# Patient Record
Sex: Female | Born: 1950
Health system: Southern US, Community
[De-identification: ages and names within clinical notes are randomized; demographics above are authoritative.]

## PROBLEM LIST (undated history)

## (undated) DIAGNOSIS — E079 Disorder of thyroid, unspecified: Secondary | ICD-10-CM

## (undated) DIAGNOSIS — R51 Headache: Secondary | ICD-10-CM

## (undated) DIAGNOSIS — L111 Transient acantholytic dermatosis [Grover]: Secondary | ICD-10-CM

## (undated) DIAGNOSIS — F32A Depression, unspecified: Secondary | ICD-10-CM

## (undated) DIAGNOSIS — I214 Non-ST elevation (NSTEMI) myocardial infarction: Secondary | ICD-10-CM

## (undated) DIAGNOSIS — R0600 Dyspnea, unspecified: Secondary | ICD-10-CM

## (undated) DIAGNOSIS — C801 Malignant (primary) neoplasm, unspecified: Secondary | ICD-10-CM

## (undated) DIAGNOSIS — M1990 Unspecified osteoarthritis, unspecified site: Secondary | ICD-10-CM

## (undated) DIAGNOSIS — J189 Pneumonia, unspecified organism: Secondary | ICD-10-CM

## (undated) DIAGNOSIS — T7840XA Allergy, unspecified, initial encounter: Secondary | ICD-10-CM

## (undated) DIAGNOSIS — R519 Headache, unspecified: Secondary | ICD-10-CM

## (undated) DIAGNOSIS — Z8669 Personal history of other diseases of the nervous system and sense organs: Secondary | ICD-10-CM

## (undated) DIAGNOSIS — K5901 Slow transit constipation: Secondary | ICD-10-CM

## (undated) DIAGNOSIS — F419 Anxiety disorder, unspecified: Secondary | ICD-10-CM

## (undated) DIAGNOSIS — F329 Major depressive disorder, single episode, unspecified: Secondary | ICD-10-CM

## (undated) DIAGNOSIS — I2510 Atherosclerotic heart disease of native coronary artery without angina pectoris: Secondary | ICD-10-CM

## (undated) DIAGNOSIS — G2581 Restless legs syndrome: Secondary | ICD-10-CM

## (undated) DIAGNOSIS — D649 Anemia, unspecified: Secondary | ICD-10-CM

## (undated) HISTORY — DX: Allergy, unspecified, initial encounter: T78.40XA

## (undated) HISTORY — PX: BREAST SURGERY: SHX581

## (undated) HISTORY — PX: AUGMENTATION MAMMAPLASTY: SUR837

## (undated) HISTORY — DX: Depression, unspecified: F32.A

## (undated) HISTORY — PX: CORONARY ANGIOPLASTY: SHX604

## (undated) HISTORY — PX: APPENDECTOMY: SHX54

## (undated) HISTORY — DX: Transient acantholytic dermatosis (grover): L11.1

## (undated) HISTORY — DX: Personal history of other diseases of the nervous system and sense organs: Z86.69

## (undated) HISTORY — PX: BACK SURGERY: SHX140

## (undated) HISTORY — DX: Major depressive disorder, single episode, unspecified: F32.9

## (undated) HISTORY — DX: Disorder of thyroid, unspecified: E07.9

## (undated) HISTORY — PX: COLONOSCOPY: SHX174

---

## 2008-04-27 ENCOUNTER — Encounter: Admission: RE | Admit: 2008-04-27 | Discharge: 2008-04-27 | Payer: Self-pay | Admitting: Sports Medicine

## 2008-05-10 ENCOUNTER — Encounter: Admission: RE | Admit: 2008-05-10 | Discharge: 2008-05-10 | Payer: Self-pay | Admitting: Sports Medicine

## 2008-09-21 ENCOUNTER — Encounter: Admission: RE | Admit: 2008-09-21 | Discharge: 2008-09-21 | Payer: Self-pay | Admitting: Sports Medicine

## 2009-05-27 ENCOUNTER — Encounter: Admission: RE | Admit: 2009-05-27 | Discharge: 2009-05-27 | Payer: Self-pay | Admitting: Orthopedic Surgery

## 2009-06-10 ENCOUNTER — Encounter: Admission: RE | Admit: 2009-06-10 | Discharge: 2009-06-10 | Payer: Self-pay | Admitting: Orthopedic Surgery

## 2009-08-24 ENCOUNTER — Encounter: Admission: RE | Admit: 2009-08-24 | Discharge: 2009-08-24 | Payer: Self-pay | Admitting: Orthopedic Surgery

## 2010-03-02 ENCOUNTER — Encounter: Admission: RE | Admit: 2010-03-02 | Discharge: 2010-03-02 | Payer: Self-pay | Admitting: Orthopedic Surgery

## 2011-02-08 ENCOUNTER — Other Ambulatory Visit: Payer: Self-pay | Admitting: Orthopedic Surgery

## 2011-02-08 DIAGNOSIS — M545 Low back pain: Secondary | ICD-10-CM

## 2011-02-13 ENCOUNTER — Other Ambulatory Visit: Payer: Self-pay

## 2011-02-14 ENCOUNTER — Ambulatory Visit
Admission: RE | Admit: 2011-02-14 | Discharge: 2011-02-14 | Disposition: A | Discharge status: Home / Self Care | Payer: BC Managed Care – PPO | Source: Ambulatory Visit | Attending: Orthopedic Surgery | Admitting: Orthopedic Surgery

## 2011-02-14 DIAGNOSIS — M545 Low back pain: Secondary | ICD-10-CM

## 2011-02-27 ENCOUNTER — Other Ambulatory Visit: Payer: Self-pay | Admitting: Orthopedic Surgery

## 2011-02-27 DIAGNOSIS — M545 Low back pain: Secondary | ICD-10-CM

## 2011-03-05 ENCOUNTER — Ambulatory Visit
Admission: RE | Admit: 2011-03-05 | Discharge: 2011-03-05 | Disposition: A | Discharge status: Home / Self Care | Payer: BC Managed Care – PPO | Source: Ambulatory Visit | Attending: Orthopedic Surgery | Admitting: Orthopedic Surgery

## 2011-03-05 DIAGNOSIS — M545 Low back pain: Secondary | ICD-10-CM

## 2011-10-26 ENCOUNTER — Ambulatory Visit: Payer: BC Managed Care – PPO | Admitting: Family Medicine

## 2011-11-08 ENCOUNTER — Ambulatory Visit (INDEPENDENT_AMBULATORY_CARE_PROVIDER_SITE_OTHER): Payer: BC Managed Care – PPO | Admitting: Family Medicine

## 2011-11-08 ENCOUNTER — Encounter: Payer: Self-pay | Admitting: Family Medicine

## 2011-11-08 ENCOUNTER — Other Ambulatory Visit (HOSPITAL_COMMUNITY)
Admission: RE | Admit: 2011-11-08 | Discharge: 2011-11-08 | Disposition: A | Discharge status: Home / Self Care | Payer: BC Managed Care – PPO | Source: Ambulatory Visit | Attending: Family Medicine | Admitting: Family Medicine

## 2011-11-08 VITALS — BP 136/90 | Temp 98.2°F | Wt 152.0 lb

## 2011-11-08 DIAGNOSIS — Z01419 Encounter for gynecological examination (general) (routine) without abnormal findings: Secondary | ICD-10-CM

## 2011-11-08 DIAGNOSIS — Z0000 Encounter for general adult medical examination without abnormal findings: Secondary | ICD-10-CM

## 2011-11-08 DIAGNOSIS — F329 Major depressive disorder, single episode, unspecified: Secondary | ICD-10-CM

## 2011-11-08 DIAGNOSIS — E270 Other adrenocortical overactivity: Secondary | ICD-10-CM

## 2011-11-08 DIAGNOSIS — F32A Depression, unspecified: Secondary | ICD-10-CM

## 2011-11-08 DIAGNOSIS — F419 Anxiety disorder, unspecified: Secondary | ICD-10-CM

## 2011-11-08 DIAGNOSIS — F411 Generalized anxiety disorder: Secondary | ICD-10-CM

## 2011-11-08 NOTE — Progress Notes (Signed)
Subjective:    Patient ID: Monique Miller, female    DOB: 25-Nov-1950, 60 y.o.   MRN: [id number]  HPI  This is a routine physical examination for this healthy  Female. Reviewed all health maintenance protocols including mammography colonoscopy bone density and reviewed appropriate screening labs. Her immunization history was reviewed as well as her current medications and allergies refills of her chronic medications were given and the plan for yearly health maintenance was discussed all orders and referrals were made as appropriate.   Review of Systems  Genitourinary:       Vaginal dryness  Skin:       Florian Buff disease  Psychiatric/Behavioral: The patient is nervous/anxious.   All other systems reviewed and are negative.   Past Medical History  Diagnosis Date  . Depression     see [facility], production and Colen Darling  . Allergy   . Hx of migraines   . Thyroid disease     hx of hypothyroid  . Grover's disease     History   Social History  . Marital Status: Married    Spouse Name: N/A    Number of Children: N/A  . Years of Education: N/A   Occupational History  . Not on file.   Social History Main Topics  . Smoking status: Never Smoker   . Smokeless tobacco: Not on file  . Alcohol Use: Yes     occassionally  . Drug Use: No  . Sexually Active: Not on file   Other Topics Concern  . Not on file   Social History Narrative  . No narrative on file    Past Surgical History  Procedure Date  . Cesarean section     x2  . Appendectomy   . Breast surgery     augmentation    Family History  Problem Relation Age of Onset  . Cancer Mother     pancreatic  . Cancer Maternal Uncle     pancreatic  . Cancer Maternal Grandmother     pancreatic    No Known Allergies  No current outpatient prescriptions on file prior to visit.    BP 136/90  Temp(Src) 98.2 F (36.8 C) (Oral)  Wt 152 lb (68.947 kg)chart     Objective:   Physical Exam  Constitutional: She is oriented  to person, place, and time. She appears well-developed and well-nourished.  HENT:  Head: Normocephalic and atraumatic.  Left Ear: External ear normal.  Nose: Nose normal.  Mouth/Throat: Oropharynx is clear and moist.  Eyes: Conjunctivae and EOM are normal. Pupils are equal, round, and reactive to light.  Neck: Normal range of motion. Neck supple.  Cardiovascular: Normal rate, regular rhythm, normal heart sounds and intact distal pulses.   Pulmonary/Chest: Effort normal and breath sounds normal.  Abdominal: Soft. Bowel sounds are normal.  Genitourinary: Vagina normal and uterus normal. Guaiac negative stool.  Musculoskeletal: Normal range of motion.  Neurological: She is alert and oriented to person, place, and time. She has normal reflexes.  Skin: Skin is warm and dry.  Psychiatric: She has a normal mood and affect.          Assessment & Plan:  Assessment: Complete physical exam, anxiety, Pap smear, Schroeder's disease  Plan: Patient scheduled for mammogram. Pap smear completed and sent. Fasting labs to be obtained in the morning to include CMP, lipids, CBC, and TSH. Colonoscopy was about 5 years ago. She is to return in 5 years. Bone density scan ordered. Encouraged healthy diet and  exercise, self breast exams, uses seatbelts, and handling safety. Patient will return for recheck in 3-4 months and sooner when necessary

## 2011-11-08 NOTE — Patient Instructions (Signed)
1. Return tomorrow for fasting labs. Atrophic Vaginitis Atrophic vaginitis is a problem of low levels of estrogen in women. This problem can happen at any age. It is most common in women who have gone through menopause ("the change").  HOW WILL I KNOW IF I HAVE THIS PROBLEM? You may have:  Trouble with peeing (urinating), such as:   Going to the bathroom often.   A hard time holding your pee until you reach a bathroom.   Leaking pee.   Having pain when you pee.   Itching or a burning feeling.   Vaginal bleeding and spotting.   Pain during sex.   Dryness of the vagina.   A yellow, bad-smelling fluid (discharge) coming from the vagina.  HOW WILL MY DOCTOR CHECK FOR THIS PROBLEM?  During your exam, your doctor will likely find the problem.   If there is a vaginal fluid, it may be checked for infection.  HOW WILL THIS PROBLEM BE TREATED? Keep the vulvar skin as clean as possible. Moisturizers and lubricants can help with some of the symptoms. Estrogen replacement can help. There are 2 ways to take estrogen:  Systemic estrogen gets estrogen to your whole body. It takes many weeks or months before the symptoms get better.   You take an estrogen pill.   You use a skin patch. This is a patch that you put on your skin.   If you still have your uterus, your doctor may ask you to take a hormone. Talk to your doctor about the right medicine for you.   Estrogen cream.  This puts estrogen only at the part of your body where you apply it. The cream is put into the vagina or put on the vulvar skin. For some women, estrogen cream works faster than pills or the patch.  CAN ALL WOMEN WITH THIS PROBLEM USE ESTROGEN? No. Women with certain types of cancer, liver problems, or problems with blood clots should not take estrogen. Your doctor can help you decide the best treatment for your symptoms. Document Released: 04/16/2008 Document Revised: 07/12/2011 Document Reviewed: 04/16/2008 [facility]  Patient Information 2012 [place], [place].

## 2011-11-09 ENCOUNTER — Other Ambulatory Visit: Payer: BC Managed Care – PPO

## 2011-11-09 ENCOUNTER — Ambulatory Visit (INDEPENDENT_AMBULATORY_CARE_PROVIDER_SITE_OTHER)
Admission: RE | Admit: 2011-11-09 | Discharge: 2011-11-09 | Disposition: A | Discharge status: Home / Self Care | Payer: BC Managed Care – PPO | Source: Ambulatory Visit

## 2011-11-09 DIAGNOSIS — F329 Major depressive disorder, single episode, unspecified: Secondary | ICD-10-CM

## 2011-11-09 DIAGNOSIS — F419 Anxiety disorder, unspecified: Secondary | ICD-10-CM

## 2011-11-09 DIAGNOSIS — E270 Other adrenocortical overactivity: Secondary | ICD-10-CM

## 2011-11-09 DIAGNOSIS — M810 Age-related osteoporosis without current pathological fracture: Secondary | ICD-10-CM

## 2011-11-09 DIAGNOSIS — Z0000 Encounter for general adult medical examination without abnormal findings: Secondary | ICD-10-CM

## 2011-11-09 LAB — COMPREHENSIVE METABOLIC PANEL
Albumin: 4.1 g/dL (ref 3.5–5.2)
CO2: 29 mEq/L (ref 19–32)
Calcium: 8.7 mg/dL (ref 8.4–10.5)
Chloride: 101 mEq/L (ref 96–112)
GFR: 52.01 mL/min — ABNORMAL LOW (ref >60.00)
Glucose, Bld: 80 mg/dL (ref 70–99)
Potassium: 4.7 mEq/L (ref 3.5–5.1)

## 2011-11-09 LAB — POCT URINALYSIS DIPSTICK
Bilirubin, UA: NEGATIVE
Blood, UA: NEGATIVE
Ketones, UA: NEGATIVE
Protein, UA: NEGATIVE
Spec Grav, UA: 1.01
Urobilinogen, UA: 0.2
pH, UA: 6.5

## 2011-11-09 LAB — LIPID PANEL: HDL: 68.7 mg/dL (ref >39.00)

## 2011-11-09 LAB — CBC WITH DIFFERENTIAL/PLATELET
Basophils Relative: 0.5 % (ref 0.0–3.0)
HCT: 38.8 % (ref 36.0–46.0)
Hemoglobin: 12.8 g/dL (ref 12.0–15.0)
Lymphs Abs: 3.9 10*3/uL (ref 0.7–4.0)
Monocytes Relative: 9.4 % (ref 3.0–12.0)
Neutro Abs: 4.8 10*3/uL (ref 1.4–7.7)
Neutrophils Relative %: 48.5 % (ref 43.0–77.0)
WBC: 9.9 10*3/uL (ref 4.5–10.5)

## 2011-11-09 LAB — TSH: TSH: 3.39 u[IU]/mL (ref 0.35–5.50)

## 2011-11-09 NOTE — Progress Notes (Signed)
Addended by: Bonnye Fava on: 11/09/2011 08:47 AM   Modules accepted: Orders

## 2011-11-23 ENCOUNTER — Encounter: Payer: Self-pay | Admitting: Family

## 2011-11-26 ENCOUNTER — Telehealth: Payer: Self-pay | Admitting: *Deleted

## 2011-11-26 NOTE — Telephone Encounter (Signed)
Please call pt with all her lab results from December, please.

## 2011-11-27 NOTE — Telephone Encounter (Signed)
Please call with her lab results

## 2011-11-28 ENCOUNTER — Telehealth: Payer: Self-pay | Admitting: Family

## 2011-11-28 ENCOUNTER — Telehealth: Payer: Self-pay | Admitting: *Deleted

## 2011-11-28 NOTE — Telephone Encounter (Signed)
Pt aware and verbalized understanding.  

## 2011-11-28 NOTE — Telephone Encounter (Signed)
Pt is calling back for all her lab and bone scan results again.

## 2011-11-28 NOTE — Telephone Encounter (Signed)
Per Oran Rein, advise pt labs are stable. Pt should focus on healthy diet and exercise and f/u for CPX in 1 year.  Pt aware and verbalized understanding. Copy of labs mailed to pt.  Pt requesting DEXA results. Will have Padonda review report

## 2011-11-28 NOTE — Telephone Encounter (Signed)
Done

## 2011-11-28 NOTE — Telephone Encounter (Signed)
Patient is requesting DEXA scan results. She is considered Osteopenic. Starts 800units of Vitamin D and 1500mg  of Calcium. Exercise daily.

## 2011-12-26 ENCOUNTER — Ambulatory Visit (INDEPENDENT_AMBULATORY_CARE_PROVIDER_SITE_OTHER): Payer: BC Managed Care – PPO | Admitting: Family

## 2011-12-26 ENCOUNTER — Telehealth: Payer: Self-pay

## 2011-12-26 ENCOUNTER — Encounter: Payer: Self-pay | Admitting: Family

## 2011-12-26 VITALS — BP 136/90 | HR 83 | Temp 100.0°F | Wt 152.0 lb

## 2011-12-26 DIAGNOSIS — R0982 Postnasal drip: Secondary | ICD-10-CM

## 2011-12-26 DIAGNOSIS — J019 Acute sinusitis, unspecified: Secondary | ICD-10-CM

## 2011-12-26 MED ORDER — ESTRADIOL 0.1 MG/GM VA CREA: TOPICAL_CREAM | VAGINAL | Status: DC

## 2011-12-26 MED ORDER — AMOXICILLIN-POT CLAVULANATE 875-125 MG PO TABS: 1.0000 | ORAL_TABLET | Freq: Two times a day (BID) | ORAL | Status: AC

## 2011-12-26 NOTE — Patient Instructions (Signed)

## 2011-12-26 NOTE — Telephone Encounter (Signed)
Spoke with pharmacist at [facility]. Pt profile indicated that she received estrace vaginal cream to be applied at bedtime twice weekly. Verbal refill request given

## 2011-12-26 NOTE — Progress Notes (Signed)
Subjective:    Patient ID: Monique Miller, female    DOB: 1951-09-23, 61 y.o.   MRN: [id number]  HPI 61 year old white female, nonsmoker, presents today with complaints of fever, chills, nasal congestion, headache, sneezing and runny nose has been going on for 2 weeks, and worsening. She taken over-the-counter Tylenol Cold and sinus that is no longer helping her symptoms. She denies any lightheadedness, dizziness, chest pain, palpitations, shortness of breath or edema.  Patient is requesting a refill on medication for vaginal dryness. Medication works well.   Review of Systems  Constitutional: Positive for fever, chills and fatigue.  HENT: Positive for congestion, rhinorrhea, sneezing, postnasal drip and sinus pressure.   Eyes: Negative.   Respiratory: Positive for cough.   Cardiovascular: Negative.   Gastrointestinal: Negative.   Musculoskeletal: Negative.   Skin: Negative.   Neurological: Negative.   Hematological: Negative.   Psychiatric/Behavioral: Negative.        Past Medical History  Diagnosis Date  . Depression     see [facility], production and Colen Darling  . Allergy   . Hx of migraines   . Thyroid disease     hx of hypothyroid  . Grover's disease     History   Social History  . Marital Status: Married    Spouse Name: N/A    Number of Children: N/A  . Years of Education: N/A   Occupational History  . Not on file.   Social History Main Topics  . Smoking status: Never Smoker   . Smokeless tobacco: Not on file  . Alcohol Use: Yes     occassionally  . Drug Use: No  . Sexually Active: Not on file   Other Topics Concern  . Not on file   Social History Narrative  . No narrative on file    Past Surgical History  Procedure Date  . Cesarean section     x2  . Appendectomy   . Breast surgery     augmentation    Family History  Problem Relation Age of Onset  . Cancer Mother     pancreatic  . Cancer Maternal Uncle     pancreatic  . Cancer Maternal  Grandmother     pancreatic    No Known Allergies  Current Outpatient Prescriptions on File Prior to Visit  Medication Sig Dispense Refill  . ALPRAZolam (XANAX) 1 MG tablet Take 1 mg by mouth at bedtime as needed.       Marland Kitchen amphetamine-dextroamphetamine (ADDERALL) 15 MG tablet Take by mouth. 1 tablet 2-3 times daily      . buPROPion (WELLBUTRIN XL) 300 MG 24 hr tablet Take 300 mg by mouth daily.       Marland Kitchen CORDRAN 0.05 % lotion Apply topically 2 (two) times daily.       . CYMBALTA 60 MG capsule Take 60 mg by mouth daily.       Marland Kitchen glucosamine-chondroitin 500-400 MG tablet Take 1 tablet by mouth 2 (two) times daily.        . hydrOXYzine (ATARAX/VISTARIL) 25 MG tablet Take 25 mg by mouth at bedtime.       . predniSONE (DELTASONE) 10 MG tablet         BP 136/90  Pulse 83  Temp(Src) 100 F (37.8 C) (Oral)  Wt 152 lb (68.947 kg)  SpO2 98%chart Objective:   Physical Exam  Constitutional: She is oriented to person, place, and time. She appears well-developed and well-nourished.  HENT:  Right Ear: External ear normal.  Left Ear: External ear normal.  Nose: Nose normal.  Mouth/Throat: Oropharynx is clear and moist.       Maxillary sinus tenderness to palpation.  Neck: Normal range of motion. Neck supple.  Cardiovascular: Normal rate, regular rhythm and normal heart sounds.   Pulmonary/Chest: Effort normal and breath sounds normal.  Musculoskeletal: Normal range of motion.  Neurological: She is alert and oriented to person, place, and time.  Skin: Skin is warm and dry.  Psychiatric: She has a normal mood and affect.          Assessment & Plan:  Assessment: Acute sinusitis, vaginal dryness  Plan: We'll call the pharmacy and find out what the medication is that she had taken in the past for vaginal dryness of her new it. Augmentin 875 one by mouth twice a day x10 days. Claritin over-the-counter daily. On the opposite symptoms worsen or persist. Recheck as scheduled and when necessary.

## 2012-05-08 ENCOUNTER — Telehealth: Payer: Self-pay | Admitting: Family

## 2012-05-08 NOTE — Telephone Encounter (Signed)
Caller: Edy/Patient; PCP: Adline Mango; CB#: [phone];  Call regarding Urinary Pain/Frequency.  Onset: 05/06/12.  Afebrile.  Menopausal.  May use otc Azo Standard or equilivant.  Advised to see MD within 24 hrs for one or more urinary tract symptoms and has not been evaluated per Urinary Symptoms Guideline.  Appt scheduled for 0800 05/09/12 with Dr Amador Cunas.

## 2012-05-08 NOTE — Telephone Encounter (Signed)
noted 

## 2012-05-09 ENCOUNTER — Encounter: Payer: Self-pay | Admitting: Internal Medicine

## 2012-05-09 ENCOUNTER — Ambulatory Visit (INDEPENDENT_AMBULATORY_CARE_PROVIDER_SITE_OTHER): Payer: BC Managed Care – PPO | Admitting: Internal Medicine

## 2012-05-09 VITALS — BP 110/70 | Temp 98.2°F | Wt 150.0 lb

## 2012-05-09 DIAGNOSIS — N390 Urinary tract infection, site not specified: Secondary | ICD-10-CM

## 2012-05-09 DIAGNOSIS — R300 Dysuria: Secondary | ICD-10-CM

## 2012-05-09 LAB — POCT URINALYSIS DIPSTICK
Blood, UA: 3
Nitrite, UA: POSITIVE
Spec Grav, UA: 1.02
Urobilinogen, UA: 2
pH, UA: 7.5

## 2012-05-09 MED ORDER — NITROFURANTOIN MONOHYD MACRO 100 MG PO CAPS: 100.0000 mg | ORAL_CAPSULE | Freq: Two times a day (BID) | ORAL | Status: AC

## 2012-05-09 NOTE — Progress Notes (Signed)
  Subjective:    Patient ID: Monique Miller, female    DOB: [birth year], 61 y.o.   MRN: [id number]  HPI  61 year old patient who presents with a three-day history of burning dysuria or frequency. She has felt a bit unwell with some low back pain and a sense of a low-grade fever. No flank pain or frank chills. She has been using over-the-counter azo  without much benefit. No recent antibiotic use    Review of Systems  Genitourinary: Positive for dysuria, urgency, frequency and difficulty urinating. Negative for hematuria, flank pain and genital sores.       Objective:   Physical Exam  Constitutional: She appears well-developed and well-nourished. No distress.       Temperature 98.2  Psychiatric: She has a normal mood and affect. Her behavior is normal.          Assessment & Plan:   Acute UTI We'll treat with Macrodantin for 7 days

## 2012-05-09 NOTE — Patient Instructions (Signed)
Drink as much fluid as you  can tolerate over the next few days  Take your antibiotic as prescribed until ALL of it is gone, but stop if you develop a rash, swelling, or any side effects of the medication.  Contact our office as soon as possible if  there are side effects of the medication.  Call or return to clinic prn if these symptoms worsen or fail to improve as anticipated.   

## 2012-06-05 ENCOUNTER — Encounter: Payer: Self-pay | Admitting: Family

## 2012-06-05 ENCOUNTER — Ambulatory Visit (INDEPENDENT_AMBULATORY_CARE_PROVIDER_SITE_OTHER): Payer: BC Managed Care – PPO | Admitting: Family

## 2012-06-05 VITALS — BP 130/80 | HR 85 | Temp 97.8°F

## 2012-06-05 DIAGNOSIS — L918 Other hypertrophic disorders of the skin: Secondary | ICD-10-CM

## 2012-06-05 DIAGNOSIS — L909 Atrophic disorder of skin, unspecified: Secondary | ICD-10-CM

## 2012-06-05 DIAGNOSIS — Z4802 Encounter for removal of sutures: Secondary | ICD-10-CM

## 2012-06-05 DIAGNOSIS — L908 Other atrophic disorders of skin: Secondary | ICD-10-CM

## 2012-06-05 NOTE — Progress Notes (Signed)
Subjective:    Patient ID: Monique Miller, female    DOB: 07-30-1951, 61 y.o.   MRN: [id number]  HPI 61 year old white female, nonsmoker is in for suture removal. She has 2 sutures to her left index finger. Denies any pain. Patient also has concerns of a dimpling to her left buttocks. Reports have been present for about 5 or 6 months, but getting worse. Denies any known cause.   Review of Systems  Constitutional: Negative.   Respiratory: Negative.   Cardiovascular: Negative.   Musculoskeletal: Negative.   Skin: Negative.        2 sutures to the left index finger. Patient has a dimpling area to the left buttocks.   Hematological: Negative.   Psychiatric/Behavioral: Negative.    Past Medical History  Diagnosis Date  . Depression     see [facility], production and Colen Darling  . Allergy   . Hx of migraines   . Thyroid disease     hx of hypothyroid  . Grover's disease     History   Social History  . Marital Status: Married    Spouse Name: N/A    Number of Children: N/A  . Years of Education: N/A   Occupational History  . Not on file.   Social History Main Topics  . Smoking status: Never Smoker   . Smokeless tobacco: Not on file  . Alcohol Use: Yes     occassionally  . Drug Use: No  . Sexually Active: Not on file   Other Topics Concern  . Not on file   Social History Narrative  . No narrative on file    Past Surgical History  Procedure Date  . Cesarean section     x2  . Appendectomy   . Breast surgery     augmentation    Family History  Problem Relation Age of Onset  . Cancer Mother     pancreatic  . Cancer Maternal Uncle     pancreatic  . Cancer Maternal Grandmother     pancreatic    No Known Allergies  Current Outpatient Prescriptions on File Prior to Visit  Medication Sig Dispense Refill  . ALPRAZolam (XANAX) 1 MG tablet Take 1 mg by mouth at bedtime as needed.       Marland Kitchen amphetamine-dextroamphetamine (ADDERALL) 15 MG tablet Take by mouth. 1 tablet 2-3  times daily      . buPROPion (WELLBUTRIN XL) 300 MG 24 hr tablet Take 300 mg by mouth daily.       Marland Kitchen CORDRAN 0.05 % lotion Apply topically 2 (two) times daily.       . CYMBALTA 60 MG capsule Take 60 mg by mouth daily.       Marland Kitchen estradiol (ESTRACE VAGINAL) 0.1 MG/GM vaginal cream Use at bedtime two time per week  42.5 g  0  . glucosamine-chondroitin 500-400 MG tablet Take 1 tablet by mouth 2 (two) times daily.          BP 130/80  Pulse 85  Temp 97.8 F (36.6 C) (Oral)  SpO2 98%chart    Objective:   Physical Exam  Constitutional: She is oriented to person, place, and time. She appears well-developed and well-nourished.  Cardiovascular: Normal rate, regular rhythm and normal heart sounds.   Pulmonary/Chest: Effort normal and breath sounds normal.  Neurological: She is alert and oriented to person, place, and time.  Skin: Skin is warm and dry.       Sutures removed from the left index finger. Edges  well approximated.  Atrophy noted to the left gluteal area approximately the size of a quarter.   Psychiatric: She has a normal mood and affect.          Assessment & Plan:  Assessment: Suture removal, skin atrophy  Plan: Atrophy on the left gluteal area appears to possibly be a result of an injection of some sort but patient denies.  Offered referral to plastic surgery for consult, patient declined. Recheck as scheduled and as needed.

## 2012-07-16 ENCOUNTER — Encounter: Payer: Self-pay | Admitting: Internal Medicine

## 2012-07-16 ENCOUNTER — Ambulatory Visit (INDEPENDENT_AMBULATORY_CARE_PROVIDER_SITE_OTHER): Payer: BC Managed Care – PPO | Admitting: Internal Medicine

## 2012-07-16 ENCOUNTER — Telehealth: Payer: Self-pay | Admitting: Family

## 2012-07-16 VITALS — BP 146/94 | Temp 98.2°F

## 2012-07-16 DIAGNOSIS — J069 Acute upper respiratory infection, unspecified: Secondary | ICD-10-CM | POA: Insufficient documentation

## 2012-07-16 DIAGNOSIS — J029 Acute pharyngitis, unspecified: Secondary | ICD-10-CM

## 2012-07-16 NOTE — Patient Instructions (Addendum)
Use mucinex over the counter twice daily Gargle with warm salt water 2-3 times per day Get plenty of rest and fluids Please call our office if your symptoms do not improve or gets worse.

## 2012-07-16 NOTE — Assessment & Plan Note (Signed)
61 year old white female with signs and symptoms consistent with viral URI. I suggested symptomatic treatment.  Patient advised to call office if symptoms persist or worsen.

## 2012-07-16 NOTE — Progress Notes (Signed)
Subjective:    Patient ID: Monique Miller, female    DOB: 21-Mar-1951, 61 y.o.   MRN: [id number]  Sore Throat  This is a new problem. The current episode started yesterday. The problem has been gradually worsening. Neither side of throat is experiencing more pain than the other. There has been no fever. The pain is mild. Associated symptoms include congestion. Pertinent negatives include no coughing, shortness of breath or swollen glands.   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  Patient reports feeling achy.    Review of Systems  HENT: Positive for congestion.   Respiratory: Negative for cough and shortness of breath.    Past Medical History  Diagnosis Date  . Depression     see [facility], production and Colen Darling  . Allergy   . Hx of migraines   . Thyroid disease     hx of hypothyroid  . Grover's disease     History   Social History  . Marital Status: Married    Spouse Name: N/A    Number of Children: N/A  . Years of Education: N/A   Occupational History  . Not on file.   Social History Main Topics  . Smoking status: Never Smoker   . Smokeless tobacco: Not on file  . Alcohol Use: Yes     occassionally  . Drug Use: No  . Sexually Active: Not on file   Other Topics Concern  . Not on file   Social History Narrative  . No narrative on file    Past Surgical History  Procedure Date  . Cesarean section     x2  . Appendectomy   . Breast surgery     augmentation    Family History  Problem Relation Age of Onset  . Cancer Mother     pancreatic  . Cancer Maternal Uncle     pancreatic  . Cancer Maternal Grandmother     pancreatic    No Known Allergies  Current Outpatient Prescriptions on File Prior to Visit  Medication Sig Dispense Refill  . ALPRAZolam (XANAX) 1 MG tablet Take 1 mg by mouth at bedtime as needed.       Marland Kitchen amphetamine-dextroamphetamine (ADDERALL) 15 MG tablet Take by mouth. 1 tablet 2-3 times daily      .  buPROPion (WELLBUTRIN XL) 300 MG 24 hr tablet Take 300 mg by mouth daily.       Marland Kitchen CORDRAN 0.05 % lotion Apply topically 2 (two) times daily.       . CYMBALTA 60 MG capsule Take 60 mg by mouth daily.       Marland Kitchen estradiol (ESTRACE VAGINAL) 0.1 MG/GM vaginal cream Use at bedtime two time per week  42.5 g  0  . glucosamine-chondroitin 500-400 MG tablet Take 1 tablet by mouth 2 (two) times daily.          BP 146/94  Temp 98.2 F (36.8 C) (Oral)       Objective:   Physical Exam  Constitutional: She appears well-developed and well-nourished. No distress.  HENT:  Head: Normocephalic and atraumatic.  Right Ear: External ear normal.  Left Ear: External ear normal.  Mouth/Throat: No oropharyngeal exudate.       Oropharyngeal erythema  Cardiovascular: Normal rate, regular rhythm and normal heart sounds.   No murmur heard. Pulmonary/Chest: Effort normal and breath sounds normal. She has no wheezes.  Lymphadenopathy:    She has no cervical adenopathy.  Assessment & Plan:

## 2012-07-16 NOTE — Telephone Encounter (Signed)
Appt is w/ Dr Artist Pais

## 2012-07-16 NOTE — Telephone Encounter (Signed)
Patient calling, has a sore throat x 24 hours.  No fever.  Has a slight cough and congestion.  Asking for an appointment, wanting medication so she will not miss any more work.  Triaged per Sore Throat protocal.  Scheduled for 4p with Dr. Cato Mulligan.

## 2012-09-16 ENCOUNTER — Telehealth: Payer: Self-pay | Admitting: Family Medicine

## 2012-09-16 NOTE — Telephone Encounter (Signed)
Caller: Debby/Patient; Patient Name: Monique Miller; PCP: Evelena Peat [facility]); Best Callback Phone Number: [phone];  Calling regarding frequent urination and 2 drops of blood  was noted x 1, onset 09/16/12, unsure if came from vagina/rectum. Had had 3-4 loose stools, onset 09/16/12. Thinks came from her vagina. No blood in stools reported, afebrile. Had some UTI symptoms earlier this am but have resolved. Emergent signs and symptoms ruled out as per Bloody Urine protocol except for see in 2 weeks due to any blood in urine, call back parameters given.

## 2012-09-19 ENCOUNTER — Ambulatory Visit: Payer: BC Managed Care – PPO | Admitting: Family

## 2012-10-01 ENCOUNTER — Ambulatory Visit (INDEPENDENT_AMBULATORY_CARE_PROVIDER_SITE_OTHER): Payer: BC Managed Care – PPO | Admitting: Family

## 2012-10-01 ENCOUNTER — Encounter: Payer: Self-pay | Admitting: Family

## 2012-10-01 VITALS — BP 118/76 | HR 77 | Temp 98.6°F

## 2012-10-01 DIAGNOSIS — R300 Dysuria: Secondary | ICD-10-CM

## 2012-10-01 DIAGNOSIS — F419 Anxiety disorder, unspecified: Secondary | ICD-10-CM

## 2012-10-01 DIAGNOSIS — N390 Urinary tract infection, site not specified: Secondary | ICD-10-CM

## 2012-10-01 DIAGNOSIS — N959 Unspecified menopausal and perimenopausal disorder: Secondary | ICD-10-CM

## 2012-10-01 DIAGNOSIS — F988 Other specified behavioral and emotional disorders with onset usually occurring in childhood and adolescence: Secondary | ICD-10-CM

## 2012-10-01 DIAGNOSIS — F411 Generalized anxiety disorder: Secondary | ICD-10-CM

## 2012-10-01 DIAGNOSIS — F329 Major depressive disorder, single episode, unspecified: Secondary | ICD-10-CM

## 2012-10-01 LAB — POCT URINALYSIS DIPSTICK
Bilirubin, UA: NEGATIVE
Ketones, UA: NEGATIVE
Protein, UA: NEGATIVE
Spec Grav, UA: 1.015
pH, UA: 5

## 2012-10-01 MED ORDER — NITROFURANTOIN MONOHYD MACRO 100 MG PO CAPS: 100.0000 mg | ORAL_CAPSULE | Freq: Two times a day (BID) | ORAL | Status: DC

## 2012-10-02 DIAGNOSIS — F329 Major depressive disorder, single episode, unspecified: Secondary | ICD-10-CM | POA: Insufficient documentation

## 2012-10-02 DIAGNOSIS — F32A Depression, unspecified: Secondary | ICD-10-CM | POA: Insufficient documentation

## 2012-10-02 DIAGNOSIS — F988 Other specified behavioral and emotional disorders with onset usually occurring in childhood and adolescence: Secondary | ICD-10-CM | POA: Insufficient documentation

## 2012-10-02 DIAGNOSIS — N959 Unspecified menopausal and perimenopausal disorder: Secondary | ICD-10-CM | POA: Insufficient documentation

## 2012-10-02 DIAGNOSIS — F419 Anxiety disorder, unspecified: Secondary | ICD-10-CM | POA: Insufficient documentation

## 2012-10-02 NOTE — Progress Notes (Signed)
Subjective:    Patient ID: Monique Miller, female    DOB: 17-May-1951, 61 y.o.   MRN: [id number]  HPI 61 year old white female, nonsmoker, is in today with c/o painful urination, pelvic pressure x 10 days. She was seen at [facility] ED and treated for a UTI 10 days ago and given Cephalexin and Pyridium. Her symptoms improved for a few days but never cleared.    Review of Systems  Constitutional: Negative.   Respiratory: Negative.   Cardiovascular: Negative.   Genitourinary: Positive for dysuria and urgency.  Musculoskeletal: Negative.   Psychiatric/Behavioral: Negative.    Past Medical History  Diagnosis Date  . Depression     see [facility], production and Colen Darling  . Allergy   . Hx of migraines   . Thyroid disease     hx of hypothyroid  . Grover's disease     History   Social History  . Marital Status: Married    Spouse Name: N/A    Number of Children: N/A  . Years of Education: N/A   Occupational History  . Not on file.   Social History Main Topics  . Smoking status: Never Smoker   . Smokeless tobacco: Not on file  . Alcohol Use: Yes     Comment: occassionally  . Drug Use: No  . Sexually Active: Not on file   Other Topics Concern  . Not on file   Social History Narrative  . No narrative on file    Past Surgical History  Procedure Date  . Cesarean section     x2  . Appendectomy   . Breast surgery     augmentation    Family History  Problem Relation Age of Onset  . Cancer Mother     pancreatic  . Cancer Maternal Uncle     pancreatic  . Cancer Maternal Grandmother     pancreatic    No Known Allergies  Current Outpatient Prescriptions on File Prior to Visit  Medication Sig Dispense Refill  . ALPRAZolam (XANAX) 1 MG tablet Take 1 mg by mouth at bedtime as needed.       Marland Kitchen amphetamine-dextroamphetamine (ADDERALL) 15 MG tablet Take by mouth. 1 tablet 2-3 times daily      . buPROPion (WELLBUTRIN XL) 300 MG 24 hr tablet Take 300 mg by mouth daily.        Marland Kitchen CORDRAN 0.05 % lotion Apply topically 2 (two) times daily.       . CYMBALTA 60 MG capsule Take 60 mg by mouth daily.       Marland Kitchen estradiol (ESTRACE VAGINAL) 0.1 MG/GM vaginal cream Use at bedtime two time per week  42.5 g  0  . glucosamine-chondroitin 500-400 MG tablet Take 1 tablet by mouth 2 (two) times daily.          BP 118/76  Pulse 77  Temp 98.6 F (37 C) (Oral)  SpO2 96%chart    Objective:   Physical Exam  Constitutional: She is oriented to person, place, and time. She appears well-developed and well-nourished.  Cardiovascular: Normal rate, regular rhythm and normal heart sounds.   Pulmonary/Chest: Effort normal and breath sounds normal.  Abdominal: Soft. Bowel sounds are normal.  Neurological: She is alert and oriented to person, place, and time.  Skin: Skin is warm and dry.  Psychiatric: She has a normal mood and affect.          Assessment & Plan:  Assessment: UTI, Dysuria  Plan: Bactrim Ds 1 tab  twice a day x 7 days. Urine culture sent. Call the office if symtpoms worsen or persist. Recheck as scheduled and sooner as needed.

## 2013-05-04 ENCOUNTER — Telehealth: Payer: Self-pay

## 2013-05-04 NOTE — Telephone Encounter (Signed)
Per Monique Miller, pt called to c/o the dx Monique Miller has to be removed from her chart because it is preventing her from getting life insurance.  Spoke with pt and she says that she does not have Monique Miller but she does have "Grover's" Miller where she "breaks out" a lot. She would like the dx removed and a letter sent to Marcelino Duster Rymil at [facility] advising that she does not have the Miller. Advised pt that Padonda may not be able to remove dx from chart and she may need to come into the office to discuss.  If letter can be sent, please send to: [facility] [address] [place], [place] [address].  Please advise

## 2013-05-05 NOTE — Telephone Encounter (Signed)
I can do an addendum but cannot remove it. Please right a letter at mentioned below to address patient's needs.

## 2013-05-06 NOTE — Telephone Encounter (Signed)
Note faxed to [facility], per pt request, at [phone]

## 2013-05-12 ENCOUNTER — Ambulatory Visit: Payer: BC Managed Care – PPO | Admitting: Family

## 2013-05-12 ENCOUNTER — Ambulatory Visit (INDEPENDENT_AMBULATORY_CARE_PROVIDER_SITE_OTHER): Payer: BC Managed Care – PPO | Admitting: Family Medicine

## 2013-05-12 ENCOUNTER — Encounter: Payer: Self-pay | Admitting: Family Medicine

## 2013-05-12 VITALS — BP 130/82 | Temp 99.0°F | Wt 157.0 lb

## 2013-05-12 DIAGNOSIS — R300 Dysuria: Secondary | ICD-10-CM

## 2013-05-12 LAB — POCT URINALYSIS DIPSTICK
Bilirubin, UA: NEGATIVE
Glucose, UA: NEGATIVE
Ketones, UA: NEGATIVE
Nitrite, UA: NEGATIVE
pH, UA: 7

## 2013-05-12 MED ORDER — CIPROFLOXACIN HCL 500 MG PO TABS: 500.0000 mg | ORAL_TABLET | Freq: Two times a day (BID) | ORAL | Status: DC

## 2013-05-12 NOTE — Progress Notes (Signed)
Chief Complaint  Patient presents with  . Dysuria    HPI:  Acute visit for dysuria: -started yesterday -symptoms: burning with urination, frequency, urgency -denies: fevers, chills, NVD, flank pain, vaginal symptoms -report hx uti and this feels the same  ROS: See pertinent positives and negatives per HPI.  Past Medical History  Diagnosis Date  . Depression     see [facility], production and Colen Darling  . Allergy   . Hx of migraines   . Thyroid disease     hx of hypothyroid  . Grover's disease     Family History  Problem Relation Age of Onset  . Cancer Mother     pancreatic  . Cancer Maternal Uncle     pancreatic  . Cancer Maternal Grandmother     pancreatic    History   Social History  . Marital Status: Married    Spouse Name: N/A    Number of Children: N/A  . Years of Education: N/A   Social History Main Topics  . Smoking status: Never Smoker   . Smokeless tobacco: None  . Alcohol Use: Yes     Comment: occassionally  . Drug Use: No  . Sexually Active: None   Other Topics Concern  . None   Social History Narrative  . None    Current outpatient prescriptions:ALPRAZolam (XANAX) 1 MG tablet, Take 1 mg by mouth at bedtime as needed. , Disp: , Rfl: ;  amphetamine-dextroamphetamine (ADDERALL) 15 MG tablet, Take by mouth. 1 tablet 2-3 times daily, Disp: , Rfl: ;  buPROPion (WELLBUTRIN XL) 300 MG 24 hr tablet, Take 300 mg by mouth daily. , Disp: , Rfl: ;  CORDRAN 0.05 % lotion, Apply topically 2 (two) times daily. , Disp: , Rfl:  CYMBALTA 60 MG capsule, Take 60 mg by mouth daily. , Disp: , Rfl: ;  estradiol (ESTRACE VAGINAL) 0.1 MG/GM vaginal cream, Use at bedtime two time per week, Disp: 42.5 g, Rfl: 0;  glucosamine-chondroitin 500-400 MG tablet, Take 1 tablet by mouth 2 (two) times daily.  , Disp: , Rfl: ;  nitrofurantoin, macrocrystal-monohydrate, (MACROBID) 100 MG capsule, Take 1 capsule (100 mg total) by mouth 2 (two) times daily., Disp: 14 capsule, Rfl:  0 ciprofloxacin (CIPRO) 500 MG tablet, Take 1 tablet (500 mg total) by mouth 2 (two) times daily., Disp: 6 tablet, Rfl: 0  EXAM:  Filed Vitals:   05/12/13 1058  BP: 130/82  Temp: 99 F (37.2 C)    There is no height on file to calculate BMI.  GENERAL: vitals reviewed and listed above, alert, oriented, appears well hydrated and in no acute distress  HEENT: atraumatic, conjunttiva clear, no obvious abnormalities on inspection of external nose and ears  NECK: no obvious masses on inspection  LUNGS: clear to auscultation bilaterally, no wheezes, rales or rhonchi, good air movement  CV: HRRR, no peripheral edema  ABD: BS+, soft, NTTP, no CVA TTP  MS: moves all extremities without noticeable abnormality  PSYCH: pleasant and cooperative, no obvious depression or anxiety  ASSESSMENT AND PLAN:  Discussed the following assessment and plan:  Dysuria - Plan: POCT urinalysis dipstick, Culture, Urine, ciprofloxacin (CIPRO) 500 MG tablet  -udip with blood and leuks. Discussed could be uti and culture pending. Pt prefers to start empiric abx - risks discussed. Rx sent to pharmacy. Return precautions. -Patient advised to return or notify a doctor immediately if symptoms worsen or persist or new concerns arise.  There are no Patient Instructions on file for this  visit.   Colin Benton R.

## 2013-05-15 LAB — URINE CULTURE: Colony Count: 50000

## 2013-05-18 NOTE — Progress Notes (Signed)
Quick Note:  Called and spoke with pt and pt is aware. ______ 

## 2013-06-10 ENCOUNTER — Telehealth: Payer: Self-pay | Admitting: Family

## 2013-06-10 NOTE — Telephone Encounter (Signed)
Pt states that letter was not received by insurance company. Advised that letter was sent and confirmed with recipient. Pt requested that letter be sent to Mindi Curling at fax # [phone]  Note faxed

## 2013-06-10 NOTE — Telephone Encounter (Signed)
Pt would like tamesha to return her call concerning life insurance issue

## 2013-06-10 NOTE — Telephone Encounter (Signed)
PT called and stated that she needs Tamesha to call her back on her cell phone number. FYI.

## 2013-09-07 ENCOUNTER — Other Ambulatory Visit: Payer: Self-pay

## 2013-09-07 ENCOUNTER — Telehealth: Payer: Self-pay | Admitting: Family

## 2013-09-07 DIAGNOSIS — Z0000 Encounter for general adult medical examination without abnormal findings: Secondary | ICD-10-CM

## 2013-09-07 DIAGNOSIS — Z1231 Encounter for screening mammogram for malignant neoplasm of breast: Secondary | ICD-10-CM

## 2013-09-07 NOTE — Telephone Encounter (Signed)
Opened in error

## 2013-09-18 ENCOUNTER — Encounter: Payer: BC Managed Care – PPO | Admitting: Family

## 2013-09-25 ENCOUNTER — Encounter: Payer: BC Managed Care – PPO | Admitting: Family

## 2013-09-28 ENCOUNTER — Ambulatory Visit
Admission: RE | Admit: 2013-09-28 | Discharge: 2013-09-28 | Disposition: A | Discharge status: Home / Self Care | Payer: BC Managed Care – PPO | Source: Ambulatory Visit

## 2013-09-28 DIAGNOSIS — Z1231 Encounter for screening mammogram for malignant neoplasm of breast: Secondary | ICD-10-CM

## 2013-10-12 ENCOUNTER — Other Ambulatory Visit (INDEPENDENT_AMBULATORY_CARE_PROVIDER_SITE_OTHER): Payer: BC Managed Care – PPO

## 2013-10-12 DIAGNOSIS — Z0000 Encounter for general adult medical examination without abnormal findings: Secondary | ICD-10-CM

## 2013-10-12 LAB — CBC WITH DIFFERENTIAL/PLATELET
Basophils Relative: 0.8 % (ref 0.0–3.0)
Eosinophils Absolute: 0.2 10*3/uL (ref 0.0–0.7)
Eosinophils Relative: 2.6 % (ref 0.0–5.0)
HCT: 42.7 % (ref 36.0–46.0)
Lymphocytes Relative: 25.7 % (ref 12.0–46.0)
MCHC: 33.8 g/dL (ref 30.0–36.0)
Neutrophils Relative %: 62.9 % (ref 43.0–77.0)
Platelets: 246 10*3/uL (ref 150.0–400.0)
RBC: 4.91 Mil/uL (ref 3.87–5.11)
RDW: 13.1 % (ref 11.5–14.6)
WBC: 8.1 10*3/uL (ref 4.5–10.5)

## 2013-10-12 LAB — BASIC METABOLIC PANEL
BUN: 23 mg/dL (ref 6–23)
CO2: 27 mEq/L (ref 19–32)
Calcium: 9.5 mg/dL (ref 8.4–10.5)
Chloride: 101 mEq/L (ref 96–112)
Creatinine, Ser: 1.2 mg/dL (ref 0.4–1.2)
GFR: 51.14 mL/min — ABNORMAL LOW (ref >60.00)
Glucose, Bld: 93 mg/dL (ref 70–99)

## 2013-10-12 LAB — HEPATIC FUNCTION PANEL
ALT: 18 U/L (ref 0–35)
AST: 20 U/L (ref 0–37)
Albumin: 4.2 g/dL (ref 3.5–5.2)
Total Protein: 7.1 g/dL (ref 6.0–8.3)

## 2013-10-12 LAB — LIPID PANEL
Cholesterol: 189 mg/dL (ref 0–200)
HDL: 54.9 mg/dL
LDL Cholesterol: 112 mg/dL — ABNORMAL HIGH (ref 0–99)
Total CHOL/HDL Ratio: 3
Triglycerides: 111 mg/dL (ref 0.0–149.0)
VLDL: 22.2 mg/dL (ref 0.0–40.0)

## 2013-10-19 ENCOUNTER — Encounter: Payer: Self-pay | Admitting: Family

## 2013-10-19 ENCOUNTER — Ambulatory Visit (INDEPENDENT_AMBULATORY_CARE_PROVIDER_SITE_OTHER): Payer: BC Managed Care – PPO | Admitting: Family

## 2013-10-19 VITALS — BP 120/84 | HR 70 | Ht 64.25 in | Wt 154.0 lb

## 2013-10-19 DIAGNOSIS — F411 Generalized anxiety disorder: Secondary | ICD-10-CM

## 2013-10-19 DIAGNOSIS — Z0000 Encounter for general adult medical examination without abnormal findings: Secondary | ICD-10-CM

## 2013-10-19 NOTE — Progress Notes (Signed)
Subjective:    Patient ID: Monique Miller, female    DOB: Feb 25, 1951, 62 y.o.   MRN: [id number]  HPI   62 year old caucasian female, nonsmoker presenting  for a routine physical examination . Reviewed all health maintenance protocols including mammography colonoscopy bone density and reviewed appropriate screening labs. Her immunization history was reviewed as well as her current medications and allergies refills of her chronic medications were given and the plan for yearly health maintenance was discussed all orders and referrals were made as appropriate.     Review of Systems  Constitutional: Negative.   HENT: Negative.   Eyes: Negative.   Respiratory: Negative.   Cardiovascular: Negative.   Gastrointestinal: Negative.   Endocrine: Negative.   Genitourinary: Negative.   Musculoskeletal: Negative.   Skin: Negative.   Allergic/Immunologic: Negative.   Neurological: Negative.   Hematological: Negative.   Psychiatric/Behavioral: Negative.    Past Medical History  Diagnosis Date  . Depression     see [facility], production and Colen Darling  . Allergy   . Hx of migraines   . Thyroid disease     hx of hypothyroid  . Grover's disease     History   Social History  . Marital Status: Married    Spouse Name: N/A    Number of Children: N/A  . Years of Education: N/A   Occupational History  . Not on file.   Social History Main Topics  . Smoking status: Never Smoker   . Smokeless tobacco: Not on file  . Alcohol Use: Yes     Comment: occassionally  . Drug Use: No  . Sexual Activity: Not on file   Other Topics Concern  . Not on file   Social History Narrative  . No narrative on file    Past Surgical History  Procedure Laterality Date  . Cesarean section      x2  . Appendectomy    . Breast surgery      augmentation    Family History  Problem Relation Age of Onset  . Cancer Mother     pancreatic  . Cancer Maternal Uncle     pancreatic  . Cancer Maternal Grandmother      pancreatic    No Known Allergies  Current Outpatient Prescriptions on File Prior to Visit  Medication Sig Dispense Refill  . ALPRAZolam (XANAX) 1 MG tablet Take 1 mg by mouth at bedtime as needed.       Marland Kitchen amphetamine-dextroamphetamine (ADDERALL) 15 MG tablet Take by mouth. 1 tablet 2-3 times daily      . CORDRAN 0.05 % lotion Apply topically 2 (two) times daily.       . CYMBALTA 60 MG capsule Take 60 mg by mouth daily.       Marland Kitchen estradiol (ESTRACE VAGINAL) 0.1 MG/GM vaginal cream Use at bedtime two time per week  42.5 g  0  . buPROPion (WELLBUTRIN XL) 300 MG 24 hr tablet Take 300 mg by mouth daily.       . ciprofloxacin (CIPRO) 500 MG tablet Take 1 tablet (500 mg total) by mouth 2 (two) times daily.  6 tablet  0  . glucosamine-chondroitin 500-400 MG tablet Take 1 tablet by mouth 2 (two) times daily.        . nitrofurantoin, macrocrystal-monohydrate, (MACROBID) 100 MG capsule Take 1 capsule (100 mg total) by mouth 2 (two) times daily.  14 capsule  0   No current facility-administered medications on file prior to visit.  BP 120/84  Pulse 70  Ht 5' 4.25" (1.632 m)  Wt 154 lb (69.854 kg)  BMI 26.23 kg/m2chart    Objective:   Physical Exam  Constitutional: She is oriented to person, place, and time. She appears well-developed and well-nourished.  HENT:  Head: Normocephalic and atraumatic.  Right Ear: External ear normal.  Left Ear: External ear normal.  Nose: Nose normal.  Mouth/Throat: Oropharynx is clear and moist.  Eyes: Conjunctivae and EOM are normal. Pupils are equal, round, and reactive to light.  Neck: Normal range of motion. Neck supple. No thyromegaly present.  Cardiovascular: Normal rate, regular rhythm and normal heart sounds.   Pulmonary/Chest: Effort normal and breath sounds normal.  Abdominal: Soft. Bowel sounds are normal.  Musculoskeletal: Normal range of motion.  Neurological: She is alert and oriented to person, place, and time. She has normal reflexes.    Skin: Skin is warm and dry.  Psychiatric: She has a normal mood and affect.          Assessment & Plan:  Assessment: 1. CPX 2. Anxiety  3. ADD  Plan: Continue current medication.  Encouraged patient  to exercise.  Pap smear recommended in one year. Monthly self breast exams.

## 2013-10-19 NOTE — Patient Instructions (Signed)
Exercise to Stay Healthy Exercise helps you become and stay healthy. EXERCISE IDEAS AND TIPS Choose exercises that:  You enjoy.  Fit into your day. You do not need to exercise really hard to be healthy. You can do exercises at a slow or medium level and stay healthy. You can:  Stretch before and after working out.  Try yoga, Pilates, or tai chi.  Lift weights.  Walk fast, swim, jog, run, climb stairs, bicycle, dance, or rollerskate.  Take aerobic classes. Exercises that burn about 150 calories:  Running 1  miles in 15 minutes.  Playing volleyball for 45 to 60 minutes.  Washing and waxing a car for 45 to 60 minutes.  Playing touch football for 45 minutes.  Walking 1  miles in 35 minutes.  Pushing a stroller 1  miles in 30 minutes.  Playing basketball for 30 minutes.  Raking leaves for 30 minutes.  Bicycling 5 miles in 30 minutes.  Walking 2 miles in 30 minutes.  Dancing for 30 minutes.  Shoveling snow for 15 minutes.  Swimming laps for 20 minutes.  Walking up stairs for 15 minutes.  Bicycling 4 miles in 15 minutes.  Gardening for 30 to 45 minutes.  Jumping rope for 15 minutes.  Washing windows or floors for 45 to 60 minutes. Document Released: 12/01/2010 Document Revised: 01/21/2012 Document Reviewed: 12/01/2010 [employer] Patient Information 2014 [employer].  

## 2014-05-13 ENCOUNTER — Ambulatory Visit (INDEPENDENT_AMBULATORY_CARE_PROVIDER_SITE_OTHER): Payer: BC Managed Care – PPO | Admitting: Physician Assistant

## 2014-05-13 ENCOUNTER — Encounter: Payer: Self-pay | Admitting: Physician Assistant

## 2014-05-13 VITALS — BP 120/70 | HR 78 | Temp 99.0°F | Resp 20 | Wt 153.0 lb

## 2014-05-13 DIAGNOSIS — J189 Pneumonia, unspecified organism: Secondary | ICD-10-CM

## 2014-05-13 MED ORDER — AZITHROMYCIN 250 MG PO TABS: ORAL_TABLET | ORAL | Status: DC

## 2014-05-13 NOTE — Patient Instructions (Addendum)
Azithromycin 2 pills the first day, then 1 pill daily for the next 4 days.  Rest and drink plenty of fluids.  Plain Over the Counter Mucinex (NOT Mucinex D) for thick secretions  Force NON dairy fluids, drinking plenty of water is best.    Over the Counter Flonase OR Nasacort AQ 1 spray in each nostril twice a day as needed. Use the "crossover" technique into opposite nostril spraying toward opposite ear @ 45 degree angle, not straight up into nostril.   Plain Over the Counter Allegra (NOT D )  160 daily , OR Loratidine 10 mg , OR Zyrtec 10 mg @ bedtime  as needed for itchy eyes & sneezing.  Saline Irrigation and Saline Sprays can also help reduce symptoms.  If emergency symptoms discussed during visit developed, seek medical attention immediately.  Followup in about 1 week with PCP to reassess, or for worsening or persistent symptoms despite treatment.     Pneumonia, Adult Pneumonia is an infection of the lungs.  CAUSES Pneumonia may be caused by bacteria or a virus. Usually, these infections are caused by breathing infectious particles into the lungs (respiratory tract). SYMPTOMS   Cough.  Fever.  Chest pain.  Increased rate of breathing.  Wheezing.  Mucus production. DIAGNOSIS  If you have the common symptoms of pneumonia, your caregiver will typically confirm the diagnosis with a chest X-ray. The X-ray will show an abnormality in the lung (pulmonary infiltrate) if you have pneumonia. Other tests of your blood, urine, or sputum may be done to find the specific cause of your pneumonia. Your caregiver may also do tests (blood gases or pulse oximetry) to see how well your lungs are working. TREATMENT  Some forms of pneumonia may be spread to other people when you cough or sneeze. You may be asked to wear a mask before and during your exam. Pneumonia that is caused by bacteria is treated with antibiotic medicine. Pneumonia that is caused by the influenza virus may be treated  with an antiviral medicine. Most other viral infections must run their course. These infections will not respond to antibiotics.  PREVENTION A pneumococcal shot (vaccine) is available to prevent a common bacterial cause of pneumonia. This is usually suggested for:  People over 85 years old.  Patients on chemotherapy.  People with chronic lung problems, such as bronchitis or emphysema.  People with immune system problems. If you are over 65 or have a high risk condition, you may receive the pneumococcal vaccine if you have not received it before. In some countries, a routine influenza vaccine is also recommended. This vaccine can help prevent some cases of pneumonia.You may be offered the influenza vaccine as part of your care. If you smoke, it is time to quit. You may receive instructions on how to stop smoking. Your caregiver can provide medicines and counseling to help you quit. HOME CARE INSTRUCTIONS   Cough suppressants may be used if you are losing too much rest. However, coughing protects you by clearing your lungs. You should avoid using cough suppressants if you can.  Your caregiver may have prescribed medicine if he or she thinks your pneumonia is caused by a bacteria or influenza. Finish your medicine even if you start to feel better.  Your caregiver may also prescribe an expectorant. This loosens the mucus to be coughed up.  Only take over-the-counter or prescription medicines for pain, discomfort, or fever as directed by your caregiver.  Do not smoke. Smoking is a common cause of  bronchitis and can contribute to pneumonia. If you are a smoker and continue to smoke, your cough may last several weeks after your pneumonia has cleared.  A cold steam vaporizer or humidifier in your room or home may help loosen mucus.  Coughing is often worse at night. Sleeping in a semi-upright position in a recliner or using a couple pillows under your head will help with this.  Get rest as you  feel it is needed. Your body will usually let you know when you need to rest. SEEK IMMEDIATE MEDICAL CARE IF:   Your illness becomes worse. This is especially true if you are elderly or weakened from any other disease.  You cannot control your cough with suppressants and are losing sleep.  You begin coughing up blood.  You develop pain which is getting worse or is uncontrolled with medicines.  You have a fever.  Any of the symptoms which initially brought you in for treatment are getting worse rather than better.  You develop shortness of breath or chest pain. MAKE SURE YOU:   Understand these instructions.  Will watch your condition.  Will get help right away if you are not doing well or get worse. Document Released: 10/29/2005 Document Revised: 01/21/2012 Document Reviewed: 01/18/2011 [facility] Patient Information 2015 [place], [place]. This information is not intended to replace advice given to you by your health care provider. Make sure you discuss any questions you have with your health care provider.

## 2014-05-13 NOTE — Progress Notes (Signed)
Pre visit review using our clinic review tool, if applicable. No additional management support is needed unless otherwise documented below in the visit note. 

## 2014-05-13 NOTE — Progress Notes (Signed)
Subjective:    Patient ID: Monique Miller, female    DOB: [birth year], 63 y.o.   MRN: [id number]  Cough This is a new problem. The current episode started 1 to 4 weeks ago (about 2 weeks). The problem has been gradually worsening. The problem occurs every few minutes. The cough is productive of purulent sputum. Associated symptoms include chills, myalgias, nasal congestion, postnasal drip, a sore throat and sweats. Pertinent negatives include no chest pain, ear congestion, ear pain, fever, headaches, heartburn, hemoptysis, rash, rhinorrhea, shortness of breath, weight loss or wheezing. Nothing (worse at night.) aggravates the symptoms. Treatments tried: nyquil, dayquil, nasal decongestant. The treatment provided mild (temporary relief.) relief. Her past medical history is significant for environmental allergies. There is no history of asthma or COPD.      Review of Systems  Constitutional: Positive for chills. Negative for fever, weight loss, diaphoresis, fatigue and unexpected weight change.  HENT: Positive for postnasal drip, sinus pressure and sore throat. Negative for ear pain and rhinorrhea.   Respiratory: Positive for cough. Negative for hemoptysis, chest tightness, shortness of breath, wheezing and stridor.   Cardiovascular: Negative for chest pain, palpitations and leg swelling.  Gastrointestinal: Negative for heartburn, nausea, vomiting and diarrhea.  Musculoskeletal: Positive for myalgias.  Skin: Negative for rash.  Allergic/Immunologic: Positive for environmental allergies.  Neurological: Negative for headaches.  All other systems reviewed and are negative.     Past Medical History  Diagnosis Date  . Depression     see [facility] psychotherapist and Trey Paula  . Allergy   . Hx of migraines   . Thyroid disease     hx of hypothyroid  . Grover's disease     History   Social History  . Marital Status: Married    Spouse Name: N/A    Number of Children: N/A  . Years of Education:  N/A   Occupational History  . Not on file.   Social History Main Topics  . Smoking status: Never Smoker   . Smokeless tobacco: Not on file  . Alcohol Use: Yes     Comment: occassionally  . Drug Use: No  . Sexual Activity: Not on file   Other Topics Concern  . Not on file   Social History Narrative  . No narrative on file    Past Surgical History  Procedure Laterality Date  . Cesarean section      x2  . Appendectomy    . Breast surgery      augmentation    Family History  Problem Relation Age of Onset  . Cancer Mother     pancreatic  . Cancer Maternal Uncle     pancreatic  . Cancer Maternal Grandmother     pancreatic    No Known Allergies  Current Outpatient Prescriptions on File Prior to Visit  Medication Sig Dispense Refill  . ALPRAZolam (XANAX) 1 MG tablet Take 1 mg by mouth at bedtime as needed.       Marland Kitchen amphetamine-dextroamphetamine (ADDERALL) 15 MG tablet Take by mouth. 1 tablet 2-3 times daily      . buPROPion (WELLBUTRIN XL) 300 MG 24 hr tablet Take 300 mg by mouth daily.       Marland Kitchen CORDRAN 0.05 % lotion Apply topically 2 (two) times daily.       . CYMBALTA 60 MG capsule Take 60 mg by mouth daily.       Marland Kitchen estradiol (ESTRACE VAGINAL) 0.1 MG/GM vaginal cream Use at bedtime two time per  week  42.5 g  0  . glucosamine-chondroitin 500-400 MG tablet Take 1 tablet by mouth 2 (two) times daily.        . meloxicam (MOBIC) 15 MG tablet       . nitrofurantoin, macrocrystal-monohydrate, (MACROBID) 100 MG capsule Take 1 capsule (100 mg total) by mouth 2 (two) times daily.  14 capsule  0  . polyethylene glycol powder (GLYCOLAX/MIRALAX) powder        No current facility-administered medications on file prior to visit.    EXAM: BP 120/70  Pulse 78  Temp(Src) 99 F (37.2 C) (Oral)  Resp 20  Wt 153 lb (69.4 kg)  SpO2 98%      Objective:   Physical Exam  Nursing note and vitals reviewed. Constitutional: She is oriented to person, place, and time. She appears  well-developed and well-nourished. No distress.  HENT:  Head: Normocephalic and atraumatic.  Right Ear: External ear normal.  Left Ear: External ear normal.  Nose: Nose normal.  Mouth/Throat: No oropharyngeal exudate.  Oropharynx is slightly erythematous, no exudate. Bilateral TMs normal. Bilateral frontal and maxillary sinuses non-TTP.   Eyes: Conjunctivae and EOM are normal. Pupils are equal, round, and reactive to light.  Neck: Normal range of motion. Neck supple. No JVD present.  Cardiovascular: Normal rate, regular rhythm and intact distal pulses.   Pulmonary/Chest: Effort normal. No stridor. No respiratory distress (left-sided). She has no wheezes. She has rales. She exhibits no tenderness.  E. to A changes in left lung fields.  Musculoskeletal: Normal range of motion. She exhibits no edema.  Lymphadenopathy:    She has no cervical adenopathy.  Neurological: She is alert and oriented to person, place, and time.  Skin: Skin is warm and dry. No rash noted. She is not diaphoretic. No erythema. No pallor.  Psychiatric: She has a normal mood and affect. Her behavior is normal. Judgment and thought content normal.    Lab Results  Component Value Date   WBC 8.1 10/12/2013   HGB 14.4 10/12/2013   HCT 42.7 10/12/2013   PLT 246.0 10/12/2013   GLUCOSE 93 10/12/2013   CHOL 189 10/12/2013   TRIG 111.0 10/12/2013   HDL 54.90 10/12/2013   LDLDIRECT 115.5 11/09/2011   LDLCALC 112* 10/12/2013   ALT 18 10/12/2013   AST 20 10/12/2013   NA 137 10/12/2013   K 4.0 10/12/2013   CL 101 10/12/2013   CREATININE 1.2 10/12/2013   BUN 23 10/12/2013   CO2 27 10/12/2013   TSH 4.75 10/12/2013         Assessment & Plan:  Monique Miller was seen today for cough.  Diagnoses and associated orders for this visit:  CAP (community acquired pneumonia) Comments: 2 weeks of symptoms worsening cough, E to A changes Left Lung. Also some vague sinus symptoms. Will treat with Azithromycin.  - azithromycin (ZITHROMAX) 250 MG  tablet; 2 tabs the first day, followed by 1 tab daily for the next 4 days.   Patient will continue to monitor symptoms for worsening or improvement, if no improvement at followup, will obtain chest x-ray.  Return precautions provided, and patient handout on pneumonia.  Plan to follow up in one week to reassess, or for worsening or persistent symptoms despite treatment.  Patient Instructions  Azithromycin 2 pills the first day, then 1 pill daily for the next 4 days.  Rest and drink plenty of fluids.  Plain Over the Counter Mucinex (NOT Mucinex D) for thick secretions  Force NON dairy fluids, drinking  plenty of water is best.    Over the Counter Flonase OR Nasacort AQ 1 spray in each nostril twice a day as needed. Use the "crossover" technique into opposite nostril spraying toward opposite ear @ 45 degree angle, not straight up into nostril.   Plain Over the Counter Allegra (NOT D )  160 daily , OR Loratidine 10 mg , OR Zyrtec 10 mg @ bedtime  as needed for itchy eyes & sneezing.  Saline Irrigation and Saline Sprays can also help reduce symptoms.  If emergency symptoms discussed during visit developed, seek medical attention immediately.  Followup in about 1 week with PCP to reassess, or for worsening or persistent symptoms despite treatment.

## 2014-05-20 ENCOUNTER — Encounter: Payer: Self-pay | Admitting: Physician Assistant

## 2014-05-20 ENCOUNTER — Ambulatory Visit (INDEPENDENT_AMBULATORY_CARE_PROVIDER_SITE_OTHER): Payer: BC Managed Care – PPO | Admitting: Physician Assistant

## 2014-05-20 VITALS — BP 108/70 | HR 85 | Temp 98.3°F | Resp 18 | Wt 153.0 lb

## 2014-05-20 DIAGNOSIS — J189 Pneumonia, unspecified organism: Secondary | ICD-10-CM

## 2014-05-20 NOTE — Progress Notes (Signed)
Subjective:    Patient ID: Monique Miller, female    DOB: 1951/06/29, 63 y.o.   MRN: [id number]  HPI Patient is a 63 y.o. female presenting for follow up of pneumonia. Pt states that she feels better. Pt was seen 1 week ago and dx walking pneumonia. Was treated with azithromycin. Pt states she tolerated the medications well, no adverse effects. Pt states that last night she did not cough at all. She does admit to still experiencing some post nasal drip. Pt denies F/C/N/V/D/SOB/CP/HA/Syncope.     Review of Systems As per HPI and otherwise negative.    Past Medical History  Diagnosis Date  . Depression     see [facility] psych[facility] and Trey Paula  . Allergy   . Hx of migraines   . Thyroid disease     hx of hypothyroid  . Grover's disease     History   Social History  . Marital Status: Married    Spouse Name: N/A    Number of Children: N/A  . Years of Education: N/A   Occupational History  . Not on file.   Social History Main Topics  . Smoking status: Never Smoker   . Smokeless tobacco: Not on file  . Alcohol Use: Yes     Comment: occassionally  . Drug Use: No  . Sexual Activity: Not on file   Other Topics Concern  . Not on file   Social History Narrative  . No narrative on file    Past Surgical History  Procedure Laterality Date  . Cesarean section      x2  . Appendectomy    . Breast surgery      augmentation    Family History  Problem Relation Age of Onset  . Cancer Mother     pancreatic  . Cancer Maternal Uncle     pancreatic  . Cancer Maternal Grandmother     pancreatic    No Known Allergies  Current Outpatient Prescriptions on File Prior to Visit  Medication Sig Dispense Refill  . ALPRAZolam (XANAX) 1 MG tablet Take 1 mg by mouth at bedtime as needed.       Marland Kitchen amphetamine-dextroamphetamine (ADDERALL) 15 MG tablet Take by mouth. 1 tablet 2-3 times daily      . buPROPion (WELLBUTRIN XL) 300 MG 24 hr tablet Take 300 mg by mouth daily.       Marland Kitchen  CORDRAN 0.05 % lotion Apply topically 2 (two) times daily.       . CYMBALTA 60 MG capsule Take 60 mg by mouth daily.       Marland Kitchen estradiol (ESTRACE VAGINAL) 0.1 MG/GM vaginal cream Use at bedtime two time per week  42.5 g  0  . glucosamine-chondroitin 500-400 MG tablet Take 1 tablet by mouth 2 (two) times daily.        . meloxicam (MOBIC) 15 MG tablet       . polyethylene glycol powder (GLYCOLAX/MIRALAX) powder        No current facility-administered medications on file prior to visit.    EXAM: BP 108/70  Pulse 85  Temp(Src) 98.3 F (36.8 C) (Oral)  Resp 18  Wt 153 lb (69.4 kg)  SpO2 95%     Objective:   Physical Exam  Nursing note and vitals reviewed. Constitutional: She is oriented to person, place, and time. She appears well-developed and well-nourished. No distress.  HENT:  Head: Normocephalic and atraumatic.  Right Ear: External ear normal.  Left Ear: External ear  normal.  Nose: Nose normal.  Mouth/Throat: No oropharyngeal exudate.  Oropharynx is slightly erythematous, no exudate. Bilateral frontal and maxillary sinuses non-TTP.   Eyes: Conjunctivae and EOM are normal. Pupils are equal, round, and reactive to light.  Neck: Normal range of motion.  Cardiovascular: Normal rate, regular rhythm and intact distal pulses.   Pulmonary/Chest: Effort normal and breath sounds normal. No respiratory distress. She has no wheezes. She has no rales. She exhibits no tenderness.  Musculoskeletal: Normal range of motion. She exhibits no edema.  Neurological: She is alert and oriented to person, place, and time.  Skin: Skin is warm and dry. No rash noted. She is not diaphoretic. No erythema. No pallor.  Psychiatric: She has a normal mood and affect. Her behavior is normal. Judgment and thought content normal.    Lab Results  Component Value Date   WBC 8.1 10/12/2013   HGB 14.4 10/12/2013   HCT 42.7 10/12/2013   PLT 246.0 10/12/2013   GLUCOSE 93 10/12/2013   CHOL 189 10/12/2013   TRIG  111.0 10/12/2013   HDL 54.90 10/12/2013   LDLDIRECT 115.5 11/09/2011   LDLCALC 112* 10/12/2013   ALT 18 10/12/2013   AST 20 10/12/2013   NA 137 10/12/2013   K 4.0 10/12/2013   CL 101 10/12/2013   CREATININE 1.2 10/12/2013   BUN 23 10/12/2013   CO2 27 10/12/2013   TSH 4.75 10/12/2013         Assessment & Plan:  Bessy was seen today for follow-up.  Diagnoses and associated orders for this visit:  CAP (community acquired pneumonia) Comments: Resolved. Pt still ahving some post nasal drainage. Will use OTC steroid spray, saline spray, push fluids, mucinex.    Pt advised to use OTC nasal steroid, saline sprays, push fluids, and mucinex in order to clear throat symptoms.  Return precautions provided, and patient handout on cough.  Plan to follow up as needed, or for worsening or persistent symptoms despite treatment.  Patient Instructions  Plain Over the Counter Mucinex (NOT Mucinex D) for thick secretions  Force NON dairy fluids, drinking plenty of water is best.    Over the Counter Flonase OR Nasacort AQ 1 spray in each nostril twice a day as needed. Use the "crossover" technique into opposite nostril spraying toward opposite ear @ 45 degree angle, not straight up into nostril.   Plain Over the Counter Allegra (NOT D )  160 daily , OR Loratidine 10 mg , OR Zyrtec 10 mg @ bedtime  as needed for itchy eyes & sneezing.  Saline Irrigation and Saline Sprays can also help reduce symptoms.  If emergency symptoms discussed during visit developed, seek medical attention immediately.  Followup as needed, or for worsening or persistent symptoms despite treatment.

## 2014-05-20 NOTE — Patient Instructions (Signed)
Plain Over the Counter Mucinex (NOT Mucinex D) for thick secretions  Force NON dairy fluids, drinking plenty of water is best.    Over the Counter Flonase OR Nasacort AQ 1 spray in each nostril twice a day as needed. Use the "crossover" technique into opposite nostril spraying toward opposite ear @ 45 degree angle, not straight up into nostril.   Plain Over the Counter Allegra (NOT D )  160 daily , OR Loratidine 10 mg , OR Zyrtec 10 mg @ bedtime  as needed for itchy eyes & sneezing.  Saline Irrigation and Saline Sprays can also help reduce symptoms.  If emergency symptoms discussed during visit developed, seek medical attention immediately.  Followup as needed, or for worsening or persistent symptoms despite treatment.   Cough, Adult  A cough is a reflex. It helps you clear your throat and airways. A cough can help heal your body. A cough can last 2 or 3 weeks (acute) or may last more than 8 weeks (chronic). Some common causes of a cough can include an infection, allergy, or a cold. HOME CARE  Only take medicine as told by your doctor.  If given, take your medicines (antibiotics) as told. Finish them even if you start to feel better.  Use a cold steam vaporizer or humidier in your home. This can help loosen thick spit (secretions).  Sleep so you are almost sitting up (semi-upright). Use pillows to do this. This helps reduce coughing.  Rest as needed.  Stop smoking if you smoke. GET HELP RIGHT AWAY IF:  You have yellowish-white fluid (pus) in your thick spit.  Your cough gets worse.  Your medicine does not reduce coughing, and you are losing sleep.  You cough up blood.  You have trouble breathing.  Your pain gets worse and medicine does not help.  You have a fever. MAKE SURE YOU:   Understand these instructions.  Will watch your condition.  Will get help right away if you are not doing well or get worse. Document Released: 07/12/2011 Document Revised: 01/21/2012  Document Reviewed: 07/12/2011 [facility] Patient Information 2015 [place], [place]. This information is not intended to replace advice given to you by your health care provider. Make sure you discuss any questions you have with your health care provider.

## 2014-05-20 NOTE — Progress Notes (Signed)
Pre visit review using our clinic review tool, if applicable. No additional management support is needed unless otherwise documented below in the visit note. 

## 2014-06-28 ENCOUNTER — Other Ambulatory Visit: Payer: Self-pay | Admitting: Neurosurgery

## 2014-06-28 DIAGNOSIS — M4310 Spondylolisthesis, site unspecified: Secondary | ICD-10-CM

## 2014-07-02 ENCOUNTER — Ambulatory Visit
Admission: RE | Admit: 2014-07-02 | Discharge: 2014-07-02 | Disposition: A | Discharge status: Home / Self Care | Payer: BC Managed Care – PPO | Source: Ambulatory Visit | Attending: Neurosurgery | Admitting: Neurosurgery

## 2014-07-02 VITALS — BP 140/65 | HR 63

## 2014-07-02 DIAGNOSIS — M4310 Spondylolisthesis, site unspecified: Secondary | ICD-10-CM

## 2014-07-02 MED ORDER — IOHEXOL 180 MG/ML  SOLN
1.0000 mL | Freq: Once | INTRAMUSCULAR | Status: AC | PRN
  Administered 2014-07-02: 1 mL via EPIDURAL

## 2014-07-02 MED ORDER — METHYLPREDNISOLONE ACETATE 40 MG/ML INJ SUSP (RADIOLOG
120.0000 mg | Freq: Once | INTRAMUSCULAR | Status: AC
  Administered 2014-07-02: 120 mg via EPIDURAL

## 2014-07-02 MED ORDER — ONDANSETRON HCL 4 MG/2ML IJ SOLN
4.0000 mg | Freq: Once | INTRAMUSCULAR | Status: AC
  Administered 2014-07-02: 4 mg via INTRAMUSCULAR

## 2014-07-02 MED ORDER — MEPERIDINE HCL 100 MG/ML IJ SOLN
75.0000 mg | Freq: Once | INTRAMUSCULAR | Status: AC
  Administered 2014-07-02: 75 mg via INTRAMUSCULAR

## 2014-07-02 MED ORDER — DIAZEPAM 5 MG PO TABS
10.0000 mg | ORAL_TABLET | Freq: Once | ORAL | Status: AC
  Administered 2014-07-02: 10 mg via ORAL

## 2014-07-02 NOTE — Discharge Instructions (Signed)
Myelogram Discharge Instructions  1. Go home and rest quietly for the next 24 hours.  It is important to lie flat for the next 24 hours.  Get up only to go to the restroom.  You may lie in the bed or on a couch on your back, your stomach, your left side or your right side.  You may have one pillow under your head.  You may have pillows between your knees while you are on your side or under your knees while you are on your back.  2. DO NOT drive today.  Recline the seat as far back as it will go, while still wearing your seat belt, on the way home.  3. You may get up to go to the bathroom as needed.  You may sit up for 10 minutes to eat.  You may resume your normal diet and medications unless otherwise indicated.  Drink lots of extra fluids today and tomorrow.  4. The incidence of headache, nausea, or vomiting is about 5% (one in 20 patients).  If you develop a headache, lie flat and drink plenty of fluids until the headache goes away.  Caffeinated beverages may be helpful.  If you develop severe nausea and vomiting or a headache that does not go away with flat bed rest, call [phone].  5. You may resume normal activities after your 24 hours of bed rest is over; however, do not exert yourself strongly or do any heavy lifting tomorrow. If when you get up you have a headache when standing, go back to bed and force fluids for another 24 hours.  6. Call your physician for a follow-up appointment.  The results of your myelogram will be sent directly to your physician by the following day.  7. If you have any questions or if complications develop after you arrive home, please call [phone].  Discharge instructions have been explained to the patient.  The patient, or the person responsible for the patient, fully understands these instructions.      May resume Adderall and Cymbalta on Aug. 22, 2015, after 8:30 am.

## 2014-07-02 NOTE — Progress Notes (Signed)
Pt states she has been off the Adderall and Cymbalta for at least the last 2 days.  Discharge instructions explained to pt.

## 2014-07-16 ENCOUNTER — Other Ambulatory Visit: Payer: Self-pay | Admitting: Neurosurgery

## 2014-07-27 NOTE — Pre-Procedure Instructions (Signed)
Chontel Warning  07/27/2014   Your procedure is scheduled on:  Friday, September 25th  Report to [facility] at 0945 AM.  Call this number if you have problems the morning of surgery: [phone]   Remember:   Do not eat food or drink liquids after midnight.   Take these medicines the morning of surgery with A SIP OF WATER: cymbalta, wellbutrin   Do not wear jewelry, make-up or nail polish.  Do not wear lotions, powders, or perfumes. You may wear deodorant.  Do not shave 48 hours prior to surgery. Men may shave face and neck.  Do not bring valuables to the hospital.  [facility] is not responsible for any belongings or valuables.               Contacts, dentures or bridgework may not be worn into surgery.  Leave suitcase in the car. After surgery it may be brought to your room.  For patients admitted to the hospital, discharge time is determined by your treatment team.               Patients discharged the day of surgery will not be allowed to drive home.  Please read over the following fact sheets that you were given: Pain Booklet, Coughing and Deep Breathing, Blood Transfusion Information, MRSA Information and Surgical Site Infection Prevention [place] - Preparing for Surgery  Before surgery, you can play an important role.  Because skin is not sterile, your skin needs to be as free of germs as possible.  You can reduce the number of germs on you skin by washing with CHG (chlorahexidine gluconate) soap before surgery.  CHG is an antiseptic cleaner which kills germs and bonds with the skin to continue killing germs even after washing.  Please DO NOT use if you have an allergy to CHG or antibacterial soaps.  If your skin becomes reddened/irritated stop using the CHG and inform your nurse when you arrive at Short Stay.  Do not shave (including legs and underarms) for at least 48 hours prior to the first CHG shower.  You may shave your face.  Please follow these  instructions carefully:   1.  Shower with CHG Soap the night before surgery and the morning of Surgery.  2.  If you choose to wash your hair, wash your hair first as usual with your normal shampoo.  3.  After you shampoo, rinse your hair and body thoroughly to remove the shampoo.  4.  Use CHG as you would any other liquid soap.  You can apply CHG directly to the skin and wash gently with scrungie or a clean washcloth.  5.  Apply the CHG Soap to your body ONLY FROM THE NECK DOWN.  Do not use on open wounds or open sores.  Avoid contact with your eyes, ears, mouth and genitals (private parts).  Wash genitals (private parts) with your normal soap.  6.  Wash thoroughly, paying special attention to the area where your surgery will be performed.  7.  Thoroughly rinse your body with warm water from the neck down.  8.  DO NOT shower/wash with your normal soap after using and rinsing off the CHG Soap.  9.  Pat yourself dry with a clean towel.            10.  Wear clean pajamas.            11.  Place clean sheets on your bed the night of  your first shower and do not sleep with pets.  Day of Surgery  Do not apply any lotions/deoderants the morning of surgery.  Please wear clean clothes to the hospital/surgery center.

## 2014-07-28 ENCOUNTER — Inpatient Hospital Stay (HOSPITAL_COMMUNITY)
Admission: RE | Admit: 2014-07-28 | Discharge: 2014-07-28 | Disposition: A | Discharge status: Home / Self Care | Payer: BC Managed Care – PPO | Source: Ambulatory Visit

## 2014-07-28 ENCOUNTER — Other Ambulatory Visit (HOSPITAL_COMMUNITY): Payer: BC Managed Care – PPO

## 2014-07-28 ENCOUNTER — Encounter (HOSPITAL_COMMUNITY): Payer: Self-pay | Admitting: Pharmacy Technician

## 2014-08-03 ENCOUNTER — Encounter (HOSPITAL_COMMUNITY)
Admission: RE | Admit: 2014-08-03 | Discharge: 2014-08-03 | Disposition: A | Discharge status: Home / Self Care | Payer: BC Managed Care – PPO | Source: Ambulatory Visit | Attending: Neurosurgery | Admitting: Neurosurgery

## 2014-08-03 ENCOUNTER — Encounter (HOSPITAL_COMMUNITY): Payer: Self-pay

## 2014-08-03 ENCOUNTER — Other Ambulatory Visit (HOSPITAL_COMMUNITY): Payer: Self-pay | Admitting: *Deleted

## 2014-08-03 DIAGNOSIS — M48061 Spinal stenosis, lumbar region without neurogenic claudication: Secondary | ICD-10-CM

## 2014-08-03 DIAGNOSIS — Q762 Congenital spondylolisthesis: Secondary | ICD-10-CM | POA: Insufficient documentation

## 2014-08-03 DIAGNOSIS — Z01812 Encounter for preprocedural laboratory examination: Secondary | ICD-10-CM

## 2014-08-03 HISTORY — DX: Slow transit constipation: K59.01

## 2014-08-03 HISTORY — DX: Anxiety disorder, unspecified: F41.9

## 2014-08-03 HISTORY — DX: Anemia, unspecified: D64.9

## 2014-08-03 HISTORY — DX: Malignant (primary) neoplasm, unspecified: C80.1

## 2014-08-03 HISTORY — DX: Unspecified osteoarthritis, unspecified site: M19.90

## 2014-08-03 HISTORY — DX: Restless legs syndrome: G25.81

## 2014-08-03 HISTORY — DX: Pneumonia, unspecified organism: J18.9

## 2014-08-03 LAB — BASIC METABOLIC PANEL
Anion gap: 12 (ref 5–15)
BUN: 17 mg/dL (ref 6–23)
CO2: 25 mEq/L (ref 19–32)
Calcium: 9.2 mg/dL (ref 8.4–10.5)
Chloride: 97 mEq/L (ref 96–112)
Creatinine, Ser: 0.88 mg/dL (ref 0.50–1.10)
GFR calc Af Amer: 81 mL/min — ABNORMAL LOW (ref >90)
GFR calc non Af Amer: 70 mL/min — ABNORMAL LOW (ref >90)
GLUCOSE: 90 mg/dL (ref 70–99)
POTASSIUM: 4.2 meq/L (ref 3.7–5.3)
Sodium: 134 mEq/L — ABNORMAL LOW (ref 137–147)

## 2014-08-03 LAB — SURGICAL PCR SCREEN
MRSA, PCR: NEGATIVE
Staphylococcus aureus: POSITIVE — AB

## 2014-08-03 LAB — CBC
HCT: 40.7 % (ref 36.0–46.0)
HEMOGLOBIN: 13.8 g/dL (ref 12.0–15.0)
MCH: 29.1 pg (ref 26.0–34.0)
MCHC: 33.9 g/dL (ref 30.0–36.0)
MCV: 85.9 fL (ref 78.0–100.0)
PLATELETS: 272 10*3/uL (ref 150–400)
RBC: 4.74 MIL/uL (ref 3.87–5.11)
RDW: 12.6 % (ref 11.5–15.5)
WBC: 6.2 10*3/uL (ref 4.0–10.5)

## 2014-08-03 LAB — TYPE AND SCREEN
ABO/RH(D): A POS
Antibody Screen: NEGATIVE

## 2014-08-03 LAB — ABO/RH: ABO/RH(D): A POS

## 2014-08-03 NOTE — Pre-Procedure Instructions (Signed)
Kaylen Motl  08/03/2014   Your procedure is scheduled on:  Friday, August 06, 2014 at 11:50 AM.   Report to [facility] Entrance "A" Admitting Office at 9:50 AM.   Call this number if you have problems the morning of surgery: [phone]   Remember:   Do not eat food or drink liquids after midnight Thursday, 08/05/14.   Take these medicines the morning of surgery with A SIP OF WATER: CYMBALTA, fexofenadine (ALLEGRA)    Do not wear jewelry, make-up or nail polish.  Do not wear lotions, powders, or perfumes. You may wear deodorant.  Do not shave 48 hours prior to surgery.   Do not bring valuables to the hospital.  [facility] is not responsible                  for any belongings or valuables.               Contacts, dentures or bridgework may not be worn into surgery.  Leave suitcase in the car. After surgery it may be brought to your room.  For patients admitted to the hospital, discharge time is determined by your                treatment team.              Special Instructions: [facility] - Preparing for Surgery  Before surgery, you can play an important role.  Because skin is not sterile, your skin needs to be as free of germs as possible.  You can reduce the number of germs on you skin by washing with CHG (chlorahexidine gluconate) soap before surgery.  CHG is an antiseptic cleaner which kills germs and bonds with the skin to continue killing germs even after washing.  Please DO NOT use if you have an allergy to CHG or antibacterial soaps.  If your skin becomes reddened/irritated stop using the CHG and inform your nurse when you arrive at Short Stay.  Do not shave (including legs and underarms) for at least 48 hours prior to the first CHG shower.  You may shave your face.  Please follow these instructions carefully:   1.  Shower with CHG Soap the night before surgery and the                                morning of Surgery.  2.  If you choose to wash your hair,  wash your hair first as usual with your       normal shampoo.  3.  After you shampoo, rinse your hair and body thoroughly to remove the                      Shampoo.  4.  Use CHG as you would any other liquid soap.  You can apply chg directly       to the skin and wash gently with scrungie or a clean washcloth.  5.  Apply the CHG Soap to your body ONLY FROM THE NECK DOWN.        Do not use on open wounds or open sores.  Avoid contact with your eyes, ears, mouth and genitals (private parts).  Wash genitals (private parts) with your normal soap.  6.  Wash thoroughly, paying special attention to the area where your surgery        will be performed.  7.  Thoroughly  rinse your body with warm water from the neck down.  8.  DO NOT shower/wash with your normal soap after using and rinsing off       the CHG Soap.  9.  Pat yourself dry with a clean towel.            10.  Wear clean pajamas.            11.  Place clean sheets on your bed the night of your first shower and do not        sleep with pets.  Day of Surgery  Do not apply any lotions the morning of surgery.  Please wear clean clothes to the hospital/surgery center.     Please read over the following fact sheets that you were given: Pain Booklet, Coughing and Deep Breathing, Blood Transfusion Information, MRSA Information and Surgical Site Infection Prevention

## 2014-08-03 NOTE — Progress Notes (Signed)
Mupirocin ointment Rx called into [facility] for positive PCR of staph. Called and spoke with pt's husband and notified him of result. He states he will give message to pt.

## 2014-08-05 MED ORDER — CEFAZOLIN SODIUM-DEXTROSE 2-3 GM-% IV SOLR
2.0000 g | INTRAVENOUS | Status: AC
  Administered 2014-08-06 (×2): 2 g via INTRAVENOUS
  Filled 2014-08-05: qty 50

## 2014-08-05 MED ORDER — DEXAMETHASONE SODIUM PHOSPHATE 10 MG/ML IJ SOLN
10.0000 mg | INTRAMUSCULAR | Status: AC
  Administered 2014-08-06: 10 mg via INTRAVENOUS

## 2014-08-06 ENCOUNTER — Inpatient Hospital Stay (HOSPITAL_COMMUNITY): Payer: BC Managed Care – PPO | Admitting: Certified Registered"

## 2014-08-06 ENCOUNTER — Inpatient Hospital Stay (HOSPITAL_COMMUNITY): Payer: BC Managed Care – PPO

## 2014-08-06 ENCOUNTER — Encounter (HOSPITAL_COMMUNITY): Payer: Self-pay | Admitting: *Deleted

## 2014-08-06 ENCOUNTER — Encounter (HOSPITAL_COMMUNITY): Payer: BC Managed Care – PPO | Admitting: Certified Registered"

## 2014-08-06 ENCOUNTER — Inpatient Hospital Stay (HOSPITAL_COMMUNITY)
Admission: RE | Admit: 2014-08-06 | Discharge: 2014-08-11 | DRG: 460 | Disposition: A | Discharge status: Home / Self Care | Payer: BC Managed Care – PPO | Source: Ambulatory Visit | Attending: Neurosurgery | Admitting: Neurosurgery

## 2014-08-06 ENCOUNTER — Encounter (HOSPITAL_COMMUNITY)
Admission: RE | Disposition: A | Discharge status: Home / Self Care | Payer: Self-pay | Source: Ambulatory Visit | Attending: Neurosurgery

## 2014-08-06 DIAGNOSIS — I959 Hypotension, unspecified: Secondary | ICD-10-CM | POA: Diagnosis not present

## 2014-08-06 DIAGNOSIS — R339 Retention of urine, unspecified: Secondary | ICD-10-CM | POA: Diagnosis not present

## 2014-08-06 DIAGNOSIS — G2581 Restless legs syndrome: Secondary | ICD-10-CM | POA: Diagnosis present

## 2014-08-06 DIAGNOSIS — M129 Arthropathy, unspecified: Secondary | ICD-10-CM | POA: Diagnosis present

## 2014-08-06 DIAGNOSIS — Z800 Family history of malignant neoplasm of digestive organs: Secondary | ICD-10-CM | POA: Diagnosis not present

## 2014-08-06 DIAGNOSIS — Z791 Long term (current) use of non-steroidal anti-inflammatories (NSAID): Secondary | ICD-10-CM | POA: Diagnosis not present

## 2014-08-06 DIAGNOSIS — G43909 Migraine, unspecified, not intractable, without status migrainosus: Secondary | ICD-10-CM | POA: Diagnosis present

## 2014-08-06 DIAGNOSIS — F3289 Other specified depressive episodes: Secondary | ICD-10-CM | POA: Diagnosis present

## 2014-08-06 DIAGNOSIS — Q762 Congenital spondylolisthesis: Secondary | ICD-10-CM | POA: Diagnosis not present

## 2014-08-06 DIAGNOSIS — M79609 Pain in unspecified limb: Secondary | ICD-10-CM | POA: Diagnosis present

## 2014-08-06 DIAGNOSIS — M48061 Spinal stenosis, lumbar region without neurogenic claudication: Secondary | ICD-10-CM | POA: Diagnosis present

## 2014-08-06 DIAGNOSIS — F329 Major depressive disorder, single episode, unspecified: Secondary | ICD-10-CM | POA: Diagnosis present

## 2014-08-06 SURGERY — POSTERIOR LUMBAR FUSION 2 LEVEL
Anesthesia: General | Site: Back

## 2014-08-06 MED ORDER — MIDAZOLAM HCL 2 MG/2ML IJ SOLN
INTRAMUSCULAR | Status: AC
  Filled 2014-08-06: qty 2

## 2014-08-06 MED ORDER — ONDANSETRON HCL 4 MG/2ML IJ SOLN
INTRAMUSCULAR | Status: DC | PRN
  Administered 2014-08-06: 4 mg via INTRAVENOUS

## 2014-08-06 MED ORDER — LACTATED RINGERS IV SOLN
Freq: Once | INTRAVENOUS | Status: AC
  Administered 2014-08-06: 11:00:00 via INTRAVENOUS

## 2014-08-06 MED ORDER — PROPOFOL 10 MG/ML IV BOLUS
INTRAVENOUS | Status: DC | PRN
  Administered 2014-08-06: 180 mg via INTRAVENOUS

## 2014-08-06 MED ORDER — FENTANYL CITRATE 0.05 MG/ML IJ SOLN
INTRAMUSCULAR | Status: DC | PRN
  Administered 2014-08-06: 50 ug via INTRAVENOUS
  Administered 2014-08-06: 100 ug via INTRAVENOUS
  Administered 2014-08-06 (×2): 50 ug via INTRAVENOUS
  Administered 2014-08-06: 25 ug via INTRAVENOUS
  Administered 2014-08-06 (×4): 50 ug via INTRAVENOUS
  Administered 2014-08-06: 25 ug via INTRAVENOUS

## 2014-08-06 MED ORDER — FENTANYL CITRATE 0.05 MG/ML IJ SOLN
INTRAMUSCULAR | Status: AC
Start: 2014-08-06 — End: 2014-08-06
  Filled 2014-08-06: qty 5

## 2014-08-06 MED ORDER — 0.9 % SODIUM CHLORIDE (POUR BTL) OPTIME
TOPICAL | Status: DC | PRN
  Administered 2014-08-06: 1000 mL

## 2014-08-06 MED ORDER — DEXAMETHASONE SODIUM PHOSPHATE 4 MG/ML IJ SOLN
INTRAMUSCULAR | Status: AC
  Filled 2014-08-06: qty 3

## 2014-08-06 MED ORDER — MENTHOL 3 MG MT LOZG
1.0000 | LOZENGE | OROMUCOSAL | Status: DC | PRN
  Filled 2014-08-06: qty 9

## 2014-08-06 MED ORDER — ROCURONIUM BROMIDE 100 MG/10ML IV SOLN
INTRAVENOUS | Status: DC | PRN
  Administered 2014-08-06: 50 mg via INTRAVENOUS

## 2014-08-06 MED ORDER — AMPHETAMINE-DEXTROAMPHETAMINE 15 MG PO TABS: 15.0000 mg | ORAL_TABLET | Freq: Two times a day (BID) | ORAL | Status: DC

## 2014-08-06 MED ORDER — ACETAMINOPHEN 325 MG PO TABS
650.0000 mg | ORAL_TABLET | ORAL | Status: DC | PRN
  Administered 2014-08-08: 650 mg via ORAL
  Filled 2014-08-06: qty 2

## 2014-08-06 MED ORDER — LACTATED RINGERS IV SOLN
INTRAVENOUS | Status: DC | PRN
  Administered 2014-08-06 (×4): via INTRAVENOUS

## 2014-08-06 MED ORDER — ARTIFICIAL TEARS OP OINT
TOPICAL_OINTMENT | OPHTHALMIC | Status: DC | PRN
  Administered 2014-08-06: 1 via OPHTHALMIC

## 2014-08-06 MED ORDER — DIPHENHYDRAMINE HCL 50 MG/ML IJ SOLN
INTRAMUSCULAR | Status: DC | PRN
  Administered 2014-08-06: 10 mg via INTRAVENOUS

## 2014-08-06 MED ORDER — LIDOCAINE HCL (CARDIAC) 20 MG/ML IV SOLN
INTRAVENOUS | Status: AC
  Filled 2014-08-06: qty 5

## 2014-08-06 MED ORDER — OXYCODONE HCL 5 MG/5ML PO SOLN: 5.0000 mg | Freq: Once | ORAL | Status: DC | PRN

## 2014-08-06 MED ORDER — ARTIFICIAL TEARS OP OINT
TOPICAL_OINTMENT | OPHTHALMIC | Status: AC
  Filled 2014-08-06: qty 3.5

## 2014-08-06 MED ORDER — DULOXETINE HCL 60 MG PO CPEP
120.0000 mg | ORAL_CAPSULE | Freq: Every day | ORAL | Status: DC
  Administered 2014-08-07 – 2014-08-11 (×5): 120 mg via ORAL
  Filled 2014-08-06 (×5): qty 2

## 2014-08-06 MED ORDER — CYCLOBENZAPRINE HCL 10 MG PO TABS
10.0000 mg | ORAL_TABLET | Freq: Three times a day (TID) | ORAL | Status: DC | PRN
  Administered 2014-08-07 – 2014-08-10 (×7): 10 mg via ORAL
  Filled 2014-08-06 (×7): qty 1

## 2014-08-06 MED ORDER — OXYCODONE-ACETAMINOPHEN 5-325 MG PO TABS
1.0000 | ORAL_TABLET | ORAL | Status: DC | PRN
  Administered 2014-08-06 – 2014-08-11 (×21): 2 via ORAL
  Filled 2014-08-06 (×21): qty 2

## 2014-08-06 MED ORDER — GLYCOPYRROLATE 0.2 MG/ML IJ SOLN
INTRAMUSCULAR | Status: AC
  Filled 2014-08-06: qty 3

## 2014-08-06 MED ORDER — SCOPOLAMINE 1 MG/3DAYS TD PT72
MEDICATED_PATCH | TRANSDERMAL | Status: DC | PRN
  Administered 2014-08-06: 1 via TRANSDERMAL

## 2014-08-06 MED ORDER — NEOSTIGMINE METHYLSULFATE 10 MG/10ML IV SOLN
INTRAVENOUS | Status: DC | PRN
  Administered 2014-08-06: 4 mg via INTRAVENOUS

## 2014-08-06 MED ORDER — PHENOL 1.4 % MT LIQD: 1.0000 | OROMUCOSAL | Status: DC | PRN

## 2014-08-06 MED ORDER — ALUM & MAG HYDROXIDE-SIMETH 200-200-20 MG/5ML PO SUSP: 30.0000 mL | Freq: Four times a day (QID) | ORAL | Status: DC | PRN

## 2014-08-06 MED ORDER — PROMETHAZINE HCL 25 MG/ML IJ SOLN: 6.2500 mg | INTRAMUSCULAR | Status: DC | PRN

## 2014-08-06 MED ORDER — ONDANSETRON HCL 4 MG/2ML IJ SOLN
INTRAMUSCULAR | Status: AC
  Filled 2014-08-06: qty 2

## 2014-08-06 MED ORDER — ONDANSETRON HCL 4 MG/2ML IJ SOLN
4.0000 mg | INTRAMUSCULAR | Status: DC | PRN
  Administered 2014-08-07 – 2014-08-08 (×6): 4 mg via INTRAVENOUS
  Filled 2014-08-06 (×6): qty 2

## 2014-08-06 MED ORDER — HYDROMORPHONE HCL 1 MG/ML IJ SOLN
INTRAMUSCULAR | Status: AC
  Filled 2014-08-06: qty 1

## 2014-08-06 MED ORDER — MORPHINE SULFATE 2 MG/ML IJ SOLN
1.0000 mg | INTRAMUSCULAR | Status: DC | PRN
  Administered 2014-08-06 – 2014-08-08 (×8): 2 mg via INTRAVENOUS
  Filled 2014-08-06 (×8): qty 1

## 2014-08-06 MED ORDER — HYDROMORPHONE HCL 1 MG/ML IJ SOLN
0.2500 mg | INTRAMUSCULAR | Status: DC | PRN
  Administered 2014-08-06 (×2): 0.5 mg via INTRAVENOUS

## 2014-08-06 MED ORDER — ALPRAZOLAM 0.5 MG PO TABS
1.0000 mg | ORAL_TABLET | Freq: Every day | ORAL | Status: DC
  Administered 2014-08-06 – 2014-08-10 (×5): 1 mg via ORAL
  Filled 2014-08-06 (×5): qty 2

## 2014-08-06 MED ORDER — OXYCODONE HCL 5 MG PO TABS: 5.0000 mg | ORAL_TABLET | Freq: Once | ORAL | Status: DC | PRN

## 2014-08-06 MED ORDER — FENTANYL CITRATE 0.05 MG/ML IJ SOLN
INTRAMUSCULAR | Status: AC
  Filled 2014-08-06: qty 5

## 2014-08-06 MED ORDER — NEOSTIGMINE METHYLSULFATE 10 MG/10ML IV SOLN
INTRAVENOUS | Status: AC
  Filled 2014-08-06: qty 1

## 2014-08-06 MED ORDER — AMPHETAMINE-DEXTROAMPHETAMINE 10 MG PO TABS
15.0000 mg | ORAL_TABLET | Freq: Two times a day (BID) | ORAL | Status: DC
  Administered 2014-08-09 – 2014-08-11 (×3): 15 mg via ORAL
  Filled 2014-08-06 (×5): qty 2

## 2014-08-06 MED ORDER — BUPIVACAINE HCL (PF) 0.5 % IJ SOLN
INTRAMUSCULAR | Status: DC | PRN
  Administered 2014-08-06: 10 mL
  Administered 2014-08-06: 20 mL

## 2014-08-06 MED ORDER — DOCUSATE SODIUM 100 MG PO CAPS
100.0000 mg | ORAL_CAPSULE | Freq: Two times a day (BID) | ORAL | Status: DC
  Administered 2014-08-06 – 2014-08-11 (×10): 100 mg via ORAL
  Filled 2014-08-06 (×10): qty 1

## 2014-08-06 MED ORDER — GLYCOPYRROLATE 0.2 MG/ML IJ SOLN
INTRAMUSCULAR | Status: DC | PRN
  Administered 2014-08-06: 0.6 mg via INTRAVENOUS

## 2014-08-06 MED ORDER — SODIUM CHLORIDE 0.9 % IV SOLN: 250.0000 mL | INTRAVENOUS | Status: DC

## 2014-08-06 MED ORDER — CEFAZOLIN SODIUM-DEXTROSE 2-3 GM-% IV SOLR
INTRAVENOUS | Status: AC
  Filled 2014-08-06: qty 50

## 2014-08-06 MED ORDER — MIDAZOLAM HCL 5 MG/5ML IJ SOLN
INTRAMUSCULAR | Status: DC | PRN
  Administered 2014-08-06: 2 mg via INTRAVENOUS

## 2014-08-06 MED ORDER — ACETAMINOPHEN 650 MG RE SUPP: 650.0000 mg | RECTAL | Status: DC | PRN

## 2014-08-06 MED ORDER — VECURONIUM BROMIDE 10 MG IV SOLR
INTRAVENOUS | Status: AC
  Filled 2014-08-06: qty 10

## 2014-08-06 MED ORDER — SCOPOLAMINE 1 MG/3DAYS TD PT72
MEDICATED_PATCH | TRANSDERMAL | Status: AC
  Filled 2014-08-06: qty 1

## 2014-08-06 MED ORDER — KCL IN DEXTROSE-NACL 20-5-0.45 MEQ/L-%-% IV SOLN
80.0000 mL/h | INTRAVENOUS | Status: DC
  Administered 2014-08-06 – 2014-08-08 (×2): 80 mL/h via INTRAVENOUS
  Filled 2014-08-06 (×4): qty 1000

## 2014-08-06 MED ORDER — PHENYLEPHRINE HCL 10 MG/ML IJ SOLN
INTRAMUSCULAR | Status: DC | PRN
  Administered 2014-08-06: 40 ug via INTRAVENOUS
  Administered 2014-08-06: 80 ug via INTRAVENOUS

## 2014-08-06 MED ORDER — PROPOFOL 10 MG/ML IV BOLUS
INTRAVENOUS | Status: AC
  Filled 2014-08-06: qty 20

## 2014-08-06 MED ORDER — BACITRACIN ZINC 500 UNIT/GM EX OINT
TOPICAL_OINTMENT | CUTANEOUS | Status: DC | PRN
  Administered 2014-08-06: 1 via TOPICAL

## 2014-08-06 MED ORDER — SODIUM CHLORIDE 0.9 % IJ SOLN: 3.0000 mL | INTRAMUSCULAR | Status: DC | PRN

## 2014-08-06 MED ORDER — LIDOCAINE HCL (CARDIAC) 20 MG/ML IV SOLN
INTRAVENOUS | Status: DC | PRN
  Administered 2014-08-06: 90 mg via INTRAVENOUS

## 2014-08-06 MED ORDER — THROMBIN 20000 UNITS EX SOLR
CUTANEOUS | Status: DC | PRN
  Administered 2014-08-06 (×2): via TOPICAL

## 2014-08-06 MED ORDER — DIPHENHYDRAMINE HCL 50 MG/ML IJ SOLN
INTRAMUSCULAR | Status: AC
  Filled 2014-08-06: qty 1

## 2014-08-06 MED ORDER — ALBUMIN HUMAN 5 % IV SOLN
INTRAVENOUS | Status: DC | PRN
  Administered 2014-08-06: 16:00:00 via INTRAVENOUS

## 2014-08-06 MED ORDER — CEFAZOLIN SODIUM-DEXTROSE 2-3 GM-% IV SOLR
2.0000 g | Freq: Three times a day (TID) | INTRAVENOUS | Status: AC
  Administered 2014-08-07 (×2): 2 g via INTRAVENOUS
  Filled 2014-08-06 (×2): qty 50

## 2014-08-06 MED ORDER — SODIUM CHLORIDE 0.9 % IR SOLN
Status: DC | PRN
  Administered 2014-08-06: 14:00:00

## 2014-08-06 MED ORDER — SODIUM CHLORIDE 0.9 % IJ SOLN
3.0000 mL | Freq: Two times a day (BID) | INTRAMUSCULAR | Status: DC
  Administered 2014-08-09 – 2014-08-10 (×2): 3 mL via INTRAVENOUS

## 2014-08-06 MED ORDER — ZOLPIDEM TARTRATE 5 MG PO TABS
5.0000 mg | ORAL_TABLET | Freq: Every evening | ORAL | Status: DC | PRN
  Administered 2014-08-09: 5 mg via ORAL
  Filled 2014-08-06: qty 1

## 2014-08-06 MED ORDER — VECURONIUM BROMIDE 10 MG IV SOLR
INTRAVENOUS | Status: DC | PRN
  Administered 2014-08-06: 1 mg via INTRAVENOUS
  Administered 2014-08-06 (×2): 2 mg via INTRAVENOUS
  Administered 2014-08-06: 1 mg via INTRAVENOUS
  Administered 2014-08-06: 3 mg via INTRAVENOUS
  Administered 2014-08-06: 1 mg via INTRAVENOUS

## 2014-08-06 MED ORDER — PANTOPRAZOLE SODIUM 40 MG IV SOLR
40.0000 mg | Freq: Every day | INTRAVENOUS | Status: DC
  Administered 2014-08-06: 40 mg via INTRAVENOUS
  Filled 2014-08-06: qty 40

## 2014-08-06 SURGICAL SUPPLY — 74 items
BAG DECANTER FOR FLEXI CONT (MISCELLANEOUS) ×2 IMPLANT
BENZOIN TINCTURE PRP APPL 2/3 (GAUZE/BANDAGES/DRESSINGS) ×4 IMPLANT
BLADE SURG ROTATE 9660 (MISCELLANEOUS) IMPLANT
BONE EQUIVA 10CC (Bone Implant) ×2 IMPLANT
BRUSH SCRUB EZ PLAIN DRY (MISCELLANEOUS) ×2 IMPLANT
BUR CUTTER 7.0 ROUND (BURR) ×4 IMPLANT
BUR MATCHSTICK NEURO 3.0 LAGG (BURR) ×2 IMPLANT
CAGE TLIF ARDIS 9X9X22 (Cage) ×4 IMPLANT
CANISTER SUCT 3000ML (MISCELLANEOUS) ×2 IMPLANT
CONT SPEC 4OZ CLIKSEAL STRL BL (MISCELLANEOUS) ×4 IMPLANT
COVER BACK TABLE 24X17X13 BIG (DRAPES) IMPLANT
COVER TABLE BACK 60X90 (DRAPES) ×2 IMPLANT
DERMABOND ADVANCED (GAUZE/BANDAGES/DRESSINGS)
DERMABOND ADVANCED .7 DNX12 (GAUZE/BANDAGES/DRESSINGS) IMPLANT
DRAPE C-ARM 42X72 X-RAY (DRAPES) ×4 IMPLANT
DRAPE C-ARMOR (DRAPES) ×2 IMPLANT
DRAPE LAPAROTOMY 100X72X124 (DRAPES) ×2 IMPLANT
DRAPE SURG 17X23 STRL (DRAPES) ×8 IMPLANT
DRSG OPSITE POSTOP 4X6 (GAUZE/BANDAGES/DRESSINGS) ×2 IMPLANT
DRSG TELFA 3X8 NADH (GAUZE/BANDAGES/DRESSINGS) ×2 IMPLANT
DURAPREP 26ML APPLICATOR (WOUND CARE) ×2 IMPLANT
ELECT REM PT RETURN 9FT ADLT (ELECTROSURGICAL) ×2
ELECTRODE REM PT RTRN 9FT ADLT (ELECTROSURGICAL) ×1 IMPLANT
EVACUATOR 1/8 PVC DRAIN (DRAIN) ×2 IMPLANT
GAUZE SPONGE 4X4 12PLY STRL (GAUZE/BANDAGES/DRESSINGS) ×2 IMPLANT
GAUZE SPONGE 4X4 16PLY XRAY LF (GAUZE/BANDAGES/DRESSINGS) IMPLANT
GLOVE BIO SURGEON STRL SZ7 (GLOVE) ×4 IMPLANT
GLOVE BIOGEL PI IND STRL 7.5 (GLOVE) ×1 IMPLANT
GLOVE BIOGEL PI IND STRL 8 (GLOVE) ×1 IMPLANT
GLOVE BIOGEL PI IND STRL 8.5 (GLOVE) ×1 IMPLANT
GLOVE BIOGEL PI INDICATOR 7.5 (GLOVE) ×1
GLOVE BIOGEL PI INDICATOR 8 (GLOVE) ×1
GLOVE BIOGEL PI INDICATOR 8.5 (GLOVE) ×1
GLOVE ECLIPSE 7.5 STRL STRAW (GLOVE) ×8 IMPLANT
GLOVE ECLIPSE 8.0 STRL XLNG CF (GLOVE) ×4 IMPLANT
GLOVE ECLIPSE 8.5 STRL (GLOVE) ×2 IMPLANT
GOWN STRL REUS W/ TWL LRG LVL3 (GOWN DISPOSABLE) ×1 IMPLANT
GOWN STRL REUS W/ TWL XL LVL3 (GOWN DISPOSABLE) ×2 IMPLANT
GOWN STRL REUS W/TWL 2XL LVL3 (GOWN DISPOSABLE) ×2 IMPLANT
GOWN STRL REUS W/TWL LRG LVL3 (GOWN DISPOSABLE) ×1
GOWN STRL REUS W/TWL XL LVL3 (GOWN DISPOSABLE) ×2
HANDLE PEDIGUARD CANNULATED (INSTRUMENTS) ×2 IMPLANT
IMPLANT ARDIS PEEK 109X22 (Orthopedic Implant) ×4 IMPLANT
K-WIRE NITHNOL TROCAR TIP (WIRE) ×12 IMPLANT
KIT BASIN OR (CUSTOM PROCEDURE TRAY) ×2 IMPLANT
KIT ROOM TURNOVER OR (KITS) ×2 IMPLANT
MILL MEDIUM DISP (BLADE) ×2 IMPLANT
NEEDLE 1 PEDIGUARD CANNULATED (NEEDLE) ×6 IMPLANT
NEEDLE HYPO 22GX1.5 SAFETY (NEEDLE) ×2 IMPLANT
NEEDLE TARGETING (NEEDLE) ×4 IMPLANT
NS IRRIG 1000ML POUR BTL (IV SOLUTION) ×2 IMPLANT
PACK LAMINECTOMY NEURO (CUSTOM PROCEDURE TRAY) ×2 IMPLANT
PAD ARMBOARD 7.5X6 YLW CONV (MISCELLANEOUS) ×6 IMPLANT
PATTIES SURGICAL .75X.75 (GAUZE/BANDAGES/DRESSINGS) IMPLANT
PEDICLE ACCESS TOOL SHEATH ×2 IMPLANT
ROD PRE BENT PERC 60MM (Rod) ×4 IMPLANT
SCREW PATHFINDER 5.5X35 (Screw) ×4 IMPLANT
SCREW PATHFINDER 5.5X40MM (Screw) ×8 IMPLANT
SHEATH PAT (SHEATH) ×2 IMPLANT
SPONGE LAP 4X18 X RAY DECT (DISPOSABLE) IMPLANT
SPONGE SURGIFOAM ABS GEL 100 (HEMOSTASIS) ×2 IMPLANT
STRIP CLOSURE SKIN 1/2X4 (GAUZE/BANDAGES/DRESSINGS) ×4 IMPLANT
SUT PROLENE 0 CT 1 30 (SUTURE) ×2 IMPLANT
SUT VIC AB 0 CT1 18XCR BRD8 (SUTURE) ×2 IMPLANT
SUT VIC AB 0 CT1 8-18 (SUTURE) ×2
SUT VIC AB 2-0 OS6 18 (SUTURE) ×8 IMPLANT
SUT VIC AB 3-0 CP2 18 (SUTURE) ×2 IMPLANT
SYR 20ML ECCENTRIC (SYRINGE) ×2 IMPLANT
TOP CLSR SEQUOIA (Orthopedic Implant) ×12 IMPLANT
TOWEL OR 17X24 6PK STRL BLUE (TOWEL DISPOSABLE) ×2 IMPLANT
TOWEL OR 17X26 10 PK STRL BLUE (TOWEL DISPOSABLE) ×2 IMPLANT
TRAP SPECIMEN MUCOUS 40CC (MISCELLANEOUS) ×2 IMPLANT
TRAY FOLEY CATH 14FRSI W/METER (CATHETERS) ×2 IMPLANT
WATER STERILE IRR 1000ML POUR (IV SOLUTION) ×2 IMPLANT

## 2014-08-06 NOTE — Op Note (Signed)
Preop diagnosis: Spinal stenosis L4-5 L5-S1 with central and lateral recess stenosis and spondylolisthesis L4-5 Postop diagnosis: Same Procedure: L4-5 L5-S1 bilateral decompressive laminectomy for resolution of central and lateral recess stenosis more than needed for interbody fusion L4-5 L5-S1 bilateral microdiscectomy L4-5 L5-S1 posterior lumbar interbody fusion with peek interbody spacer L4-5 L5-S1 posterolateral fusion L4-5 L5-S1 segmental pedicle screw instrumentation with Pathfinder percutaneous pedicle screw system Surgeon: Veterinary surgeon: [facility]  After being placed the prone position the patient's back was prepped and draped in the usual sterile fashion. Localizing fluoroscopy was used prior to incision to identify the appropriate level. Midline incision was made above the spinous processes of L4-L5 and S1. Using Bovie cutting current the incision was carried down to the dorsal lumbar fascia. The plane between the subcutaneous tissue and fascial was dissected free. We then did a subperiosteal dissection on the spinous processes of L4-L5 and S1. With subperiosteal on the spinous processes lamina facet joint bilaterally. Subcutaneous tract was placed for exposure and x-ray showed approach the appropriate levels. Spinous processes were removed. Starting the patient's left side generous laminotomy was performed removing the inferior one half of the L4 lamina the medial three quarters of the facet joint the superior one third of the L5 lamina. Residual bone and ligamentum flavum removed in a piecemeal fashion. We then removed the inferior one half the L5 lamina the medial facet joint at L5-S1 and the superior one third of the S1 segment. Once again residual bone and ligamentum flavum removed in a piecemeal fashion. We then did a similar decompression the opposite side and then removed the midline structures to complete the bilateral decompressive laminectomy. Thorough decompression was carried out of  the L4-L5 and S1 nerve roots more so than needed for interbody fusion we also removed a significant midline stenosis. At this time disc spaces range bilaterally and thoroughly cleaned out pituitary rongeurs and curettes. Thorough displaced cleanout was carried on the same time great care was taken to avoid injury to the neural elements and this was successfully done. At this time we prepared the disc space for interbody fusion. At L4-5 we distracted up to a 10 mm size and L5-S1 up to a 9 mm size. The felt to be good choice his. We completely prepared the disc space for by fusion and impacted 9 x 9 x 20 mm cages L5-S1 and 9 x 10 x 20 mm cages at L4-5. Prior to placing the second cage we placed autologous bone morselized allograft it within the interspace to help with interbody fusion. We then decorticated facet joint placed morselized allograft and autologous bone for posterior lateral fusion L4-5 L5-S1. We then slightly close the midline fascia and then placed percutaneous pedicle screws through the fascia bilaterally at L4-L5 and S1. We passed the Jamshidi needle with the ultrasound guiding and then placed guidewires. We tapped with a 5.0 mm tap and placed 5.5 x 40 mm screws at L4 and L5 bilaterally and 5.0 x 35 mm screws at S1. Fossae showed these to be in good position. We passed appropriately length rods down the [facility] and secured them to the top of the screws with top loading nuts with final tightening with torque and counter torque. We then removed the [facility] arthroscopy good position showed good position of the interbody devices screws and rods. Irrigation was carried out once more. We'll drain in the epidural space and and the at the fascia space. We then closed the fascia with 0 Vicryl irrigated once more and  then closed the subcutaneous and subcuticular tissues with interrupted Vicryl. We did a running locking Prolene on the skin. Sterile dressings were then applied the patient was extubated and taken to  recovery in stable condition.

## 2014-08-06 NOTE — H&P (Signed)
  Monique Miller is an 63 y.o. female.   Chief Complaint: Back and leg pain HPI: 63 yo female with back and leg pain. Tried and failed conservative therapy. MRI done and consulted to me. Myelo Ct done which showed listhesis and stenosis at L 45 and L5S1. Options discussed, and patient requested surgery. Now admitted for 2 level plif with pedicle screw fixation.Risks and benefits discussed. Questions answered. Requests we proceed.  Past Medical History  Diagnosis Date  . Depression     see [facility] psychotherapist and Trey Paula  . Allergy   . Hx of migraines   . Thyroid disease     hx of hypothyroid  . Grover's disease   . Anxiety   . Restless leg syndrome   . Pneumonia     walking pneumonia  . Arthritis   . Cancer     skin cancer (basal cell)  . Anemia     "many years ago"  . Constipation by delayed colonic transit     Past Surgical History  Procedure Laterality Date  . Cesarean section      x2  . Appendectomy    . Breast surgery      augmentation  . Colonoscopy      Family History  Problem Relation Age of Onset  . Cancer Mother     pancreatic  . Cancer Maternal Uncle     pancreatic  . Cancer Maternal Grandmother     pancreatic   Social History:  reports that she has never smoked. She has never used smokeless tobacco. She reports that she drinks about 4.2 ounces of alcohol per week. She reports that she does not use illicit drugs.  Allergies: No Known Allergies  Medications Prior to Admission  Medication Sig Dispense Refill  . ALPRAZolam (XANAX) 1 MG tablet Take 1 mg by mouth at bedtime.       Marland Kitchen amphetamine-dextroamphetamine (ADDERALL) 15 MG tablet Take 15 mg by mouth 2 (two) times daily. 1 tablet 2-3 times daily      . bisacodyl (DULCOLAX) 5 MG EC tablet Take 5 mg by mouth daily.      . CYMBALTA 60 MG capsule Take 120 mg by mouth daily.       . fexofenadine (ALLEGRA) 180 MG tablet Take 180 mg by mouth daily.      . meloxicam (MOBIC) 15 MG tablet Take 15 mg by mouth  daily.         No results found for this or any previous visit (from the past 48 hour(s)). No results found.  leg pain, anxiety, depression  Blood pressure 151/70, pulse 66, temperature 98.6 F (37 C), temperature source Oral, resp. rate 20, weight 69.4 kg (153 lb), SpO2 100.00%.  Decreased relexes, normal strength; mildly antalgic gait Assessment/Plan Stenosis and listhesis L45 L5S1; plan for 2 level plif  Faythe Ghee, MD 08/06/2014, 11:57 AM

## 2014-08-06 NOTE — Anesthesia Postprocedure Evaluation (Signed)
  Anesthesia Post-op Note  Patient: Monique Miller  Procedure(s) Performed: Procedure(s): Lumbar four-five, Lumbar five-Sacral one Posterior Lumbar Interbody Fusion with Pedicle Screws (N/A)  Patient Location: PACU  Anesthesia Type:General  Level of Consciousness: awake, alert  and oriented  Airway and Oxygen Therapy: Patient Spontanous Breathing and Patient connected to nasal cannula oxygen  Post-op Pain: mild  Post-op Assessment: Post-op Vital signs reviewed, Patient's Cardiovascular Status Stable, Respiratory Function Stable, Patent Airway, No signs of Nausea or vomiting and Pain level controlled  Post-op Vital Signs: stable  Last Vitals:  Filed Vitals:   08/06/14 1800  BP:   Pulse:   Temp: 37.3 C  Resp:     Complications: No apparent anesthesia complications

## 2014-08-06 NOTE — Transfer of Care (Signed)
Immediate Anesthesia Transfer of Care Note  Patient: Monique Miller  Procedure(s) Performed: Procedure(s): Lumbar four-five, Lumbar five-Sacral one Posterior Lumbar Interbody Fusion with Pedicle Screws (N/A)  Patient Location: PACU  Anesthesia Type:General  Level of Consciousness: awake, alert  and oriented  Airway & Oxygen Therapy: Patient Spontanous Breathing and Patient connected to nasal cannula oxygen  Post-op Assessment: Report given to PACU RN, Post -op Vital signs reviewed and stable and Patient moving all extremities X 4  Post vital signs: Reviewed and stable  Complications: No apparent anesthesia complications

## 2014-08-06 NOTE — Anesthesia Procedure Notes (Signed)
Procedure Name: Intubation Date/Time: 08/06/2014 12:31 PM Performed by: Gaylene Brooks Pre-anesthesia Checklist: Patient being monitored, Suction available, Emergency Drugs available, Patient identified and Timeout performed Patient Re-evaluated:Patient Re-evaluated prior to inductionOxygen Delivery Method: Circle system utilized Preoxygenation: Pre-oxygenation with 100% oxygen Intubation Type: IV induction Ventilation: Mask ventilation without difficulty Laryngoscope Size: Miller and 2 Grade View: Grade I Tube type: Oral Tube size: 7.0 mm Number of attempts: 1 Airway Equipment and Method: Stylet Placement Confirmation: ETT inserted through vocal cords under direct vision,  breath sounds checked- equal and bilateral,  positive ETCO2 and CO2 detector Secured at: 22 cm Tube secured with: Tape Dental Injury: Teeth and Oropharynx as per pre-operative assessment

## 2014-08-06 NOTE — Progress Notes (Signed)
Pt was staph positive on PCR screen.  Pt has had 6/10 doses.  Pt will need to complete a total of 10 doses.  Last dose was AM day of surgery, 08/06/2014.

## 2014-08-06 NOTE — Anesthesia Preprocedure Evaluation (Addendum)
Anesthesia Evaluation  Patient identified by MRN, date of birth, ID band Patient awake    Reviewed: Allergy & Precautions, H&P , NPO status , Patient's Chart, lab work & pertinent test results  History of Anesthesia Complications (+) PONVNegative for: history of anesthetic complications  Airway Mallampati: II TM Distance: >3 FB Neck ROM: Full    Dental  (+) Teeth Intact, Dental Advisory Given   Pulmonary neg pulmonary ROS,    Pulmonary exam normal       Cardiovascular negative cardio ROS      Neuro/Psych PSYCHIATRIC DISORDERS Anxiety Depression    GI/Hepatic negative GI ROS, Neg liver ROS,   Endo/Other  negative endocrine ROS  Renal/GU negative Renal ROS     Musculoskeletal  (+) Arthritis -,   Abdominal   Peds  Hematology   Anesthesia Other Findings   Reproductive/Obstetrics                          Anesthesia Physical Anesthesia Plan  ASA: II  Anesthesia Plan: General   Post-op Pain Management:    Induction: Intravenous  Airway Management Planned: Oral ETT  Additional Equipment:   Intra-op Plan:   Post-operative Plan: Extubation in OR  Informed Consent: I have reviewed the patients History and Physical, chart, labs and discussed the procedure including the risks, benefits and alternatives for the proposed anesthesia with the patient or authorized representative who has indicated his/her understanding and acceptance.   Dental advisory given  Plan Discussed with: CRNA, Anesthesiologist and Surgeon  Anesthesia Plan Comments:        Anesthesia Quick Evaluation

## 2014-08-07 MED ORDER — PANTOPRAZOLE SODIUM 40 MG PO TBEC
40.0000 mg | DELAYED_RELEASE_TABLET | Freq: Every day | ORAL | Status: DC
  Administered 2014-08-07 – 2014-08-10 (×4): 40 mg via ORAL
  Filled 2014-08-07 (×4): qty 1

## 2014-08-07 MED ORDER — SODIUM CHLORIDE 0.9 % IV BOLUS (SEPSIS)
500.0000 mL | Freq: Once | INTRAVENOUS | Status: AC
  Administered 2014-08-07: 500 mL via INTRAVENOUS

## 2014-08-07 MED ORDER — KETOROLAC TROMETHAMINE 30 MG/ML IJ SOLN
30.0000 mg | Freq: Once | INTRAMUSCULAR | Status: AC
  Administered 2014-08-07: 30 mg via INTRAVENOUS
  Filled 2014-08-07: qty 1

## 2014-08-07 NOTE — Progress Notes (Signed)
OT Cancellation Note  Patient Details Name: Monique Miller MRN: [id number] DOB: 10/16/1951   Cancelled Treatment:    Reason Eval/Treat Not Completed: Medical issues which prohibited therapy. Cancellation due to low BP. Will check back later.  Benito Mccreedy OTR/L [phone] 08/07/2014, 10:35 AM

## 2014-08-07 NOTE — Progress Notes (Signed)
Pt arrived on unit rm 4N06 approx 1915 hrs, A&Ox4, C/O severe back pain, neuro intact, oriented to room and equipment. Medicated and repositioned for pain control.

## 2014-08-07 NOTE — Evaluation (Signed)
Physical Therapy Evaluation Patient Details Name: Monique Miller MRN: [id number] DOB: [birth year] Today's Date: 08/07/2014   History of Present Illness  L4/5 L5/S1 PLIF  Clinical Impression  Patient did well with mobility, however, limited by pain.  Feel patient will progress steadily once pain controlled.  Patient will great support system in husband and feel she will be ready for d/c in 1-2 days.  Will benefit from PT to progress ambulation and increase independence for return home.      Follow Up Recommendations Outpatient PT (when cleared by MD)    Equipment Recommendations       Recommendations for Other Services       Precautions / Restrictions Precautions Precautions: Back Required Braces or Orthoses: Spinal Brace Spinal Brace: Lumbar corset;Applied in sitting position      Mobility  Bed Mobility Overal bed mobility: Needs Assistance Bed Mobility: Rolling;Sidelying to Sit;Sit to Sidelying Rolling: Min assist Sidelying to sit: Min assist     Sit to sidelying: Min assist General bed mobility comments: uses railing, cueing for hand placement, assist to raise shoulders off bed and lower to bed.  Transfers Overall transfer level: Needs assistance Equipment used: Rolling walker (2 wheeled) Transfers: Sit to/from Stand Sit to Stand: Min guard         General transfer comment: guarding for safety due to pain  Ambulation/Gait Ambulation/Gait assistance: Min guard Ambulation Distance (Feet): 20 Feet Assistive device: Rolling walker (2 wheeled) Gait Pattern/deviations: Step-through pattern;Decreased stride length Gait velocity: decreased      Stairs            Wheelchair Mobility    Modified Rankin (Stroke Patients Only)       Balance Overall balance assessment: No apparent balance deficits (not formally assessed)                                           Pertinent Vitals/Pain Pain Assessment: 0-10 Pain Score: 9  Pain Location:  back and hip Pain Descriptors / Indicators: Constant Pain Intervention(s): Limited activity within patient's tolerance;Monitored during session;Premedicated before session;Utilized relaxation techniques    Home Living Family/patient expects to be discharged to:: Private residence Living Arrangements: Spouse/significant other Available Help at Discharge: Family Type of Home: House Home Access: Stairs to enter Entrance Stairs-Rails: None Entrance Stairs-Number of Steps: 3 Home Layout: Two level;Able to live on main level with bedroom/bathroom Home Equipment: None      Prior Function Level of Independence: Independent               Hand Dominance        Extremity/Trunk Assessment   Upper Extremity Assessment: Generalized weakness           Lower Extremity Assessment: Generalized weakness      Cervical / Trunk Assessment: Normal  Communication   Communication: No difficulties  Cognition Arousal/Alertness: Awake/alert Behavior During Therapy: WFL for tasks assessed/performed Overall Cognitive Status: Within Functional Limits for tasks assessed                      General Comments      Exercises        Assessment/Plan    PT Assessment Patient needs continued PT services  PT Diagnosis Difficulty walking;Acute pain   PT Problem List Decreased activity tolerance;Decreased mobility;Decreased knowledge of use of DME;Decreased knowledge of precautions  PT Treatment Interventions DME instruction;Gait  training;Stair training;Functional mobility training;Therapeutic activities;Therapeutic exercise;Patient/family education   PT Goals (Current goals can be found in the Care Plan section) Acute Rehab PT Goals Patient Stated Goal: not hurt PT Goal Formulation: With patient Time For Goal Achievement: 08/14/14 Potential to Achieve Goals: Good    Frequency Min 6X/week   Barriers to discharge        Co-evaluation               End of Session  Equipment Utilized During Treatment: Back brace Activity Tolerance: Patient limited by pain Patient left: in bed;with call bell/phone within reach;with family/visitor present           Time: 1218-1253 PT Time Calculation (min): 35 min   Charges:   PT Evaluation $Initial PT Evaluation Tier I: 1 Procedure PT Treatments $Gait Training: 8-22 mins   PT G CodesShanna Cisco, [place] 08/07/2014, 1:00 PM

## 2014-08-07 NOTE — Progress Notes (Signed)
Patient unable to void post catheter removal, in and out cath, 42mls drained.

## 2014-08-07 NOTE — Progress Notes (Signed)
Patient wanted something for pain, she is due for morphine,but blood pressure is 95/42. MD paged to see if she can get thing else.Still awaiting reply.

## 2014-08-07 NOTE — Evaluation (Signed)
Occupational Therapy Evaluation Patient Details Name: Monique Miller MRN: [id number] DOB: 1951/07/25 Today's Date: 08/07/2014    History of Present Illness L4/5 L5/S1 PLIF   Clinical Impression   Pt s/p above. Pt independent with ADLs, PTA. Feel pt will benefit from acute OT to increase independence prior to d/c.     Follow Up Recommendations  No OT follow up;Supervision - Intermittent    Equipment Recommendations  3 in 1 bedside comode    Recommendations for Other Services       Precautions / Restrictions Precautions Precautions: Back Precaution Booklet Issued: Yes (comment) Precaution Comments: Educated on back precautions Required Braces or Orthoses: Spinal Brace Spinal Brace: Lumbar corset;Applied in sitting position Restrictions Weight Bearing Restrictions: No      Mobility Bed Mobility Overal bed mobility: Needs Assistance Bed Mobility: Rolling;Sidelying to Sit;Sit to Sidelying Rolling: Min guard Sidelying to sit: Min assist     Sit to sidelying: Min guard General bed mobility comments: cues for technique.  Transfers Overall transfer level: Needs assistance Equipment used: Rolling walker (2 wheeled) Transfers: Sit to/from Stand Sit to Stand: Min guard         General transfer comment: cues for hand placement.         ADL Overall ADL's : Needs assistance/impaired                 Upper Body Dressing : Moderate assistance;Sitting   Lower Body Dressing: Maximal assistance;Sit to/from stand   Toilet Transfer: Min guard;RW;Ambulation (3 in 1 over commode)   Toileting- Clothing Manipulation and Hygiene: Minimal assistance;Sit to/from stand       Functional mobility during ADLs: Min guard;Rolling walker General ADL Comments: Educated on use of AE for LB ADLs. Educated on use of cup for teeth care and placement of grooming items to avoid breaking precautions. Educated on safety tips for home (use of bag on walker, pets, safe shoewear, rugs,  sitting for LB bathing). Educated on back brace.     Vision                     Perception     Praxis      Pertinent Vitals/Pain Pain Assessment: 0-10 Pain Score: 9  Pain Location: back Pain Descriptors / Indicators: Constant Pain Intervention(s): Monitored during session;Repositioned     Hand Dominance Right   Extremity/Trunk Assessment Upper Extremity Assessment Upper Extremity Assessment: Overall WFL for tasks assessed   Lower Extremity Assessment Lower Extremity Assessment: Defer to PT evaluation   Cervical / Trunk Assessment Cervical / Trunk Assessment: Normal   Communication Communication Communication: No difficulties   Cognition Arousal/Alertness: Lethargic Behavior During Therapy: WFL for tasks assessed/performed Overall Cognitive Status: Within Functional Limits for tasks assessed                     General Comments       Exercises       Shoulder Instructions      Home Living Family/patient expects to be discharged to:: Private residence Living Arrangements: Spouse/significant other Available Help at Discharge: Family Type of Home: House Home Access: Stairs to enter Technical brewer of Steps: 3 Entrance Stairs-Rails: None Home Layout: Two level;Able to live on main level with bedroom/bathroom     Bathroom Shower/Tub: Occupational psychologist: Standard     Home Equipment:  (has type of chair for shower)          Prior Functioning/Environment Level of Independence: Independent  OT Diagnosis: Acute pain   OT Problem List: Decreased strength;Decreased range of motion;Decreased activity tolerance;Decreased knowledge of use of DME or AE;Decreased knowledge of precautions;Pain   OT Treatment/Interventions: Self-care/ADL training;DME and/or AE instruction;Therapeutic activities;Patient/family education;Balance training    OT Goals(Current goals can be found in the care plan section) Acute  Rehab OT Goals Patient Stated Goal: not stated OT Goal Formulation: With patient Time For Goal Achievement: 08/14/14 Potential to Achieve Goals: Good ADL Goals Pt Will Perform Grooming: with modified independence;standing Pt Will Transfer to Toilet: with modified independence;ambulating (3 in 1 over commode) Pt Will Perform Toileting - Clothing Manipulation and hygiene: with modified independence;sit to/from stand;with adaptive equipment Additional ADL Goal #1: Pt will independently verbalize and demonstrate 3/3 back precautions.  OT Frequency: Min 2X/week   Barriers to D/C:            Co-evaluation              End of Session Equipment Utilized During Treatment: Gait belt;Rolling walker;Back brace  Activity Tolerance: Patient limited by pain;Patient limited by lethargy Patient left: in bed;with call bell/phone within reach   Time: 5631-4970 OT Time Calculation (min): 29 min Charges:  OT General Charges $OT Visit: 1 Procedure OT Evaluation $Initial OT Evaluation Tier I: 1 Procedure OT Treatments $Self Care/Home Management : 8-22 mins G-CodesBenito Mccreedy OTR/L [phone] 08/07/2014, 4:19 PM

## 2014-08-07 NOTE — Progress Notes (Signed)
Subjective: Patient reports overall doing okay no significant change in the leg pain yet but no new numbness or tingling and no new pain legs that wasn't there preoperatively.  Objective: Vital signs in last 24 hours: Temp:  [97.7 F (36.5 C)-99.2 F (37.3 C)] 97.7 F (36.5 C) (09/26 0510) Pulse Rate:  [65-67] 66 (09/26 0510) Resp:  [14-20] 18 (09/26 0510) BP: (87-151)/(40-70) 87/40 mmHg (09/26 0510) SpO2:  [92 %-100 %] 92 % (09/26 0510) Weight:  [69.4 kg (153 lb)-75.116 kg (165 lb 9.6 oz)] 75.116 kg (165 lb 9.6 oz) (09/26 0518)  Intake/Output from previous day: 09/25 0701 - 09/26 0700 In: 3250 [I.V.:3000; IV Piggyback:250] Out: 1940 [UMPNT:6144; Drains:150; Blood:300] Intake/Output this shift: Total I/O In: 120 [P.O.:120] Out: -   strength out of 5 wound clean and dry  Lab Results: No results found for this basename: WBC, HGB, HCT, PLT,  in the last 72 hours BMET No results found for this basename: NA, K, CL, CO2, GLUCOSE, BUN, CREATININE, CALCIUM,  in the last 72 hours  Studies/Results: Dg Lumbar Spine 2-3 Views  08/06/2014   CLINICAL DATA:  L4-S1 PLIF  EXAM: DG C-ARM GT 120 MIN; LUMBAR SPINE - 2-3 VIEW  FLUOROSCOPY TIME:  4 min 20 seconds  COMPARISON:  Two digital C-arm fluoroscopic images obtained intraoperatively are compared to MRI lumbar spine 05/30/2014  FINDINGS: Five lumbar vertebrae labeled on prior MR, with at a partially lumbarized S1 segment.  BILATERAL pedicle screws and bars present at L4-S1 post PLIF.  Disc prostheses present at L4-L5 and L5-S1 disc spaces.  Bones appear demineralized.  No fracture or subluxation identified.  IMPRESSION: Posterior fusion of L4-S1.   Electronically Signed   By: Lavonia Dana M.D.   On: 08/06/2014 17:54   Dg C-arm Gt 120 Min  08/06/2014   CLINICAL DATA:  L4-S1 PLIF  EXAM: DG C-ARM GT 120 MIN; LUMBAR SPINE - 2-3 VIEW  FLUOROSCOPY TIME:  4 min 20 seconds  COMPARISON:  Two digital C-arm fluoroscopic images obtained intraoperatively are  compared to MRI lumbar spine 05/30/2014  FINDINGS: Five lumbar vertebrae labeled on prior MR, with at a partially lumbarized S1 segment.  BILATERAL pedicle screws and bars present at L4-S1 post PLIF.  Disc prostheses present at L4-L5 and L5-S1 disc spaces.  Bones appear demineralized.  No fracture or subluxation identified.  IMPRESSION: Posterior fusion of L4-S1.   Electronically Signed   By: Lavonia Dana M.D.   On: 08/06/2014 17:54    Assessment/Plan: Postop day 1 from a lumbar fusion progressive mobilization today with physical and occupational therapy had a little bit of hypotension this morning we'll give her fluid bolus   LOS: 1 day     Stein Windhorst P 08/07/2014, 8:41 AM

## 2014-08-08 MED ORDER — BISACODYL 10 MG RE SUPP
10.0000 mg | Freq: Once | RECTAL | Status: AC
  Administered 2014-08-08: 10 mg via RECTAL
  Filled 2014-08-08: qty 1

## 2014-08-08 MED ORDER — FLEET ENEMA 7-19 GM/118ML RE ENEM
1.0000 | ENEMA | Freq: Once | RECTAL | Status: AC
  Administered 2014-08-08: 1 via RECTAL
  Filled 2014-08-08: qty 1

## 2014-08-08 NOTE — Progress Notes (Signed)
OT Cancellation Note  Patient Details Name: Monique Miller MRN: [id number] DOB: 16-Oct-1951   Cancelled Treatment:    Reason Eval/Treat Not Completed: Fatigue/lethargy limiting ability to participate. Pt unable to keep eyes open for session. Will try to check back later.  Benito Mccreedy OTR/L [phone] 08/08/2014, 10:40 AM

## 2014-08-08 NOTE — Progress Notes (Signed)
Patient keep c/o nausea after being medicated, MD called to notify.

## 2014-08-08 NOTE — Progress Notes (Signed)
Occupational Therapy Treatment Patient Details Name: Monique Miller MRN: [id number] DOB: 05-13-1951 Today's Date: 08/08/2014    History of present illness L4/5 L5/S1 PLIF   OT comments  Pt limited in session due to dizziness/nausea. Education provided.   Follow Up Recommendations  No OT follow up;Supervision - Intermittent    Equipment Recommendations  3 in 1 bedside comode    Recommendations for Other Services      Precautions / Restrictions Precautions Precautions: Back Precaution Comments: Reviewed precautions Required Braces or Orthoses: Spinal Brace Spinal Brace: Lumbar corset;Applied in sitting position Restrictions Weight Bearing Restrictions: No       Mobility Bed Mobility Overal bed mobility: Needs Assistance Bed Mobility: Rolling;Sidelying to Sit;Sit to Sidelying Rolling: Min guard Sidelying to sit: Mod assist     Sit to sidelying: Min guard General bed mobility comments: assist with trunk from sidelying to sitting position.  Transfers Overall transfer level: Needs assistance Equipment used: Rolling walker (2 wheeled) Transfers: Sit to/from Stand Sit to Stand: Min guard         General transfer comment: cues for hand placement.    Balance                                   ADL Overall ADL's : Needs assistance/impaired                 Upper Body Dressing : Sitting;Total assistance   Lower Body Dressing: Sitting/lateral leans;Total assistance (socks)   Toilet Transfer: Min guard;Ambulation;RW           Functional mobility during ADLs: Min guard;Rolling walker General ADL Comments: Educated on back brace. Pt in pain-OT assisting extensively with back brace and had difficulty fastening it (I think due to swollen abdomen). Nurse came in and OT told this to her. Spouse asked about toilet hygiene and OT explained what pt could use for toilet aide. Pt became nausous during short ambulation in room.  explained how moving is  beneficial.      Vision                     Perception     Praxis      Cognition   Behavior During Therapy: WFL for tasks assessed/performed Overall Cognitive Status: Within Functional Limits for tasks assessed                       Extremity/Trunk Assessment               Exercises     Shoulder Instructions       General Comments      Pertinent Vitals/ Pain       Pain Assessment: 0-10 Pain Score: 10-Worst pain ever Pain Location: back Pain Intervention(s): Repositioned  Home Living                                          Prior Functioning/Environment              Frequency Min 2X/week     Progress Toward Goals  OT Goals(current goals can now be found in the care plan section)  Progress towards OT goals: Not progressing toward goals - comment  Acute Rehab OT Goals Patient Stated Goal: not stated OT Goal Formulation: With patient Time For Goal  Achievement: 08/14/14 Potential to Achieve Goals: Good ADL Goals Pt Will Perform Grooming: with modified independence;standing Pt Will Transfer to Toilet: with modified independence;ambulating (3 in 1 over commode) Pt Will Perform Toileting - Clothing Manipulation and hygiene: with modified independence;sit to/from stand;with adaptive equipment Additional ADL Goal #1: Pt will independently verbalize and demonstrate 3/3 back precautions.  Plan Discharge plan remains appropriate    Co-evaluation                 End of Session Equipment Utilized During Treatment: Gait belt;Rolling walker;Back brace   Activity Tolerance Other (comment) (nausea; dizziness)   Patient Left in bed;with call bell/phone within reach;with bed alarm set;with family/visitor present   Nurse Communication Mobility status        Time: 1478-2956 OT Time Calculation (min): 21 min  Charges: OT General Charges $OT Visit: 1 Procedure OT Treatments $Self Care/Home Management : 8-22  mins   Benito Mccreedy OTR/L       [phone]  08/08/2014, 3:50 PM

## 2014-08-08 NOTE — Progress Notes (Signed)
Patient ID: Monique Miller, female   DOB: [birth year], 63 y.o.   MRN: [id number] Patient is having urinary retention and is feeling nauseous. Low back and legs feel okay Dressing is intact, Hemovac drainage still moderate Plan reinsert Foley catheter Continue supportive care

## 2014-08-08 NOTE — Progress Notes (Signed)
MD called back, order received for supository

## 2014-08-08 NOTE — Progress Notes (Signed)
PT Cancellation Note  Patient Details Name: Annaelle Kasel MRN: [id number] DOB: Oct 13, 1951   Cancelled Treatment:     PT session cancelled due to pt getting ready to get bathed by NT & pt requesting to hold therapy.  Pt still lethargic but improving.  Will f/u tomorrow.     Sarajane Marek, [place] [phone] 08/08/2014

## 2014-08-09 NOTE — Progress Notes (Signed)
UR complete.  Aneesa Romey RN, MSN 

## 2014-08-09 NOTE — Progress Notes (Signed)
Patient ID: Monique Miller, female   DOB: [birth year], 63 y.o.   MRN: [id number] Afeb, vss Actually looks pretty good, but has not done much since surgery. Will d/c drain, foley, and start to increase activity. Wound looks good. Pain decreasing.

## 2014-08-09 NOTE — Progress Notes (Signed)
Physical Therapy Treatment Patient Details Name: Monique Miller MRN: [id number] DOB: [birth year] Today's Date: 08/09/2014    History of Present Illness 63 y.o. female s/p L4/5 L5/S1 PLIF    PT Comments    Pt progressing towards physical therapy goals however limited during therapy today due to increased lethargy. Able to ambulate up to 60 feet with min assist for walker control. Practiced safe mobility while maintaining back precautions. Unable to practice stairs today as it would not be safe at this time due to her lethargy and lack of ability to safely follow commands. Patient will continue to benefit from skilled physical therapy services to further improve independence with functional mobility.   Follow Up Recommendations  Outpatient PT     Equipment Recommendations       Recommendations for Other Services       Precautions / Restrictions Precautions Precautions: Back Precaution Comments: Reviewed back precautions Required Braces or Orthoses: Spinal Brace Spinal Brace: Lumbar corset;Applied in sitting position Restrictions Weight Bearing Restrictions: No    Mobility  Bed Mobility Overal bed mobility: Needs Assistance Bed Mobility: Rolling;Sidelying to Sit;Sit to Sidelying Rolling: Min assist Sidelying to sit: Min assist     Sit to sidelying: Min assist General bed mobility comments: Min assist for rolling and sit<>supine transition while maintaining back precautions. Reviewed log roll technique. Performed towards right side of bed similar to home environment  Transfers Overall transfer level: Needs assistance Equipment used: Rolling walker (2 wheeled) Transfers: Sit to/from Stand Sit to Stand: Min assist         General transfer comment: Min assist to block rolling walker. Vc for hand placement. Safely maintains back precautions while performing sit<>stand.  Ambulation/Gait Ambulation/Gait assistance: Min assist Ambulation Distance (Feet): 60 Feet Assistive  device: Rolling walker (2 wheeled) Gait Pattern/deviations: Step-through pattern;Decreased step length - right;Decreased step length - left;Decreased stride length;Decreased dorsiflexion - right;Decreased dorsiflexion - left;Shuffle;Drifts right/left   Gait velocity interpretation: Below normal speed for age/gender General Gait Details: Educated on safe DME use with rolling walker. VC for upright posture and to increase foot clearance. Min assist for walker placement; poor awareness of surroundings, frequently bumping into things and drifting with walker.   Stairs Stairs:  (Not safe to perform today)          Wheelchair Mobility    Modified Rankin (Stroke Patients Only)       Balance                                    Cognition Arousal/Alertness: Lethargic Behavior During Therapy: Flat affect Overall Cognitive Status: Impaired/Different from baseline Area of Impairment: Attention;Memory;Following commands;Problem solving   Current Attention Level: Alternating Memory: Decreased recall of precautions;Decreased short-term memory Following Commands: Follows one step commands inconsistently;Follows one step commands with increased time     Problem Solving: Slow processing;Difficulty sequencing;Requires verbal cues;Requires tactile cues General Comments: Pt very lethargic.    Exercises      General Comments General comments (skin integrity, edema, etc.): Pt very lethargic today limiting therapy treatment and ambulatory distance. SpO2 96% on room air at end of session.      Pertinent Vitals/Pain Pain Assessment: 0-10 Pain Score:  (no value given - pt difficulty answering questions) Pain Intervention(s): Monitored during session;Repositioned    Home Living                      Prior Function  PT Goals (current goals can now be found in the care plan section) Acute Rehab PT Goals PT Goal Formulation: With patient Time For Goal  Achievement: 08/14/14 Potential to Achieve Goals: Good Progress towards PT goals: Progressing toward goals    Frequency  Min 6X/week    PT Plan Current plan remains appropriate    Co-evaluation             End of Session Equipment Utilized During Treatment: Back brace Activity Tolerance: Patient limited by lethargy Patient left: in bed;with call bell/phone within reach;with nursing/sitter in room;with family/visitor present (HOB 20-30 degrees)     Time: 4818-5631 PT Time Calculation (min): 24 min  Charges:  $Gait Training: 8-22 mins $Therapeutic Activity: 8-22 mins                    G Codes:      [employer], [place]  Monique Miller 08/09/2014, 10:05 AM

## 2014-08-10 MED ORDER — DEXAMETHASONE 4 MG PO TABS
6.0000 mg | ORAL_TABLET | ORAL | Status: AC
  Administered 2014-08-10 (×3): 6 mg via ORAL
  Filled 2014-08-10 (×6): qty 1

## 2014-08-10 NOTE — Progress Notes (Signed)
Occupational Therapy Treatment Patient Details Name: Monick Rena MRN: [id number] DOB: 01/01/1951 Today's Date: 08/10/2014    History of present illness 63 y.o. female s/p L4/5 L5/S1 PLIF   OT comments  Pt progressing. Education provided. Spouse plans to assist with ADLs as needed.  Follow Up Recommendations  No OT follow up;Supervision - Intermittent    Equipment Recommendations  3 in 1 bedside comode    Recommendations for Other Services      Precautions / Restrictions Precautions Precautions: Back Precaution Comments: Reviewed back precautions Required Braces or Orthoses: Spinal Brace Spinal Brace: Lumbar corset;Applied in sitting position Restrictions Weight Bearing Restrictions: No       Mobility Bed Mobility Overal bed mobility: Needs Assistance Bed Mobility: Rolling;Sidelying to Sit;Sit to Sidelying Rolling: Supervision Sidelying to sit: Supervision     Sit to sidelying: Supervision General bed mobility comments: cues for technique.  Transfers Overall transfer level: Needs assistance Equipment used: Rolling walker (2 wheeled) Transfers: Sit to/from Stand Sit to Stand: Min guard         General transfer comment: cues for hand placement.    Balance                                   ADL Overall ADL's : Needs assistance/impaired     Grooming: Oral care;Min guard;Standing           Upper Body Dressing : Sitting;Set up;Supervision/safety (caregiver donned brace/pt doffed)       Toilet Transfer: Ambulation;Min guard;RW (bed)           Functional mobility during ADLs: Min guard;Rolling walker General ADL Comments: Discussed car transfer technique. Reviewed use of bag on walker. Discussed back brace and use of cup for teeth care as well as placement of grooming items to avoid breaking precautions. Family asked about exercise program and OT advised to wait on UB strengthening at this time.       Vision                      Perception     Praxis      Cognition   Behavior During Therapy: WFL for tasks assessed/performed Overall Cognitive Status: Within Functional Limits for tasks assessed                       Extremity/Trunk Assessment               Exercises     Shoulder Instructions       General Comments      Pertinent Vitals/ Pain       Pain Assessment: 0-10 Pain Score: 8  Pain Location: back, right leg, buttocks Pain Intervention(s): Monitored during session;Limited activity within patient's tolerance  Home Living                                          Prior Functioning/Environment              Frequency Min 2X/week     Progress Toward Goals  OT Goals(current goals can now be found in the care plan section)  Progress towards OT goals: Progressing toward goals  Acute Rehab OT Goals Patient Stated Goal: not stated OT Goal Formulation: With patient Time For Goal Achievement: 08/14/14 Potential to Achieve Goals: Good  ADL Goals Pt Will Perform Grooming: with modified independence;standing Pt Will Transfer to Toilet: with modified independence;ambulating (3 in 1 over commode) Pt Will Perform Toileting - Clothing Manipulation and hygiene: with modified independence;sit to/from stand;with adaptive equipment Additional ADL Goal #1: Pt will independently verbalize and demonstrate 3/3 back precautions.  Plan Discharge plan remains appropriate    Co-evaluation                 End of Session Equipment Utilized During Treatment: Gait belt;Rolling walker;Back brace   Activity Tolerance Patient limited by pain   Patient Left with call bell/phone within reach;in bed;with bed alarm set;with family/visitor present   Nurse Communication          Time: 7867-6720 OT Time Calculation (min): 16 min  Charges: OT General Charges $OT Visit: 1 Procedure OT Treatments $Self Care/Home Management : 8-22 mins  Benito Mccreedy  OTR/L [phone] 08/10/2014, 11:05 AM

## 2014-08-10 NOTE — Progress Notes (Signed)
Patient ID: Monique Miller, female   DOB: Jan 31, 1951, 63 y.o.   MRN: [id number] Afeb, vss No new neuro issues. Has a lot of vague complaints about pain and weakness. Her strength is 5/5 on individual muscle testing.  Will try a few doses of decadron and see if that helps.

## 2014-08-10 NOTE — Progress Notes (Signed)
Physical Therapy Treatment Patient Details Name: Monique Miller MRN: [id number] DOB: 1951-08-29 Today's Date: 08/10/2014    History of Present Illness 63 y.o. female s/p L4/5 L5/S1 PLIF    PT Comments    Pt progressing slowly due to pain control.  Slowly assisted OOB, applied brace EOB and amb limited distance in hallway.  Great difficulty tolerating session with reports 9/10 back pain and R LE shooting pain that did not decrease with activity.   Follow Up Recommendations  Outpatient PT     Equipment Recommendations       Recommendations for Other Services       Precautions / Restrictions Precautions Precautions: Back Precaution Comments: Reviewed back precautions Required Braces or Orthoses: Spinal Brace Spinal Brace: Lumbar corset;Applied in sitting position Restrictions Weight Bearing Restrictions: No    Mobility  Bed Mobility Overal bed mobility: Needs Assistance Bed Mobility: Rolling;Sidelying to Sit;Sit to Sidelying Rolling: Supervision;Min guard Sidelying to sit: Supervision;Min guard     Sit to sidelying: Supervision;Min guard General bed mobility comments: increased time  Transfers Overall transfer level: Needs assistance Equipment used: Rolling walker (2 wheeled) Transfers: Sit to/from Stand Sit to Stand: Min guard;Min assist         General transfer comment: increased time  Ambulation/Gait Ambulation/Gait assistance: Min assist Ambulation Distance (Feet): 35 Feet Assistive device: Rolling walker (2 wheeled) Gait Pattern/deviations: Step-to pattern;Decreased stride length;Decreased stance time - right;Trunk flexed Gait velocity: decreased   General Gait Details: great difficulty tolerating amb due to current pain level and R LE shooting pain.   Stairs            Wheelchair Mobility    Modified Rankin (Stroke Patients Only)       Balance                                    Cognition Arousal/Alertness:  Awake/alert Behavior During Therapy: WFL for tasks assessed/performed Overall Cognitive Status: Within Functional Limits for tasks assessed                      Exercises      General Comments        Pertinent Vitals/Pain Pain Assessment: 0-10 Pain Score: 9  Pain Location: back and R LE Pain Descriptors / Indicators: Constant;Cramping;Discomfort Pain Intervention(s): Monitored during session;Repositioned    Home Living                      Prior Function            PT Goals (current goals can now be found in the care plan section) Acute Rehab PT Goals Patient Stated Goal: not stated Progress towards PT goals: Progressing toward goals    Frequency  Min 6X/week    PT Plan      Co-evaluation             End of Session Equipment Utilized During Treatment: Back brace Activity Tolerance: Patient limited by pain Patient left: in bed;with call bell/phone within reach;with nursing/sitter in room;with family/visitor present     Time: 0912-0935 PT Time Calculation (min): 23 min  Charges:  $Gait Training: 8-22 mins $Therapeutic Activity: 8-22 mins                    G Codes:      Rica Koyanagi  PTA [facility]  Acute  Rehab Pager  [phone]  

## 2014-08-11 MED ORDER — OXYCODONE-ACETAMINOPHEN 5-325 MG PO TABS: 1.0000 | ORAL_TABLET | ORAL | Status: DC | PRN

## 2014-08-11 NOTE — Progress Notes (Signed)
Physical Therapy Treatment Patient Details Name: Monique Miller MRN: [id number] DOB: 23-Oct-1951 Today's Date: 08/11/2014    History of Present Illness 63 y.o. female s/p L4/5 L5/S1 PLIF    PT Comments    Pt at supervision for all mobility. Pt requesting to attempt gt without RW this session, pt in much more pain. Recommend use of RW for mobility to off weight Rt LE. Hopeful to D/C home today.   Follow Up Recommendations  Outpatient PT     Equipment Recommendations  Rolling walker with 5" wheels    Recommendations for Other Services       Precautions / Restrictions Precautions Precautions: Back Precaution Comments: pt able to recall 2/3 back precautions independently; reviewed precautions throughout session  Required Braces or Orthoses: Spinal Brace Spinal Brace: Lumbar corset;Applied in sitting position Restrictions Weight Bearing Restrictions: No    Mobility  Bed Mobility Overal bed mobility: Modified Independent Bed Mobility: Rolling;Sit to Sidelying;Sidelying to Sit           General bed mobility comments: good log rolling technique noted   Transfers Overall transfer level: Modified independent Equipment used: None Transfers: Sit to/from Stand           General transfer comment: stood without use of UE support; incr time due to pain   Ambulation/Gait Ambulation/Gait assistance: Supervision Ambulation Distance (Feet): 120 Feet Assistive device: None Gait Pattern/deviations: Step-through pattern;Decreased stance time - right;Antalgic;Wide base of support Gait velocity: decreased due to pain in Rt LE Gait velocity interpretation: Below normal speed for age/gender General Gait Details: pt requesting to ambulate without RW; pt in incr pain in Rt LE; tearful by end of session; no LOB noted; supervision for safety; encouraged to ambulate with RW   Stairs Stairs: Yes Stairs assistance: Supervision Stair Management: One rail Left;Step to  pattern;Forwards Number of Stairs: 5 General stair comments: cues for safe sequencing   Wheelchair Mobility    Modified Rankin (Stroke Patients Only)       Balance Overall balance assessment: No apparent balance deficits (not formally assessed)                                  Cognition Arousal/Alertness: Awake/alert Behavior During Therapy: WFL for tasks assessed/performed Overall Cognitive Status: Within Functional Limits for tasks assessed       Memory: Decreased recall of precautions         General Comments: reviewed "BAT" precautions; pt kept thinking it was "RAT"    Exercises      General Comments General comments (skin integrity, edema, etc.): educated on car transfer technique       Pertinent Vitals/Pain Pain Assessment: 0-10 Pain Score: 8  Pain Location: Rt hip/leg Pain Descriptors / Indicators: Shooting;Spasm Pain Intervention(s): Limited activity within patient's tolerance;Repositioned;Ice applied    Home Living                      Prior Function            PT Goals (current goals can now be found in the care plan section) Acute Rehab PT Goals Patient Stated Goal: to go home today PT Goal Formulation: With patient Time For Goal Achievement: 08/14/14 Potential to Achieve Goals: Good Progress towards PT goals: Progressing toward goals    Frequency  Min 5X/week    PT Plan Current plan remains appropriate    Co-evaluation  End of Session Equipment Utilized During Treatment: Back brace Activity Tolerance: Patient limited by pain Patient left: in bed;with call bell/phone within reach     Time: 0845-0858 PT Time Calculation (min): 13 min  Charges:  $Gait Training: 8-22 mins                    G Codes:      Elie Confer [place], [place] 08/11/2014, 9:35 AM

## 2014-08-11 NOTE — Discharge Summary (Signed)
  Physician Discharge Summary  Patient ID: Monique Miller MRN: [id number] DOB/AGE: Apr 07, 1951 63 y.o.  Admit date: 08/06/2014 Discharge date: 08/11/2014  Admission Diagnoses:  Discharge Diagnoses:  Active Problems:   Spinal stenosis of lumbar region at multiple levels   Discharged Condition: good  Hospital Course: Surgery 5 days ago for 2 level PLIF. Did well. Slow to increase activity. Wound healed well. By pod 5, ambulating well. Wound healing fine. Home with specific instructions given.  Consults: None  Significant Diagnostic Studies: none  Treatments: surgery: L 45 L5S1 plif  Discharge Exam: Blood pressure 140/63, pulse 69, temperature 98.4 F (36.9 C), temperature source Oral, resp. rate 18, height 5\' 5"  (1.651 m), weight 75.116 kg (165 lb 9.6 oz), SpO2 100.00%. Incision/Wound:clean and dry; no new neuro issues  Disposition: Final discharge disposition not confirmed     Medication List    ASK your doctor about these medications       ALPRAZolam 1 MG tablet  Commonly known as:  XANAX  Take 1 mg by mouth at bedtime.     amphetamine-dextroamphetamine 15 MG tablet  Commonly known as:  ADDERALL  Take 15 mg by mouth 2 (two) times daily. 1 tablet 2-3 times daily     bisacodyl 5 MG EC tablet  Commonly known as:  DULCOLAX  Take 5 mg by mouth daily.     CYMBALTA 60 MG capsule  Generic drug:  DULoxetine  Take 120 mg by mouth daily.     fexofenadine 180 MG tablet  Commonly known as:  ALLEGRA  Take 180 mg by mouth daily.     meloxicam 15 MG tablet  Commonly known as:  MOBIC  Take 15 mg by mouth daily.         At home rest most of the time. Get up 9 or 10 times each day and take a 15 or 20 minute walk. No riding in the car and to your first postoperative appointment. If you have neck surgery you may shower from the chest down starting on the third postoperative day. If you had back surgery he may start showering on the third postoperative day with saran wrap  wrapped around your incisional area 3 times. After the shower remove the saran wrap. Take pain medicine as needed and other medications as instructed. Call my office for an appointment.  SignedFaythe Ghee, MD 08/11/2014, 8:15 AM

## 2014-08-11 NOTE — Progress Notes (Signed)
Discharge orders received. Pt for discharge home today. IV d/c'd. Dressing clean, dry, intact to lower back. Pt given discharge instructions and prescriptions with verbalized understanding. Family in room to assist with discharge. Staff brought pt downstairs via wheelchair.

## 2014-09-22 ENCOUNTER — Other Ambulatory Visit: Payer: Self-pay

## 2014-09-22 DIAGNOSIS — Z1231 Encounter for screening mammogram for malignant neoplasm of breast: Secondary | ICD-10-CM

## 2014-10-11 ENCOUNTER — Other Ambulatory Visit: Payer: Self-pay

## 2014-10-11 ENCOUNTER — Ambulatory Visit
Admission: RE | Admit: 2014-10-11 | Discharge: 2014-10-11 | Disposition: A | Discharge status: Home / Self Care | Payer: BC Managed Care – PPO | Source: Ambulatory Visit

## 2014-10-11 DIAGNOSIS — Z1231 Encounter for screening mammogram for malignant neoplasm of breast: Secondary | ICD-10-CM

## 2014-12-30 ENCOUNTER — Telehealth: Payer: Self-pay | Admitting: Family

## 2014-12-30 NOTE — Telephone Encounter (Signed)
Pt is asking if Monique Miller can give her a physical may I schedule her for 01/06/15.

## 2014-12-30 NOTE — Telephone Encounter (Signed)
Okay to schedule. Pt should be aware she will need to establish with another provider going further

## 2015-01-06 ENCOUNTER — Ambulatory Visit (INDEPENDENT_AMBULATORY_CARE_PROVIDER_SITE_OTHER): Payer: BC Managed Care – PPO | Admitting: Family

## 2015-01-06 ENCOUNTER — Encounter: Payer: Self-pay | Admitting: Family

## 2015-01-06 ENCOUNTER — Other Ambulatory Visit (HOSPITAL_COMMUNITY)
Admission: RE | Admit: 2015-01-06 | Discharge: 2015-01-06 | Disposition: A | Discharge status: Home / Self Care | Payer: BC Managed Care – PPO | Source: Ambulatory Visit | Attending: Family | Admitting: Family

## 2015-01-06 VITALS — BP 138/70 | HR 72 | Temp 98.6°F | Wt 154.4 lb

## 2015-01-06 DIAGNOSIS — Z124 Encounter for screening for malignant neoplasm of cervix: Secondary | ICD-10-CM

## 2015-01-06 DIAGNOSIS — Z01419 Encounter for gynecological examination (general) (routine) without abnormal findings: Secondary | ICD-10-CM | POA: Diagnosis present

## 2015-01-06 DIAGNOSIS — Z0000 Encounter for general adult medical examination without abnormal findings: Secondary | ICD-10-CM

## 2015-01-06 LAB — CBC WITH DIFFERENTIAL/PLATELET
Basophils Absolute: 0.1 10*3/uL (ref 0.0–0.1)
Basophils Relative: 0.8 % (ref 0.0–3.0)
Eosinophils Absolute: 0.1 10*3/uL (ref 0.0–0.7)
Eosinophils Relative: 1.7 % (ref 0.0–5.0)
HEMATOCRIT: 40.5 % (ref 36.0–46.0)
Hemoglobin: 13.5 g/dL (ref 12.0–15.0)
LYMPHS PCT: 32.5 % (ref 12.0–46.0)
Lymphs Abs: 2.2 10*3/uL (ref 0.7–4.0)
MCHC: 33.4 g/dL (ref 30.0–36.0)
MCV: 85.3 fl (ref 78.0–100.0)
Monocytes Absolute: 0.6 10*3/uL (ref 0.1–1.0)
Monocytes Relative: 8.9 % (ref 3.0–12.0)
Neutro Abs: 3.8 10*3/uL (ref 1.4–7.7)
Neutrophils Relative %: 56.1 % (ref 43.0–77.0)
PLATELETS: 265 10*3/uL (ref 150.0–400.0)
RBC: 4.74 Mil/uL (ref 3.87–5.11)
RDW: 13.7 % (ref 11.5–15.5)
WBC: 6.9 10*3/uL (ref 4.0–10.5)

## 2015-01-06 LAB — LIPID PANEL
CHOLESTEROL: 166 mg/dL (ref 0–200)
HDL: 62.5 mg/dL (ref >39.00)
LDL Cholesterol: 92 mg/dL (ref 0–99)
NonHDL: 103.5
TRIGLYCERIDES: 59 mg/dL (ref 0.0–149.0)
Total CHOL/HDL Ratio: 3
VLDL: 11.8 mg/dL (ref 0.0–40.0)

## 2015-01-06 LAB — COMPREHENSIVE METABOLIC PANEL
ALBUMIN: 4.3 g/dL (ref 3.5–5.2)
ALT: 14 U/L (ref 0–35)
AST: 20 U/L (ref 0–37)
Alkaline Phosphatase: 60 U/L (ref 39–117)
BUN: 18 mg/dL (ref 6–23)
CO2: 29 meq/L (ref 19–32)
CREATININE: 0.96 mg/dL (ref 0.40–1.20)
Calcium: 9.4 mg/dL (ref 8.4–10.5)
Chloride: 100 mEq/L (ref 96–112)
GFR: 62.74 mL/min (ref >60.00)
GLUCOSE: 95 mg/dL (ref 70–99)
Potassium: 4.2 mEq/L (ref 3.5–5.1)
SODIUM: 135 meq/L (ref 135–145)
Total Bilirubin: 0.5 mg/dL (ref 0.2–1.2)
Total Protein: 6.7 g/dL (ref 6.0–8.3)

## 2015-01-06 LAB — POCT URINALYSIS DIPSTICK
Bilirubin, UA: NEGATIVE
Blood, UA: NEGATIVE
GLUCOSE UA: NEGATIVE
KETONES UA: NEGATIVE
Nitrite, UA: NEGATIVE
Protein, UA: NEGATIVE
UROBILINOGEN UA: 0.2
pH, UA: 6.5

## 2015-01-06 LAB — TSH: TSH: 1.88 u[IU]/mL (ref 0.35–4.50)

## 2015-01-06 MED ORDER — FLIBANSERIN 100 MG PO TABS: 100.0000 mg | ORAL_TABLET | Freq: Every day | ORAL | Status: DC

## 2015-01-06 MED ORDER — TOLTERODINE TARTRATE 1 MG PO TABS: 2.0000 mg | ORAL_TABLET | Freq: Two times a day (BID) | ORAL | Status: DC

## 2015-01-06 NOTE — Patient Instructions (Signed)
Urinary Incontinence Urinary incontinence is the involuntary loss of urine from your bladder. CAUSES  There are many causes of urinary incontinence. They include:  Medicines.  Infections.  Prostatic enlargement, leading to overflow of urine from your bladder.  Surgery.  Neurological diseases.  Emotional factors. SIGNS AND SYMPTOMS Urinary Incontinence can be divided into four types: 1. Urge incontinence. Urge incontinence is the involuntary loss of urine before you have the opportunity to go to the bathroom. There is a sudden urge to void but not enough time to reach a bathroom. 2. Stress incontinence. Stress incontinence is the sudden loss of urine with any activity that forces urine to pass. It is commonly caused by anatomical changes to the pelvis and sphincter areas of your body. 3. Overflow incontinence. Overflow incontinence is the loss of urine from an obstructed opening to your bladder. This results in a backup of urine and a resultant buildup of pressure within the bladder. When the pressure within the bladder exceeds the closing pressure of the sphincter, the urine overflows, which causes incontinence, similar to water overflowing a dam. 4. Total incontinence. Total incontinence is the loss of urine as a result of the inability to store urine within your bladder. DIAGNOSIS  Evaluating the cause of incontinence may require:  A thorough and complete medical and obstetric history.  A complete physical exam.  Laboratory tests such as a urine culture and sensitivities. When additional tests are indicated, they can include:  An ultrasound exam.  Kidney and bladder X-rays.  Cystoscopy. This is an exam of the bladder using a narrow scope.  Urodynamic testing to test the nerve function to the bladder and sphincter areas. TREATMENT  Treatment for urinary incontinence depends on the cause:  For urge incontinence caused by a bacterial infection, antibiotics will be prescribed.  If the urge incontinence is related to medicines you take, your health care provider may have you change the medicine.  For stress incontinence, surgery to re-establish anatomical support to the bladder or sphincter, or both, will often correct the condition.  For overflow incontinence caused by an enlarged prostate, an operation to open the channel through the enlarged prostate will allow the flow of urine out of the bladder. In women with fibroids, a hysterectomy may be recommended.  For total incontinence, surgery on your urinary sphincter may help. An artificial urinary sphincter (an inflatable cuff placed around the urethra) may be required. In women who have developed a hole-like passage between their bladder and vagina (vesicovaginal fistula), surgery to close the fistula often is required. HOME CARE INSTRUCTIONS  Normal daily hygiene and the use of pads or adult diapers that are changed regularly will help prevent odors and skin damage.  Avoid caffeine. It can overstimulate your bladder.  Use the bathroom regularly. Try about every 2-3 hours to go to the bathroom, even if you do not feel the need to do so. Take time to empty your bladder completely. After urinating, wait a minute. Then try to urinate again.  For causes involving nerve dysfunction, keep a log of the medicines you take and a journal of the times you go to the bathroom. SEEK MEDICAL CARE IF:  You experience worsening of pain instead of improvement in pain after your procedure.  Your incontinence becomes worse instead of better. SEE IMMEDIATE MEDICAL CARE IF:  You experience fever or shaking chills.  You are unable to pass your urine.  You have redness spreading into your groin or down into your thighs. MAKE SURE   YOU:   Understand these instructions.   Will watch your condition.  Will get help right away if you are not doing well or get worse. Document Released: 12/06/2004 Document Revised: 08/19/2013 Document  Reviewed: 04/07/2013 [employer] Patient Information 2015 [employer]. This information is not intended to replace advice given to you by your health care provider. Make sure you discuss any questions you have with your health care provider.  

## 2015-01-06 NOTE — Progress Notes (Signed)
Subjective:    Patient ID: Monique Miller, female    DOB: 08/01/1951, 64 y.o.   MRN: [id number]  HPI Patient 64 year old, Caucasian female, seen today for complete physical exam with PAP.  New concerns as follows:  Describes a sensation of inability to hold urine.  She describes experiencing urine leakage when approaching bathroom. Incontinent episodes are not aggravated by emotions or activity. Denies burning, odor, or color change in urine. Denies CVA tenderness.  Patient described a loss of desire to engage in sexual intercourse with spouse.  Requests treatment with medication if available to aid with improving desire. Denies pain or vaginal irritation.   Patient reports exercise multiple days per week.  She performs self breast exams monthly.    Review of Systems  Constitutional: Negative.   HENT: Negative.   Eyes: Negative.   Respiratory: Negative.   Cardiovascular: Negative.   Gastrointestinal: Negative.   Endocrine: Negative.   Genitourinary: Positive for urgency.       Urge incontinence-See HPI   Skin: Negative.   Allergic/Immunologic: Negative.   Neurological: Negative.   Hematological: Negative.   Psychiatric/Behavioral: Negative.        Sexual dysfunction-see HPI   . Past Medical History  Diagnosis Date  . Depression     see Meridith Child psychotherapist and Trey Paula  . Allergy   . Hx of migraines   . Thyroid disease     hx of hypothyroid  . Grover's disease   . Anxiety   . Restless leg syndrome   . Pneumonia     walking pneumonia  . Arthritis   . Cancer     skin cancer (basal cell)  . Anemia     "many years ago"  . Constipation by delayed colonic transit     History   Social History  . Marital Status: Married    Spouse Name: N/A  . Number of Children: N/A  . Years of Education: N/A   Occupational History  . Not on file.   Social History Main Topics  . Smoking status: Never Smoker   . Smokeless tobacco: Never Used  . Alcohol Use: 4.2 oz/week    7  Glasses of wine per week     Comment: occassionally  . Drug Use: No  . Sexual Activity: Not on file   Other Topics Concern  . Not on file   Social History Narrative    Past Surgical History  Procedure Laterality Date  . Cesarean section      x2  . Appendectomy    . Breast surgery      augmentation  . Colonoscopy      Family History  Problem Relation Age of Onset  . Cancer Mother     pancreatic  . Cancer Maternal Uncle     pancreatic  . Cancer Maternal Grandmother     pancreatic    No Known Allergies  Current Outpatient Prescriptions on File Prior to Visit  Medication Sig Dispense Refill  . ALPRAZolam (XANAX) 1 MG tablet Take 1 mg by mouth at bedtime.     Marland Kitchen amphetamine-dextroamphetamine (ADDERALL) 15 MG tablet Take 15 mg by mouth 2 (two) times daily. 1 tablet 2-3 times daily    . CYMBALTA 60 MG capsule Take 120 mg by mouth daily.     . fexofenadine (ALLEGRA) 180 MG tablet Take 180 mg by mouth daily.     No current facility-administered medications on file prior to visit.    BP 138/70 mmHg  Pulse 72  Temp(Src) 98.6 F (37 C) (Oral)  Wt 154 lb 6.4 oz (70.035 kg)  SpO2 96%chart     Objective:   Physical Exam  Constitutional: She is oriented to person, place, and time. She appears well-developed and well-nourished.  HENT:  Head: Normocephalic and atraumatic.  Right Ear: External ear normal.  Left Ear: External ear normal.  Nose: Nose normal.  Mouth/Throat: Oropharynx is clear and moist.  Eyes: Conjunctivae and EOM are normal. Pupils are equal, round, and reactive to light.  Neck: Normal range of motion. Neck supple. No JVD present. No tracheal deviation present. No thyromegaly present.  Cardiovascular: Normal rate, regular rhythm, normal heart sounds and intact distal pulses.   Pulmonary/Chest: Effort normal and breath sounds normal. No respiratory distress. She exhibits no tenderness.  Abdominal: Soft. Bowel sounds are normal. She exhibits no distension.  There is no tenderness. There is no rebound and no guarding.  Genitourinary: Vagina normal and uterus normal.  Patient refused rectal exam.  Musculoskeletal: Normal range of motion. She exhibits no tenderness.  Linear healed spinal incision from back surgery 2015  Neurological: She is alert and oriented to person, place, and time. She has normal reflexes. She displays normal reflexes. No cranial nerve deficit. Coordination normal.  Skin: Skin is warm and dry. No rash noted. No erythema. No pallor.  Psychiatric: She has a normal mood and affect. Her behavior is normal. Judgment and thought content normal.          Assessment & Plan:   Monique Miller was seen today for annual exam and urinary incontinence.  Diagnoses and all orders for this visit:  Annual physical exam Orders: -     EKG 12-Lead -     POCT urinalysis dipstick -     CMP -     CBC with Differential -     TSH -     Lipid Panel  Screening for cervical cancer Orders: -     PAP [[place]]  Other orders -     tolterodine (DETROL) 1 MG tablet; Take 2 tablets (2 mg total) by mouth 2 (two) times daily.   Plan:   Health Promotion:Patient instructed to continue self breast exam. Continue exercise 3-4 times per week for at least 45 minutes.  1. Urge Incontinence-recommended Kegel exercises and Detrol ordered if problem persists.  2. Sexual Health-Addyi prescribed to improve sexual desire. Patient advised to follow with provider if medication is ineffective.  3. Follow-up with patient regarding lab results.

## 2015-01-06 NOTE — Progress Notes (Signed)
Pre visit review using our clinic review tool, if applicable. No additional management support is needed unless otherwise documented below in the visit note. 

## 2015-01-07 LAB — CYTOLOGY - PAP

## 2015-03-28 ENCOUNTER — Ambulatory Visit: Payer: BC Managed Care – PPO | Admitting: Family Medicine

## 2015-06-21 ENCOUNTER — Other Ambulatory Visit: Payer: Self-pay

## 2015-06-21 MED ORDER — TOLTERODINE TARTRATE 1 MG PO TABS: 2.0000 mg | ORAL_TABLET | Freq: Two times a day (BID) | ORAL | Status: DC

## 2015-09-20 ENCOUNTER — Other Ambulatory Visit: Payer: Self-pay

## 2015-09-20 DIAGNOSIS — Z1231 Encounter for screening mammogram for malignant neoplasm of breast: Secondary | ICD-10-CM

## 2015-10-14 ENCOUNTER — Ambulatory Visit
Admission: RE | Admit: 2015-10-14 | Discharge: 2015-10-14 | Disposition: A | Discharge status: Home / Self Care | Payer: BC Managed Care – PPO | Source: Ambulatory Visit

## 2015-10-14 DIAGNOSIS — Z1231 Encounter for screening mammogram for malignant neoplasm of breast: Secondary | ICD-10-CM

## 2015-11-18 ENCOUNTER — Other Ambulatory Visit: Payer: Self-pay

## 2015-11-18 MED ORDER — TOLTERODINE TARTRATE 1 MG PO TABS: 2.0000 mg | ORAL_TABLET | Freq: Two times a day (BID) | ORAL | Status: DC

## 2015-11-18 NOTE — Telephone Encounter (Signed)
Rx request from [facility] for a refill of tolterodine tartrate 1 mg tablet- Take 2 tablets by mouth twice daily #60.  Called and spoke with pt about establishing care with a new PCP.  Advised pt that I saw she cancelled an appt with Dr. Maudie Mercury in May 2016.  Pt states she was not sure if Abby Potash was completely gone or if she could continue to see her.  Advised pt that at this time, Abby Potash is part time and comes into the office.  Rx sent to pharmacy for 90 day supply.

## 2016-01-10 ENCOUNTER — Ambulatory Visit: Payer: BC Managed Care – PPO | Admitting: Adult Health

## 2016-02-11 DIAGNOSIS — I214 Non-ST elevation (NSTEMI) myocardial infarction: Secondary | ICD-10-CM

## 2016-02-11 HISTORY — DX: Non-ST elevation (NSTEMI) myocardial infarction: I21.4

## 2016-03-05 DIAGNOSIS — I2510 Atherosclerotic heart disease of native coronary artery without angina pectoris: Secondary | ICD-10-CM | POA: Diagnosis present

## 2016-03-05 NOTE — H&P (Signed)
OFFICE VISIT NOTES COPIED TO EPIC FOR DOCUMENTATION   History of Present Illness Monique Page MD; Mar 26, 2016 6:29 PM) Patient words: 1 wk f/u for CAD, lab results; last office visit 02/20/2016.  The patient is a 65 year old female who presents for a follow-up for Coronary artery disease. Patient previously very active and exercising on a regular basis, who started noticing chest tightness and right arm pain with activity and eventually presented on 02/15/2016 to the emergency room on the [facility] [place]. Transferred to [facility] and was diagnosed with NSTEMI and underwent angioplasty to the occluded diagonal and mid LAD. Has residual stenosis in the secondary branch of the diagonal which was left alone and a 80% proximal RCA stenosis.  She denies any history of hyperlipidemia, diabetes, hypertension, thyroid disease, or tobacco use. She does have a family history of premature CAD as her brother had an MI at age 29. Her mother died at a young age from cancer. Since diagnosis of CAD, she has also found out that she has multiple second-degree relatives with CAD as well.  She continues to report arm pain with minimal amounts of exertion and severe fatigue. I had prescribed her isosorbide mononitrate which she states helped with chest discomfort and also on discomfort but had severe headaches for which she discontinued taking the medication. She is accompanied by her husband at the bedside and is wanting to discussed regarding labs and also wants to proceed with coronary angiography. Denies any shortness of breath, PND, orthopnea, edema, palpitations, or syncope. No symptoms suggestive of claudication or TIA.   Problem List/Past Medical (April Louretta Shorten; Mar 26, 2016 12:12 PM) Family history of early CAD (Z63.49)  Brother had CAD at age 47 years. Multiple second-degree relatives with premature CAD. Anxiety and depression (F41.9, F32.9)  History of  MI (myocardial infarction) (I25.2) 02/16/2016 02/16/2016 in [place], [place] and transferred to [facility] for PTCA. History of PTCA (Z98.61) 02/16/2016 Acute MI 02/16/2016 S/P PCI to LAD and D1. Residual Proximal RCA 80% stenosis. Needs PCI Atherosclerosis of native coronary artery of native heart with angina pectoris (I25.119) 02/16/2016 Acute MI 02/16/2016 S/P PCI to LAD and D1 with the 2.75 x 23 mm Xience DES and 2.5 x 15 mm Xience DES respectively. Residual Proximal RCA 80% stenosis. Normal LVEF. Needs PCI.  Allergies (April Garrison; 26-Mar-2016 12:12 PM) No Known Drug Allergies 02/20/2016  Family History (April Garrison; Mar 26, 2016 12:12 PM) Mother  Deceased. at age 69 from Pancreatic Cancer; No known Heart conditions Father  Deceased. at age 89 from CHF; No other known Heart conditions Sister 1  Older Brother 2  Older; 1 Had an MI at age 30  Social History (April Garrison; 2016/03/26 12:12 PM) Current tobacco use  Never smoker. Alcohol Use  Moderate alcohol use. Marital status  Married. Living Situation  Lives with spouse. Number of Children  2.  Past Surgical History (April Garrison; Mar 26, 2016 12:12 PM) Appendectomy 1981 Mammoplasty; Augmentation 1983 Cesarean Delivery 1983 1981 Back Surgery 2015 L4-L5 Fusion  Medication History (April Louretta Shorten; Mar 26, 2016 12:21 PM) Isosorbide Mononitrate ER (60MG  Tablet ER 24HR, 1 (one) Tablet Oral daily, Taken starting 02/23/2016) Active. Rosuvastatin Calcium (20MG  Tablet, 1 (one) Tablet Oral daily, Taken starting 02/20/2016) Active. Brilinta (90MG  Tablet, 1 Tablet Oral two times daily, Taken starting 02/20/2016) Active. Metoprolol Tartrate (25MG  Tablet, 1 Tablet Oral two times daily, Taken starting 02/20/2016) Active. Nitroglycerin (0.4MG  Tab Sublingual, 1 Tablet Sublingual every 5 minutes as needed for chest pain., Taken  starting 02/20/2016) Active. Aspirin EC (81MG  Tablet DR, 1 Oral daily) Active. Cymbalta (60MG  Capsule DR Part, 2  Oral daily) Active. ALPRAZolam (1MG  Tablet, 1 Oral at bedtime) Active. Probiotic Daily (1 Oral daily) Active. Dulcolax (5MG  Tablet DR, 1 Oral at bedtime) Active. Allegra Allergy (180MG  Tablet, 1 Oral as needed) Active. Medications Reconciled (Verbally)  Diagnostic Studies History (April Garrison; 2016-02-29 12:12 PM) Worthy Keeler  02/20/2016: Lp(a) 345, ApoA normal at 131, ApoB normal at 101, ApoB/A ratio minimally elevated at 0.8 01/06/2015: Total cholesterol 166, triglycerides 59, HDL 62, LDL 92, TSH 1.88, CBC normal, creatinine 0.96, CMP normal Colonoscopy 2016 removed benign polyps Echocardiogram 02/15/2016 No damage to Heart Muscle found Coronary Angiogram 02/16/2016 PCI to LAD and D1 with the 2.75 x 23 mm Xience DES and 2.5 x 15 mm Xience DES respectively. Residual Proximal RCA 80% stenosis. Normal LVEF. Needs PCI.    Review of Systems Monique Page, MD; February 29, 2016 6:32 PM) General Present- Fatigue. Not Present- Anorexia and Fever. Respiratory Present- Decreased Exercise Tolerance. Not Present- Cough, Difficulty Breathing on Exertion and Dyspnea. Cardiovascular Present- Chest Pain (with radiation to arms). Not Present- Claudications, Edema, Orthopnea, Palpitations and Paroxysmal Nocturnal Dyspnea. Gastrointestinal Not Present- Black, Tarry Stool, Change in Bowel Habits and Nausea. Neurological Not Present- Focal Neurological Symptoms and Syncope. Endocrine Not Present- Cold Intolerance, Excessive Sweating, Heat Intolerance and Thyroid Problems. Hematology Not Present- Anemia, Easy Bruising, Petechiae and Prolonged Bleeding.  Vitals (April Garrison; 02-29-16 12:24 PM) Feb 29, 2016 12:13 PM Weight: 151.06 lb Height: 65in Body Surface Area: 1.76 m Body Mass Index: 25.14 kg/m  Pulse: 48 (Regular)  P.OX: 97% (Room air) BP: 122/64 (Sitting, Left Arm, Standard)       Physical Exam Monique Page, MD; Feb 29, 2016 6:32 PM) General Mental Status-Alert. General  Appearance-Cooperative and Appears stated age. Build & Nutrition-Well nourished and Moderately built.  Head and Neck Thyroid Gland Characteristics - normal size and consistency and no palpable nodules.  Chest and Lung Exam Chest and lung exam reveals -quiet, even and easy respiratory effort with no use of accessory muscles, non-tender and on auscultation, normal breath sounds, no adventitious sounds.  Cardiovascular Cardiovascular examination reveals -normal heart sounds, regular rate and rhythm with no murmurs, carotid auscultation reveals no bruits, abdominal aorta auscultation reveals no bruits and no prominent pulsation, femoral artery auscultation bilaterally reveals normal pulses, no bruits, no thrills, normal pedal pulses bilaterally and no digital clubbing, cyanosis, edema, increased warmth or tenderness.  Abdomen Palpation/Percussion Normal exam - Non Tender and No hepatosplenomegaly.  Neurologic Neurologic evaluation reveals -alert and oriented x 3 with no impairment of recent or remote memory. Motor-Grossly intact without any focal deficits.  Musculoskeletal Global Assessment Left Lower Extremity - no deformities, masses or tenderness, no known fractures. Right Lower Extremity - no deformities, masses or tenderness, no known fractures.  Collected: 02/20/2016 1:46 PM Lipoprotein (a) 345 nmol/L <75 nmol/L Collected: 02/20/2016 1:46 PM APOLIPOPROTEIN (60454) Collected: 02/20/2016 1:46 PM Apolipoprotein A-1131 mg/dL 116-209 mg/dL Collected: 02/20/2016 1:46 PM Apolipoprotein B 101 mg/dL 54-133 mg/dL. Collected: 02/20/2016 1:46 PM. Apolipo. B/A-1 Ratio 0.8 ratio units 0.0-0.6 ratio units Collected: 02/20/2016 1:46 PM    Assessment & Plan Monique Page MD; 2016/02/29 6:32 PM)  Atherosclerosis of native coronary artery of native heart with angina pectoris (I25.119) Story: Acute MI 02/16/2016 S/P PCI to LAD and D1 with the 2.75 x 23 mm Xience DES  and 2.5 x 15 mm Xience DES respectively. Residual Proximal RCA 80% stenosis. Normal LVEF. Needs PCI. Impression: EKG 02/20/2016:  Normal sinus rhythm at rate of 49 bpm, normal axis, IVCD, LVH. No evidence of ischemia. Normal QT interval. Future Plans 02/28/2016: CBC & PLATELETS (AUTO) [phone]) - one time 02/28/2016: PT (PROTHROMBIN TIME) (29562) - one time [id number]: METABOLIC PANEL, BASIC (99991111) - one time  History of MI (myocardial infarction) (I25.2) Story: 02/16/2016 in [place], [place] and transferred to [facility] for PTCA.  History of PTCA (Z98.61) Story: Acute MI 02/16/2016 S/P PCI to LAD and D1. Residual Proximal RCA 80% stenosis. Needs PCI  Labwork Story: 02/20/2016: Lp(a) 345, ApoA normal at 131, ApoB normal at 101, ApoB/A ratio minimally elevated at 0.8  01/06/2015: Total cholesterol 166, triglycerides 59, HDL 62, LDL 92, TSH 1.88, CBC normal, creatinine 0.96, CMP normal  Elevated Lp(a) (E78.89) Current Plans Started Niacin ER (Antihyperlipidemic) 500MG , 1 (one) Tablet every evening after dinner 30 minutes after aspirin, #30, 02/27/2016, No Refill.  Family history of early CAD (Z33.49) Story: Brother had CAD at age 84 years. Multiple second-degree relatives with premature CAD.   Current Plans Mechanism of underlying disease process and action of medications discussed with the patient. I discussed primary/secondary prevention and also dietary counceling was done. I have reviewed the results of the recently performed labs, markedly elevated LPA may explain her presentation with CAD. I'll start her on niacin and explain the side effect of the same. I'll try to uptitrate up to 2 g daily. Eventually will reduce the dose of statin. Due to ongoing symptoms of angina pectoris, high-grade lesion in the dominant RCA in the proximal segment, we'll proceed with coronary angiography and possible angioplasty. I will relook at the left system. I have reviewed her previous study performed angiography. All  questions answered.  Schedule for cardiac catheterization, and possible angioplasty. We discussed regarding risks, benefits, alternatives to this including stress testing, CTA and continued medical therapy. Patient wants to proceed. Understands <1-2% risk of death, stroke, MI, urgent CABG, bleeding, infection, renal failure but not limited to these.  CC: [name], NP-C  Addendum Note(Bridgette Ebony Hail AGNP-C; 03/04/2016 9:55 AM) 03/01/2016: CBC normal, creatinine 0.99, potassium 4.6, BMP normal, PT/INR normal  Labs stable to proceed with coronary angiography.  Signed by Monique Page, MD (02/27/2016 6:33 PM)

## 2016-03-08 ENCOUNTER — Encounter (HOSPITAL_COMMUNITY)
Admission: RE | Disposition: A | Discharge status: Home / Self Care | Payer: Self-pay | Source: Ambulatory Visit | Attending: Cardiology

## 2016-03-08 ENCOUNTER — Encounter (HOSPITAL_COMMUNITY): Payer: Self-pay | Admitting: *Deleted

## 2016-03-08 ENCOUNTER — Ambulatory Visit (HOSPITAL_COMMUNITY)
Admission: RE | Admit: 2016-03-08 | Discharge: 2016-03-08 | Disposition: A | Discharge status: Home / Self Care | Payer: BC Managed Care – PPO | Source: Ambulatory Visit | Attending: Cardiology | Admitting: Cardiology

## 2016-03-08 DIAGNOSIS — Z79899 Other long term (current) drug therapy: Secondary | ICD-10-CM | POA: Diagnosis not present

## 2016-03-08 DIAGNOSIS — I209 Angina pectoris, unspecified: Secondary | ICD-10-CM | POA: Diagnosis present

## 2016-03-08 DIAGNOSIS — E785 Hyperlipidemia, unspecified: Secondary | ICD-10-CM | POA: Diagnosis not present

## 2016-03-08 DIAGNOSIS — I25119 Atherosclerotic heart disease of native coronary artery with unspecified angina pectoris: Secondary | ICD-10-CM | POA: Diagnosis not present

## 2016-03-08 DIAGNOSIS — Z7982 Long term (current) use of aspirin: Secondary | ICD-10-CM | POA: Diagnosis not present

## 2016-03-08 DIAGNOSIS — Z955 Presence of coronary angioplasty implant and graft: Secondary | ICD-10-CM | POA: Diagnosis not present

## 2016-03-08 DIAGNOSIS — I252 Old myocardial infarction: Secondary | ICD-10-CM | POA: Insufficient documentation

## 2016-03-08 DIAGNOSIS — I2510 Atherosclerotic heart disease of native coronary artery without angina pectoris: Secondary | ICD-10-CM | POA: Diagnosis present

## 2016-03-08 DIAGNOSIS — Z8249 Family history of ischemic heart disease and other diseases of the circulatory system: Secondary | ICD-10-CM | POA: Insufficient documentation

## 2016-03-08 DIAGNOSIS — F329 Major depressive disorder, single episode, unspecified: Secondary | ICD-10-CM | POA: Insufficient documentation

## 2016-03-08 DIAGNOSIS — F419 Anxiety disorder, unspecified: Secondary | ICD-10-CM | POA: Insufficient documentation

## 2016-03-08 DIAGNOSIS — Z9861 Coronary angioplasty status: Secondary | ICD-10-CM

## 2016-03-08 HISTORY — DX: Non-ST elevation (NSTEMI) myocardial infarction: I21.4

## 2016-03-08 HISTORY — PX: CORONARY ANGIOGRAM: SHX5786

## 2016-03-08 HISTORY — DX: Atherosclerotic heart disease of native coronary artery without angina pectoris: I25.10

## 2016-03-08 HISTORY — PX: CARDIAC CATHETERIZATION: SHX172

## 2016-03-08 LAB — POCT ACTIVATED CLOTTING TIME: Activated Clotting Time: 265 seconds

## 2016-03-08 SURGERY — LEFT HEART CATH AND CORONARY ANGIOGRAPHY

## 2016-03-08 MED ORDER — SODIUM CHLORIDE 0.9 % IV SOLN
250.0000 mg | INTRAVENOUS | Status: DC | PRN
  Administered 2016-03-08: 1.75 mg/kg/h via INTRAVENOUS

## 2016-03-08 MED ORDER — LIDOCAINE HCL (PF) 1 % IJ SOLN
INTRAMUSCULAR | Status: AC
  Filled 2016-03-08: qty 30

## 2016-03-08 MED ORDER — ACETAMINOPHEN 325 MG PO TABS: 650.0000 mg | ORAL_TABLET | ORAL | Status: DC | PRN

## 2016-03-08 MED ORDER — DULOXETINE HCL 60 MG PO CPEP
120.0000 mg | ORAL_CAPSULE | Freq: Every day | ORAL | Status: DC
  Filled 2016-03-08: qty 2

## 2016-03-08 MED ORDER — TICAGRELOR 90 MG PO TABS: 90.0000 mg | ORAL_TABLET | Freq: Two times a day (BID) | ORAL | Status: DC

## 2016-03-08 MED ORDER — SODIUM CHLORIDE 0.9% FLUSH: 3.0000 mL | Freq: Two times a day (BID) | INTRAVENOUS | Status: DC

## 2016-03-08 MED ORDER — NITROGLYCERIN 1 MG/10 ML FOR IR/CATH LAB
INTRA_ARTERIAL | Status: AC
  Filled 2016-03-08: qty 10

## 2016-03-08 MED ORDER — ONDANSETRON HCL 4 MG/2ML IJ SOLN: 4.0000 mg | Freq: Four times a day (QID) | INTRAMUSCULAR | Status: DC | PRN

## 2016-03-08 MED ORDER — HEPARIN SODIUM (PORCINE) 1000 UNIT/ML IJ SOLN
INTRAMUSCULAR | Status: AC
  Filled 2016-03-08: qty 1

## 2016-03-08 MED ORDER — ASPIRIN EC 81 MG PO TBEC: 81.0000 mg | DELAYED_RELEASE_TABLET | Freq: Two times a day (BID) | ORAL | Status: DC

## 2016-03-08 MED ORDER — MIDAZOLAM HCL 2 MG/2ML IJ SOLN
INTRAMUSCULAR | Status: AC
  Filled 2016-03-08: qty 2

## 2016-03-08 MED ORDER — HEPARIN (PORCINE) IN NACL 2-0.9 UNIT/ML-% IJ SOLN
INTRAMUSCULAR | Status: DC | PRN
  Administered 2016-03-08: 1000 mL

## 2016-03-08 MED ORDER — ASPIRIN 81 MG PO CHEW: 81.0000 mg | CHEWABLE_TABLET | ORAL | Status: DC

## 2016-03-08 MED ORDER — SODIUM CHLORIDE 0.9 % IV SOLN: 250.0000 mL | INTRAVENOUS | Status: DC | PRN

## 2016-03-08 MED ORDER — NITROGLYCERIN 0.4 MG SL SUBL: 0.4000 mg | SUBLINGUAL_TABLET | SUBLINGUAL | Status: DC | PRN

## 2016-03-08 MED ORDER — VERAPAMIL HCL 2.5 MG/ML IV SOLN
INTRAVENOUS | Status: AC
  Filled 2016-03-08: qty 2

## 2016-03-08 MED ORDER — HYDROMORPHONE HCL 1 MG/ML IJ SOLN
INTRAMUSCULAR | Status: AC
  Filled 2016-03-08: qty 1

## 2016-03-08 MED ORDER — IOPAMIDOL (ISOVUE-370) INJECTION 76%
INTRAVENOUS | Status: AC
  Filled 2016-03-08: qty 100

## 2016-03-08 MED ORDER — HYDROMORPHONE HCL 1 MG/ML IJ SOLN
INTRAMUSCULAR | Status: DC | PRN
  Administered 2016-03-08: 1 mg via INTRAVENOUS

## 2016-03-08 MED ORDER — BIVALIRUDIN BOLUS VIA INFUSION - CUPID
INTRAVENOUS | Status: DC | PRN
  Administered 2016-03-08: 49.35 mg via INTRAVENOUS

## 2016-03-08 MED ORDER — BIVALIRUDIN 250 MG IV SOLR
INTRAVENOUS | Status: AC
  Filled 2016-03-08: qty 250

## 2016-03-08 MED ORDER — METOPROLOL TARTRATE 25 MG PO TABS: 25.0000 mg | ORAL_TABLET | Freq: Two times a day (BID) | ORAL | Status: DC

## 2016-03-08 MED ORDER — HEPARIN (PORCINE) IN NACL 2-0.9 UNIT/ML-% IJ SOLN
INTRAMUSCULAR | Status: AC
  Filled 2016-03-08: qty 1000

## 2016-03-08 MED ORDER — SODIUM CHLORIDE 0.9% FLUSH: 3.0000 mL | INTRAVENOUS | Status: DC | PRN

## 2016-03-08 MED ORDER — ROSUVASTATIN CALCIUM 20 MG PO TABS
20.0000 mg | ORAL_TABLET | Freq: Every day | ORAL | Status: DC
  Filled 2016-03-08: qty 1

## 2016-03-08 MED ORDER — HEPARIN SODIUM (PORCINE) 1000 UNIT/ML IJ SOLN
INTRAMUSCULAR | Status: DC | PRN
  Administered 2016-03-08: 7000 [IU] via INTRAVENOUS

## 2016-03-08 MED ORDER — LORATADINE 10 MG PO TABS
10.0000 mg | ORAL_TABLET | Freq: Every day | ORAL | Status: DC
  Filled 2016-03-08: qty 1

## 2016-03-08 MED ORDER — ALPRAZOLAM 0.5 MG PO TABS: 1.0000 mg | ORAL_TABLET | Freq: Every day | ORAL | Status: DC

## 2016-03-08 MED ORDER — VERAPAMIL HCL 2.5 MG/ML IV SOLN
INTRA_ARTERIAL | Status: DC | PRN
  Administered 2016-03-08: 7 mL via INTRA_ARTERIAL

## 2016-03-08 MED ORDER — SODIUM CHLORIDE 0.9 % WEIGHT BASED INFUSION
3.0000 mL/kg/h | INTRAVENOUS | Status: AC
  Administered 2016-03-08: 3 mL/kg/h via INTRAVENOUS

## 2016-03-08 MED ORDER — MIDAZOLAM HCL 2 MG/2ML IJ SOLN
INTRAMUSCULAR | Status: DC | PRN
  Administered 2016-03-08: 1 mg via INTRAVENOUS

## 2016-03-08 MED ORDER — SODIUM CHLORIDE 0.9 % WEIGHT BASED INFUSION
3.0000 mL/kg/h | INTRAVENOUS | Status: DC
  Administered 2016-03-08: 3 mL/kg/h via INTRAVENOUS

## 2016-03-08 MED ORDER — IOPAMIDOL (ISOVUE-370) INJECTION 76%
INTRAVENOUS | Status: DC | PRN
  Administered 2016-03-08: 90 mL

## 2016-03-08 MED ORDER — LIDOCAINE HCL (PF) 1 % IJ SOLN
INTRAMUSCULAR | Status: DC | PRN
  Administered 2016-03-08: 2 mL via SUBCUTANEOUS

## 2016-03-08 MED ORDER — NIACIN ER (ANTIHYPERLIPIDEMIC) 500 MG PO TBCR
500.0000 mg | EXTENDED_RELEASE_TABLET | Freq: Every evening | ORAL | Status: DC
  Filled 2016-03-08: qty 1

## 2016-03-08 MED ORDER — OXYBUTYNIN CHLORIDE ER 5 MG PO TB24
5.0000 mg | ORAL_TABLET | Freq: Every day | ORAL | Status: DC
  Filled 2016-03-08: qty 1

## 2016-03-08 MED ORDER — NITROGLYCERIN 1 MG/10 ML FOR IR/CATH LAB
INTRA_ARTERIAL | Status: DC | PRN
  Administered 2016-03-08: 200 ug via INTRACORONARY

## 2016-03-08 MED ORDER — SODIUM CHLORIDE 0.9 % WEIGHT BASED INFUSION: 1.0000 mL/kg/h | INTRAVENOUS | Status: DC

## 2016-03-08 SURGICAL SUPPLY — 16 items
BALLN ANGIOSCULPT RX 3.0X10 (BALLOONS) ×3
BALLOON ANGIOSCULPT RX 3.0X10 (BALLOONS) ×1 IMPLANT
CATH OPTICROSS 40MHZ (CATHETERS) ×3 IMPLANT
CATH OPTITORQUE TIG 4.0 5F (CATHETERS) ×3 IMPLANT
CATH VISTA GUIDE 6FR JR4 (CATHETERS) ×3 IMPLANT
DEVICE RAD COMP TR BAND LRG (VASCULAR PRODUCTS) ×3 IMPLANT
GLIDESHEATH SLEND A-KIT 6F 20G (SHEATH) ×3 IMPLANT
KIT ENCORE 26 ADVANTAGE (KITS) ×3 IMPLANT
KIT HEART LEFT (KITS) ×3 IMPLANT
PACK CARDIAC CATHETERIZATION (CUSTOM PROCEDURE TRAY) ×3 IMPLANT
SLED PULL BACK IVUS (MISCELLANEOUS) ×3 IMPLANT
STENT RESOLUTE INTEG 3.5X15 (Permanent Stent) ×3 IMPLANT
TRANSDUCER W/STOPCOCK (MISCELLANEOUS) ×3 IMPLANT
TUBING CIL FLEX 10 FLL-RA (TUBING) ×3 IMPLANT
WIRE COUGAR XT STRL 190CM (WIRE) ×3 IMPLANT
WIRE SAFE-T 1.5MM-J .035X260CM (WIRE) ×3 IMPLANT

## 2016-03-08 NOTE — Discharge Summary (Signed)
Physician Discharge Summary  Patient ID: Chlorene Anders MRN: [id number] DOB/AGE: 12/20/1950 65 y.o.  Admit date: 03/08/2016 Discharge date: 03/08/2016  Primary Discharge Diagnosis Angina Pectoris CAD native vessel:  Coronary angio 03/08/16: Mid LAD stent, 2.75 x 23 mm Xience and mid diagonal 1 DES, 2.5 x 15 mm Xience placed on 02/15/2016 during NSTEMI widely patent. Very large dominant RCA with proximal  85-90%  stenting with 3.5 x 15 mm resolute. 90% stenosis reduced to 0%.   Secondary Discharge Diagnosis Hyperlipidemia with markedly elevated LPA F/H of premature CAD H/O NSTEMI 02/14/2016.  Significant Diagnostic Studies: Coronary angiogram 03/08/2016: 1. Normal LV systolic function, EF 0000000, no wall motion of the normality. 2. Mid LAD stent, 2.75 x 23 mm Xience and mid diagonal 1 DES, 2.5 x 15 mm Xience placed on 02/15/2016 during NSTEMI widely patent. 2. Very large dominant RCA with proximal 60-70% stenosis by angiogram, but IVUS 85-90% with severe concentric soft plaque burden. S/P scoring balloon angioplasty with a 3.0 x 10 mm balloon at 16 atmospheric pressure,followed by stenting with 3.5 x 15 mm resolute. 90% stenosis reduced to 0%. Post stenting IVUS revealing excellent lumen gain with 0% residual stenosis. 3. Patient will be discharged home today with outpatient follow-up. Patient is already on aggressive medical therapy. A total of 90 mL contrast utilized.  Hospital Course: The patient is a 65 year old female who presents for a follow-up for Coronary artery disease. Patient previously very active and exercising on a regular basis, who started noticing chest tightness and right arm pain with activity and eventually presented on 02/15/2016 to the emergency room on the [facility] [place]. Transferred to [facility] and was diagnosed with NSTEMI and underwent angioplasty to the occluded diagonal-1 and mid LAD with the 2.75 x 23 mm Xience DES and 2.5 x 15  mm Xience DES respectively.Has residual stenosis in the secondary branch of the diagonal which was left alone and a 80% proximal RCA stenosis. Hence patient was brought to the cardiac catheterization lab to reevaluate her coronary anatomy due to ongoing chest discomfort with radiation to her arm even with minimal activity. She has class III angina pectoris. She was already aggressively treated with medical therapy, could not use a second agent for antianginal medication due to headache and also borderline low blood pressure.  Coronary regular.  Done earlier this morning revealed borderline proximal RCA stenosis,hence underwent IVUS  Which revealed high-grade 85-90% stenosis for which she underwent successful angioplasty and stenting with a DES 3.5 x 15 mm resolute stent.  She did well, I'm elated in the hallway, no recurrence of chest pain.  I discussed discharge with the patient the same day of the procedure, she has  Good social structure, husband is present at home, hence and safely be discharged.  She will continue with aspirin along with Brilinta and rested the medications as usual and we will continue to titrate have niacin to the maximum tolerated dose for LPA elevation.   Recommendations on discharge: Cardiac rehab as OP  Discharge Exam: Blood pressure 111/35, pulse 60, temperature 97.9 F (36.6 C), temperature source Oral, resp. rate 17, height 5\' 5"  (1.651 m), weight 65.772 kg (145 lb), SpO2 89 %.   General appearance: alert, cooperative, appears stated age and no distress Resp: clear to auscultation bilaterally Cardio: regular rate and rhythm, S1, S2 normal, no murmur, click, rub or gallop GI: soft, non-tender; bowel sounds normal; no masses,  no organomegaly Extremities: extremities  normal, atraumatic, no cyanosis or edema Pulses: 2+ and symmetric right radial access without hematoma and good pulse present (per RN). Neurologic: Grossly normal Labs:   Lab Results  Component Value  Date   WBC 6.9 01/06/2015   HGB 13.5 01/06/2015   HCT 40.5 01/06/2015   MCV 85.3 01/06/2015   PLT 265.0 01/06/2015   No results for input(s): NA, K, CL, CO2, BUN, CREATININE, CALCIUM, PROT, BILITOT, ALKPHOS, ALT, AST, GLUCOSE in the last 168 hours.  Invalid input(s): LABALBU  Lipid Panel     Component Value Date/Time   CHOL 166 01/06/2015 1141   TRIG 59.0 01/06/2015 1141   HDL 62.50 01/06/2015 1141   CHOLHDL 3 01/06/2015 1141   VLDL 11.8 01/06/2015 1141   LDLCALC 92 01/06/2015 1141    BNP (last 3 results) No results for input(s): BNP in the last 8760 hours.  HEMOGLOBIN A1C No results found for: HGBA1C, MPG  Cardiac Panel (last 3 results) No results for input(s): CKTOTAL, CKMB, TROPONINI, RELINDX in the last 8760 hours.  No results found for: CKTOTAL, CKMB, CKMBINDEX, TROPONINI   TSH No results for input(s): TSH in the last 8760 hours.  EKG: 03/08/2016: Normal sinus rhythm, normal axis.  No evidence of ischemia, normal EKG.   FOLLOW UP PLANS AND APPOINTMENTS Discharge Instructions    Amb Referral to Cardiac Rehabilitation    Complete by:  As directed   Diagnosis:  Coronary Stents Comment - had NSTEMI in [facility] 02/15/16            Medication List    TAKE these medications        ALPRAZolam 1 MG tablet  Commonly known as:  XANAX  Take 1 mg by mouth at bedtime.     amphetamine-dextroamphetamine 15 MG tablet  Commonly known as:  ADDERALL  Take 15 mg by mouth 2 (two) times daily. 1 tablet 2-3 times daily     aspirin 81 MG tablet  Take 81 mg by mouth 2 (two) times daily.     BRILINTA 90 MG Tabs tablet  Generic drug:  ticagrelor  Take 1 tablet by mouth 2 (two) times daily.     CYMBALTA 60 MG capsule  Generic drug:  DULoxetine  Take 120 mg by mouth daily.     fexofenadine 180 MG tablet  Commonly known as:  ALLEGRA  Take 180 mg by mouth daily as needed for allergies.     metoprolol tartrate 25 MG tablet  Commonly known as:  LOPRESSOR  Take 1 tablet by mouth  2 (two) times daily.     niacin 500 MG CR tablet  Commonly known as:  NIASPAN  Take 1 tablet by mouth every evening.     nitroGLYCERIN 0.4 MG SL tablet  Commonly known as:  NITROSTAT  Place 0.4 mg under the tongue every 5 (five) minutes as needed for chest pain.     rosuvastatin 20 MG tablet  Commonly known as:  CRESTOR  Take 20 mg by mouth daily.     tolterodine 1 MG tablet  Commonly known as:  DETROL  Take 2 tablets (2 mg total) by mouth 2 (two) times daily.           Follow-up Information    Follow up with Adrian Prows, MD.   Specialty:  Cardiology   Why:  Keep previous appointment   Contact information:   [address] [place] [address] [phone]      Adrian Prows, MD 03/08/2016, Ester Rink  PM  Pager: [phone] Office: [phone] If no answer: [phone]

## 2016-03-08 NOTE — Progress Notes (Signed)
TR BAND REMOVAL  LOCATION:  right radial  DEFLATED PER PROTOCOL:  Yes.    TIME BAND OFF / DRESSING APPLIED:   1400   SITE UPON ARRIVAL:   Level 0  SITE AFTER BAND REMOVAL:  Level 0  CIRCULATION SENSATION AND MOVEMENT:  Within Normal Limits  Yes.    COMMENTS:    

## 2016-03-08 NOTE — Interval H&P Note (Signed)
History and Physical Interval Note:  03/08/2016 6:23 AM  Monique Miller  has presented today for surgery, with the diagnosis of hb  The various methods of treatment have been discussed with the patient and family. After consideration of risks, benefits and other options for treatment, the patient has consented to  Procedure(s): Left Heart Cath and Coronary Angiography (N/A) and possible PCI  as a surgical intervention .  The patient's history has been reviewed, patient examined, no change in status, stable for surgery.  I have reviewed the patient's chart and labs.  Questions were answered to the patient's satisfaction.   Ischemic Symptoms? CCS III (Marked limitation of ordinary activity) Anti-ischemic Medical Therapy? Minimal Therapy (1 class of medications) Non-invasive Test Results? No non-invasive testing performed Prior CABG? No Previous CABG   Patient Information:   1-2V CAD, no prox LAD  A (7)  Indication: 20; Score: 7   Patient Information:   1-2V-CAD with DS 50-60% With No FFR, No IVUS  I (3)  Indication: 21; Score: 3   Patient Information:   1-2V-CAD with DS 50-60% With FFR  A (7)  Indication: 22; Score: 7   Patient Information:   1-2V-CAD with DS 50-60% With FFR>0.8, IVUS not significant  I (2)  Indication: 23; Score: 2   Patient Information:   3V-CAD without LMCA With Abnormal LV systolic function  A (9)  Indication: 48; Score: 9   Patient Information:   LMCA-CAD  A (9)  Indication: 49; Score: 9   Patient Information:   2V-CAD with prox LAD PCI  A (7)  Indication: 62; Score: 7   Patient Information:   2V-CAD with prox LAD CABG  A (8)  Indication: 62; Score: 8   Patient Information:   3V-CAD without LMCA With Low CAD burden(i.e., 3 focal stenoses, low SYNTAX score) PCI  A (7)  Indication: 63; Score: 7   Patient Information:   3V-CAD without LMCA With Low CAD burden(i.e., 3 focal stenoses, low SYNTAX score) CABG  A (9)   Indication: 63; Score: 9   Patient Information:   3V-CAD without LMCA E06c - Intermediate-high CAD burden (i.e., multiple diffuse lesions, presence of CTO, or high SYNTAX score) PCI  U (4)  Indication: 64; Score: 4   Patient Information:   3V-CAD without LMCA E06c - Intermediate-high CAD burden (i.e., multiple diffuse lesions, presence of CTO, or high SYNTAX score) CABG  A (9)  Indication: 64; Score: 9   Patient Information:   LMCA-CAD With Isolated LMCA stenosis  PCI  U (6)  Indication: 65; Score: 6   Patient Information:   LMCA-CAD With Isolated LMCA stenosis  CABG  A (9)  Indication: 65; Score: 9   Patient Information:   LMCA-CAD Additional CAD, low CAD burden (i.e., 1- to 2-vessel additional involvement, low SYNTAX score) PCI  U (5)  Indication: 66; Score: 5   Patient Information:   LMCA-CAD Additional CAD, low CAD burden (i.e., 1- to 2-vessel additional involvement, low SYNTAX score) CABG  A (9)  Indication: 66; Score: 9   Patient Information:   LMCA-CAD Additional CAD, intermediate-high CAD burden (i.e., 3-vessel involvement, presence of CTO, or high SYNTAX score) PCI  I (3)  Indication: 67; Score: 3   Patient Information:   LMCA-CAD Additional CAD, intermediate-high CAD burden (i.e., 3-vessel involvement, presence of CTO, or high SYNTAX score) CABG  A (9)  Indication: 67; Score: 9  Elham Fini

## 2016-03-08 NOTE — Progress Notes (Signed)
[id number] Came to see pt in case going home later today. Education completed with pt who voiced understanding. Stressed importance of brilinta with stents. Reviewed NTG use, ex ed, heart healthy diet and CRP 2. Referring to [place] Phase 2. Will let staff walk pt later when bedrest up. Graylon Good RN BSN 03/08/2016 2:03 PM

## 2016-03-14 ENCOUNTER — Ambulatory Visit (INDEPENDENT_AMBULATORY_CARE_PROVIDER_SITE_OTHER): Payer: BC Managed Care – PPO | Admitting: Adult Health

## 2016-03-14 ENCOUNTER — Encounter: Payer: Self-pay | Admitting: Adult Health

## 2016-03-14 VITALS — BP 126/62 | Temp 98.2°F | Ht 65.0 in | Wt 150.5 lb

## 2016-03-14 DIAGNOSIS — Z7189 Other specified counseling: Secondary | ICD-10-CM

## 2016-03-14 DIAGNOSIS — F988 Other specified behavioral and emotional disorders with onset usually occurring in childhood and adolescence: Secondary | ICD-10-CM

## 2016-03-14 DIAGNOSIS — F329 Major depressive disorder, single episode, unspecified: Secondary | ICD-10-CM | POA: Diagnosis not present

## 2016-03-14 DIAGNOSIS — Z7689 Persons encountering health services in other specified circumstances: Secondary | ICD-10-CM

## 2016-03-14 DIAGNOSIS — F909 Attention-deficit hyperactivity disorder, unspecified type: Secondary | ICD-10-CM | POA: Diagnosis not present

## 2016-03-14 DIAGNOSIS — F32A Depression, unspecified: Secondary | ICD-10-CM

## 2016-03-14 DIAGNOSIS — M8580 Other specified disorders of bone density and structure, unspecified site: Secondary | ICD-10-CM | POA: Insufficient documentation

## 2016-03-14 NOTE — Patient Instructions (Signed)
It was great meeting you today!  Please follow up with me for your complete physical.   If you need anything in the meantime, please let me know

## 2016-03-14 NOTE — Progress Notes (Signed)
Patient presents to clinic today to establish care. She is a pleasant caucasian female who  has a past medical history of Depression; Allergy; migraines; Thyroid disease; Grover's disease; Anxiety; Restless leg syndrome; Pneumonia; Arthritis; Cancer ([place]); Anemia; Constipation by delayed colonic transit; Coronary artery disease; and NSTEMI (non-ST elevated myocardial infarction) ([place]) (02/2016).  Her last physical was in 02/25.2016 with NP Justin Mend.   Acute Concerns: Establish Care   Chronic Issues: Depression  - Is controlled with current medication and she follows up with psych.   - NSTEMI/ CAD - She presented to an ER on 02/15/2016 while  the [facility] for chest tightness. and was diagnosed with NSTEMI and underwent angioplasty to the occluded diagonal-1 and mid LAD with the 2.75 x 23 mm Xience DES and 2.5 x 15 mm Xience DES respectively.  She had an additional coronary angio on 03/08/16: . Very large dominant RCA with proximal 85-90% stenting with 3.5 x 15 mm resolute. 90% stenosis reduced to 0%.   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    Health Maintenance: Dental --Twice a year  Vision -- yearly  Immunizations -- UTD Colonoscopy -- 2014 ( does not have records) Mammogram -- 10/2005 PAP -- 2016 - Normal. No abnormal in the past Bone Density -- 2012 - osteopenia  Diet: Has been working on a healthy diet Exercise: Stays active.   Is followed by:  - Cardiology - Dr. Einar Gip-  - Letta Moynahan   Past Medical History  Diagnosis Date  . Depression     see [facility] psychotherapist and Trey Paula  . Allergy   . Hx of migraines   . Thyroid disease     hx of hypothyroid  . Grover's disease   . Anxiety   . Restless leg syndrome   . Pneumonia     walking pneumonia  . Arthritis   . Cancer ([place])     skin cancer (basal cell)  . Anemia     "many years ago"  . Constipation by delayed colonic transit   . Coronary artery disease   . NSTEMI  (non-ST elevated myocardial infarction) ([place]) 02/2016    Past Surgical History  Procedure Laterality Date  . Cesarean section      x2  . Appendectomy    . Breast surgery      augmentation  . Colonoscopy    . Cardiac catheterization N/A 03/08/2016    Procedure: Left Heart Cath and Coronary Angiography;  Surgeon: Adrian Prows, MD;  Location: [facility];  Service: Cardiovascular;  Laterality: N/A;  . Cardiac catheterization N/A 03/08/2016    Procedure: Coronary Stent Intervention;  Surgeon: Adrian Prows, MD;  Location: [facility];  Service: Cardiovascular;  Laterality: N/A;  Resolute 3.5x15  . Cardiac catheterization N/A 03/08/2016    Procedure: Intravascular Ultrasound/IVUS;  Surgeon: Adrian Prows, MD;  Location: [facility];  Service: Cardiovascular;  Laterality: N/A;  RCA MID  . Coronary angiogram  03/08/2016  . Back surgery      L4-L5    Current Outpatient Prescriptions on File Prior to Visit  Medication Sig Dispense Refill  . ALPRAZolam (XANAX) 1 MG tablet Take 1 mg by mouth at bedtime.     Marland Kitchen aspirin 81 MG tablet Take 81 mg by mouth 2 (two) times daily.    Marland Kitchen BRILINTA 90 MG TABS tablet Take 1 tablet by mouth 2 (two) times daily.    . CYMBALTA 60  MG capsule Take 120 mg by mouth daily.     . fexofenadine (ALLEGRA) 180 MG tablet Take 180 mg by mouth daily as needed for allergies.     . metoprolol tartrate (LOPRESSOR) 25 MG tablet Take 1 tablet by mouth 2 (two) times daily.    . niacin (NIASPAN) 500 MG CR tablet Take 1 tablet by mouth every evening.    . nitroGLYCERIN (NITROSTAT) 0.4 MG SL tablet Place 0.4 mg under the tongue every 5 (five) minutes as needed for chest pain.     . rosuvastatin (CRESTOR) 20 MG tablet Take 20 mg by mouth daily.    Marland Kitchen amphetamine-dextroamphetamine (ADDERALL) 15 MG tablet Take 15 mg by mouth 2 (two) times daily. Reported on 03/14/2016    . tolterodine (DETROL) 1 MG tablet Take 2 tablets (2 mg total) by mouth 2 (two) times daily. (Patient not taking:  Reported on 03/14/2016) 180 tablet 0   No current facility-administered medications on file prior to visit.    No Known Allergies  Family History  Problem Relation Age of Onset  . Cancer Mother     pancreatic  . Cancer Maternal Uncle     pancreatic  . Cancer Maternal Grandmother     pancreatic    Social History   Social History  . Marital Status: Married    Spouse Name: N/A  . Number of Children: N/A  . Years of Education: N/A   Occupational History  . Not on file.   Social History Main Topics  . Smoking status: Never Smoker   . Smokeless tobacco: Never Used  . Alcohol Use: 4.2 oz/week    7 Glasses of wine per week     Comment: occassionally  . Drug Use: No  . Sexual Activity: Not on file   Other Topics Concern  . 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        Review of Systems  Constitutional: Negative.   Respiratory: Negative.   Cardiovascular: Negative.   Gastrointestinal: Negative.   Neurological: Negative.   Psychiatric/Behavioral: Negative.   All other systems reviewed and are negative.   BP 126/62 mmHg  Temp(Src) 98.2 F (36.8 C) (Oral)  Ht 5\' 5"  (1.651 m)  Wt 150 lb 8 oz (68.266 kg)  BMI 25.04 kg/m2  Physical Exam  Constitutional: She is oriented to person, place, and time and well-developed, well-nourished, and in no distress. No distress.  Cardiovascular: Normal rate, regular rhythm, normal heart sounds and intact distal pulses.  Exam reveals no gallop and no friction rub.   No murmur heard. Pulmonary/Chest: Effort normal and breath sounds normal. No respiratory distress. She has no wheezes. She has no rales. She exhibits no tenderness.  Neurological: She is alert and oriented to person, place, and time. Gait normal. GCS  score is 15.  Skin: Skin is warm and dry. No rash noted. She is not diaphoretic. No erythema. No pallor.  Psychiatric: Mood, memory, affect and judgment normal.  Nursing note and vitals reviewed.   Recent Results (from the past 2160 hour(s))  POCT Activated clotting time     Status: None   Collection Time: 03/08/16  8:09 AM  Result Value Ref Range   Activated Clotting Time 265 seconds    Assessment/Plan: 1. Encounter to establish care - Follow up for CPE - Follow up sooner for any acute issues - Continue to stay active and eat healthy   2. Osteopenia - DG Bone Density; Future - Add Vitamin D ( 800 units) and Calcium (1200 mg)  to diet 3. Attention deficit disorder - Advised that if she does not need to take Adderall than she should not.  - Follow up with psych   4. Depression - Controlled on current medication.  - Follow up with psych as needed   [name], NP

## 2016-03-23 ENCOUNTER — Telehealth (HOSPITAL_COMMUNITY): Payer: Self-pay | Admitting: *Deleted

## 2016-03-27 ENCOUNTER — Other Ambulatory Visit (INDEPENDENT_AMBULATORY_CARE_PROVIDER_SITE_OTHER): Payer: BC Managed Care – PPO

## 2016-03-27 DIAGNOSIS — Z0000 Encounter for general adult medical examination without abnormal findings: Secondary | ICD-10-CM

## 2016-03-27 LAB — CBC WITH DIFFERENTIAL/PLATELET
BASOS ABS: 0.1 10*3/uL (ref 0.0–0.1)
BASOS PCT: 1.7 % (ref 0.0–3.0)
EOS ABS: 0.3 10*3/uL (ref 0.0–0.7)
Eosinophils Relative: 4.2 % (ref 0.0–5.0)
HEMATOCRIT: 40.7 % (ref 36.0–46.0)
HEMOGLOBIN: 13.7 g/dL (ref 12.0–15.0)
LYMPHS PCT: 34 % (ref 12.0–46.0)
Lymphs Abs: 2.1 10*3/uL (ref 0.7–4.0)
MCHC: 33.7 g/dL (ref 30.0–36.0)
MCV: 87.2 fl (ref 78.0–100.0)
MONO ABS: 0.4 10*3/uL (ref 0.1–1.0)
Monocytes Relative: 6.9 % (ref 3.0–12.0)
Neutro Abs: 3.2 10*3/uL (ref 1.4–7.7)
Neutrophils Relative %: 53.2 % (ref 43.0–77.0)
Platelets: 239 10*3/uL (ref 150.0–400.0)
RBC: 4.66 Mil/uL (ref 3.87–5.11)
RDW: 14.2 % (ref 11.5–15.5)
WBC: 6.1 10*3/uL (ref 4.0–10.5)

## 2016-03-27 LAB — TSH: TSH: 3.59 u[IU]/mL (ref 0.35–4.50)

## 2016-03-27 LAB — HEPATIC FUNCTION PANEL
ALK PHOS: 46 U/L (ref 39–117)
ALT: 23 U/L (ref 0–35)
AST: 23 U/L (ref 0–37)
Albumin: 4 g/dL (ref 3.5–5.2)
BILIRUBIN DIRECT: 0.1 mg/dL (ref 0.0–0.3)
BILIRUBIN TOTAL: 0.5 mg/dL (ref 0.2–1.2)
TOTAL PROTEIN: 6 g/dL (ref 6.0–8.3)

## 2016-03-27 LAB — BASIC METABOLIC PANEL
BUN: 20 mg/dL (ref 6–23)
CALCIUM: 8.9 mg/dL (ref 8.4–10.5)
CHLORIDE: 102 meq/L (ref 96–112)
CO2: 27 mEq/L (ref 19–32)
CREATININE: 1.11 mg/dL (ref 0.40–1.20)
GFR: 52.85 mL/min — AB (ref >60.00)
Glucose, Bld: 120 mg/dL — ABNORMAL HIGH (ref 70–99)
Potassium: 4 mEq/L (ref 3.5–5.1)
Sodium: 138 mEq/L (ref 135–145)

## 2016-03-27 LAB — LIPID PANEL
CHOLESTEROL: 109 mg/dL (ref 0–200)
HDL: 51.5 mg/dL (ref >39.00)
LDL CALC: 47 mg/dL (ref 0–99)
NONHDL: 57.93
Total CHOL/HDL Ratio: 2
Triglycerides: 53 mg/dL (ref 0.0–149.0)
VLDL: 10.6 mg/dL (ref 0.0–40.0)

## 2016-03-27 LAB — POC URINALSYSI DIPSTICK (AUTOMATED)
Bilirubin, UA: NEGATIVE
Blood, UA: NEGATIVE
GLUCOSE UA: NEGATIVE
Nitrite, UA: NEGATIVE
PROTEIN UA: NEGATIVE
SPEC GRAV UA: 1.015
Urobilinogen, UA: 1
pH, UA: 6.5

## 2016-04-04 ENCOUNTER — Encounter: Payer: Self-pay | Admitting: Adult Health

## 2016-04-04 ENCOUNTER — Ambulatory Visit (INDEPENDENT_AMBULATORY_CARE_PROVIDER_SITE_OTHER): Payer: BC Managed Care – PPO | Admitting: Adult Health

## 2016-04-04 VITALS — BP 128/64 | Temp 98.1°F | Ht 65.0 in | Wt 152.2 lb

## 2016-04-04 DIAGNOSIS — F909 Attention-deficit hyperactivity disorder, unspecified type: Secondary | ICD-10-CM | POA: Diagnosis not present

## 2016-04-04 DIAGNOSIS — Z780 Asymptomatic menopausal state: Secondary | ICD-10-CM | POA: Diagnosis not present

## 2016-04-04 DIAGNOSIS — Z0000 Encounter for general adult medical examination without abnormal findings: Secondary | ICD-10-CM

## 2016-04-04 DIAGNOSIS — F329 Major depressive disorder, single episode, unspecified: Secondary | ICD-10-CM | POA: Diagnosis not present

## 2016-04-04 DIAGNOSIS — F988 Other specified behavioral and emotional disorders with onset usually occurring in childhood and adolescence: Secondary | ICD-10-CM

## 2016-04-04 DIAGNOSIS — F32A Depression, unspecified: Secondary | ICD-10-CM

## 2016-04-04 NOTE — Patient Instructions (Addendum)
It was great seeing you again today!  Call and shcedule your bone scan   Talk to the insurance company about the shingles vaccination and pneumonia vaccination  Follow up with me in one year or sooner if needed. Please let me know if you need anything  I hope you enjoy the summer and get out on the paddle board!  Health Maintenance, Female Adopting a healthy lifestyle and getting preventive care can go a long way to promote health and wellness. Talk with your health care provider about what schedule of regular examinations is right for you. This is a good chance for you to check in with your provider about disease prevention and staying healthy. In between checkups, there are plenty of things you can do on your own. Experts have done a lot of research about which lifestyle changes and preventive measures are most likely to keep you healthy. Ask your health care provider for more information. WEIGHT AND DIET  Eat a healthy diet  Be sure to include plenty of vegetables, fruits, low-fat dairy products, and lean protein.  Do not eat a lot of foods high in solid fats, added sugars, or salt.  Get regular exercise. This is one of the most important things you can do for your health.  Most adults should exercise for at least 150 minutes each week. The exercise should increase your heart rate and make you sweat (moderate-intensity exercise).  Most adults should also do strengthening exercises at least twice a week. This is in addition to the moderate-intensity exercise.  Maintain a healthy weight  Body mass index (BMI) is a measurement that can be used to identify possible weight problems. It estimates body fat based on height and weight. Your health care provider can help determine your BMI and help you achieve or maintain a healthy weight.  For females 70 years of age and older:   A BMI below 18.5 is considered underweight.  A BMI of 18.5 to 24.9 is normal.  A BMI of 25 to 29.9 is  considered overweight.  A BMI of 30 and above is considered obese.  Watch levels of cholesterol and blood lipids  You should start having your blood tested for lipids and cholesterol at 63 years of age, then have this test every 5 years.  You may need to have your cholesterol levels checked more often if:  Your lipid or cholesterol levels are high.  You are older than 63 years of age.  You are at high risk for heart disease.  CANCER SCREENING   Lung Cancer  Lung cancer screening is recommended for adults 70-104 years old who are at high risk for lung cancer because of a history of smoking.  A yearly low-dose CT scan of the lungs is recommended for people who:  Currently smoke.  Have quit within the past 15 years.  Have at least a 30-pack-year history of smoking. A pack year is smoking an average of one pack of cigarettes a day for 1 year.  Yearly screening should continue until it has been 15 years since you quit.  Yearly screening should stop if you develop a health problem that would prevent you from having lung cancer treatment.  Breast Cancer  Practice breast self-awareness. This means understanding how your breasts normally appear and feel.  It also means doing regular breast self-exams. Let your health care provider know about any changes, no matter how small.  If you are in your 20s or 30s, you should have  a clinical breast exam (CBE) by a health care provider every 1-3 years as part of a regular health exam.  If you are 51 or older, have a CBE every year. Also consider having a breast X-ray (mammogram) every year.  If you have a family history of breast cancer, talk to your health care provider about genetic screening.  If you are at high risk for breast cancer, talk to your health care provider about having an MRI and a mammogram every year.  Breast cancer gene (BRCA) assessment is recommended for women who have family members with BRCA-related cancers.  BRCA-related cancers include:  Breast.  Ovarian.  Tubal.  Peritoneal cancers.  Results of the assessment will determine the need for genetic counseling and BRCA1 and BRCA2 testing. Cervical Cancer Your health care provider may recommend that you be screened regularly for cancer of the pelvic organs (ovaries, uterus, and vagina). This screening involves a pelvic examination, including checking for microscopic changes to the surface of your cervix (Pap test). You may be encouraged to have this screening done every 3 years, beginning at age 31.  For women ages 32-65, health care providers may recommend pelvic exams and Pap testing every 3 years, or they may recommend the Pap and pelvic exam, combined with testing for human papilloma virus (HPV), every 5 years. Some types of HPV increase your risk of cervical cancer. Testing for HPV may also be done on women of any age with unclear Pap test results.  Other health care providers may not recommend any screening for nonpregnant women who are considered low risk for pelvic cancer and who do not have symptoms. Ask your health care provider if a screening pelvic exam is right for you.  If you have had past treatment for cervical cancer or a condition that could lead to cancer, you need Pap tests and screening for cancer for at least 20 years after your treatment. If Pap tests have been discontinued, your risk factors (such as having a new sexual partner) need to be reassessed to determine if screening should resume. Some women have medical problems that increase the chance of getting cervical cancer. In these cases, your health care provider may recommend more frequent screening and Pap tests. Colorectal Cancer  This type of cancer can be detected and often prevented.  Routine colorectal cancer screening usually begins at 63 years of age and continues through 63 years of age.  Your health care provider may recommend screening at an earlier age if you  have risk factors for colon cancer.  Your health care provider may also recommend using home test kits to check for hidden blood in the stool.  A small camera at the end of a tube can be used to examine your colon directly (sigmoidoscopy or colonoscopy). This is done to check for the earliest forms of colorectal cancer.  Routine screening usually begins at age 2.  Direct examination of the colon should be repeated every 5-10 years through 63 years of age. However, you may need to be screened more often if early forms of precancerous polyps or small growths are found. Skin Cancer  Check your skin from head to toe regularly.  Tell your health care provider about any new moles or changes in moles, especially if there is a change in a mole's shape or color.  Also tell your health care provider if you have a mole that is larger than the size of a pencil eraser.  Always use sunscreen. Apply sunscreen  liberally and repeatedly throughout the day.  Protect yourself by wearing long sleeves, pants, a wide-brimmed hat, and sunglasses whenever you are outside. HEART DISEASE, DIABETES, AND HIGH BLOOD PRESSURE   High blood pressure causes heart disease and increases the risk of stroke. High blood pressure is more likely to develop in:  People who have blood pressure in the high end of the normal range (130-139/85-89 mm Hg).  People who are overweight or obese.  People who are African American.  If you are 46-69 years of age, have your blood pressure checked every 3-5 years. If you are 27 years of age or older, have your blood pressure checked every year. You should have your blood pressure measured twice--once when you are at a hospital or clinic, and once when you are not at a hospital or clinic. Record the average of the two measurements. To check your blood pressure when you are not at a hospital or clinic, you can use:  An automated blood pressure machine at a pharmacy.  A home blood pressure  monitor.  If you are between 83 years and 23 years old, ask your health care provider if you should take aspirin to prevent strokes.  Have regular diabetes screenings. This involves taking a blood sample to check your fasting blood sugar level.  If you are at a normal weight and have a low risk for diabetes, have this test once every three years after 63 years of age.  If you are overweight and have a high risk for diabetes, consider being tested at a younger age or more often. PREVENTING INFECTION  Hepatitis B  If you have a higher risk for hepatitis B, you should be screened for this virus. You are considered at high risk for hepatitis B if:  You were born in a country where hepatitis B is common. Ask your health care provider which countries are considered high risk.  Your parents were born in a high-risk country, and you have not been immunized against hepatitis B (hepatitis B vaccine).  You have HIV or AIDS.  You use needles to inject street drugs.  You live with someone who has hepatitis B.  You have had sex with someone who has hepatitis B.  You get hemodialysis treatment.  You take certain medicines for conditions, including cancer, organ transplantation, and autoimmune conditions. Hepatitis C  Blood testing is recommended for:  Everyone born from [birth year] through 1963.  Anyone with known risk factors for hepatitis C. Sexually transmitted infections (STIs)  You should be screened for sexually transmitted infections (STIs) including gonorrhea and chlamydia if:  You are sexually active and are younger than 63 years of age.  You are older than 63 years of age and your health care provider tells you that you are at risk for this type of infection.  Your sexual activity has changed since you were last screened and you are at an increased risk for chlamydia or gonorrhea. Ask your health care provider if you are at risk.  If you do not have HIV, but are at risk, it may be  recommended that you take a prescription medicine daily to prevent HIV infection. This is called pre-exposure prophylaxis (PrEP). You are considered at risk if:  You are sexually active and do not regularly use condoms or know the HIV status of your partner(s).  You take drugs by injection.  You are sexually active with a partner who has HIV. Talk with your health care provider about whether you  are at high risk of being infected with HIV. If you choose to begin PrEP, you should first be tested for HIV. You should then be tested every 3 months for as long as you are taking PrEP.  PREGNANCY   If you are premenopausal and you may become pregnant, ask your health care provider about preconception counseling.  If you may become pregnant, take 400 to 800 micrograms (mcg) of folic acid every day.  If you want to prevent pregnancy, talk to your health care provider about birth control (contraception). OSTEOPOROSIS AND MENOPAUSE   Osteoporosis is a disease in which the bones lose minerals and strength with aging. This can result in serious bone fractures. Your risk for osteoporosis can be identified using a bone density scan.  If you are 16 years of age or older, or if you are at risk for osteoporosis and fractures, ask your health care provider if you should be screened.  Ask your health care provider whether you should take a calcium or vitamin D supplement to lower your risk for osteoporosis.  Menopause may have certain physical symptoms and risks.  Hormone replacement therapy may reduce some of these symptoms and risks. Talk to your health care provider about whether hormone replacement therapy is right for you.  HOME CARE INSTRUCTIONS   Schedule regular health, dental, and eye exams.  Stay current with your immunizations.   Do not use any tobacco products including cigarettes, chewing tobacco, or electronic cigarettes.  If you are pregnant, do not drink alcohol.  If you are  breastfeeding, limit how much and how often you drink alcohol.  Limit alcohol intake to no more than 1 drink per day for nonpregnant women. One drink equals 12 ounces of beer, 5 ounces of wine, or 1 ounces of hard liquor.  Do not use street drugs.  Do not share needles.  Ask your health care provider for help if you need support or information about quitting drugs.  Tell your health care provider if you often feel depressed.  Tell your health care provider if you have ever been abused or do not feel safe at home.   This information is not intended to replace advice given to you by your health care provider. Make sure you discuss any questions you have with your health care provider.   Document Released: 05/14/2011 Document Revised: 11/19/2014 Document Reviewed: 09/30/2013 Elsevier Interactive Patient Education [employer].

## 2016-04-04 NOTE — Progress Notes (Signed)
Subjective:    Patient ID: Monique Miller, female    DOB: 02/13/1951, 65 y.o.   MRN: [id number]  HPI  Patient presents for yearly preventative medicine examination.She is a pleasant 65 year old female  has a past medical history of Depression; Allergy; migraines; Thyroid disease; Grover's disease; Anxiety; Restless leg syndrome; Pneumonia; Arthritis; Cancer ([place]); Anemia; Constipation by delayed colonic transit; Coronary artery disease; and NSTEMI (non-ST elevated myocardial infarction) ([place]) (02/2016).   All immunizations and health maintenance protocols were reviewed with the patient and needed orders were placed.  Medication reconciliation,  past medical history, social history, problem list and allergies were reviewed in detail with the patient  Goals were established with regard to weight loss, exercise, and  diet in compliance with medications  End of life planning was discussed- she has an advanced directive or living will.   She has had a mammogram in the last year. She has also seen a dentist and an eye doctor this year. She needs to have her bone density scan done. She [place] up to day on her pelvic exam.   She has recently bought a paddle board. She is eating healthy   Follows up with Cardiology this summer  Review of Systems  Constitutional: Negative.   Respiratory: Negative.   Cardiovascular: Negative.   Gastrointestinal: Negative.   Endocrine: Negative.   Genitourinary: Negative.   Musculoskeletal: Negative.   Skin: Negative.   Allergic/Immunologic: Negative.   Neurological: Negative.   Hematological: Negative.   Psychiatric/Behavioral: Negative.   All other systems reviewed and are negative.  Past Medical History  Diagnosis Date  . Depression     see [facility] psychotherapist and Trey Paula  . Allergy   . Hx of migraines   . Thyroid disease     hx of hypothyroid  . Grover's disease   . Anxiety   . Restless leg syndrome   . Pneumonia     walking pneumonia  .  Arthritis   . Cancer ([place])     skin cancer (basal cell)  . Anemia     "many years ago"  . Constipation by delayed colonic transit   . Coronary artery disease   . NSTEMI (non-ST elevated myocardial infarction) ([place]) 02/2016    Social History   Social History  . Marital Status: Married    Spouse Name: N/A  . Number of Children: N/A  . Years of Education: N/A   Occupational History  . Not on file.   Social History Main Topics  . Smoking status: Never Smoker   . Smokeless tobacco: Never Used  . Alcohol Use: 4.2 oz/week    7 Glasses of wine per week     Comment: occassionally  . Drug Use: No  . Sexual Activity: Not on file   Other Topics Concern  . 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        Past Surgical History  Procedure Laterality Date  . Cesarean section      x2  . Appendectomy    . Breast surgery      augmentation  . Colonoscopy    . Cardiac catheterization N/A 03/08/2016  Procedure: Left Heart Cath and Coronary Angiography;  Surgeon: Adrian Prows, MD;  Location: [facility];  Service: Cardiovascular;  Laterality: N/A;  . Cardiac catheterization N/A 03/08/2016    Procedure: Coronary Stent Intervention;  Surgeon: Adrian Prows, MD;  Location: [facility];  Service: Cardiovascular;  Laterality: N/A;  Resolute 3.5x15  . Cardiac catheterization N/A 03/08/2016    Procedure: Intravascular Ultrasound/IVUS;  Surgeon: Adrian Prows, MD;  Location: [facility];  Service: Cardiovascular;  Laterality: N/A;  RCA MID  . Coronary angiogram  03/08/2016  . Back surgery      L4-L5    Family History  Problem Relation Age of Onset  . Cancer Mother     pancreatic  . Cancer Maternal Uncle     pancreatic  . Cancer Maternal Grandmother      pancreatic    No Known Allergies  Current Outpatient Prescriptions on File Prior to Visit  Medication Sig Dispense Refill  . ALPRAZolam (XANAX) 1 MG tablet Take 1 mg by mouth at bedtime.     Marland Kitchen aspirin 81 MG tablet Take 81 mg by mouth 2 (two) times daily.    Marland Kitchen BRILINTA 90 MG TABS tablet Take 1 tablet by mouth 2 (two) times daily.    . CYMBALTA 60 MG capsule Take 120 mg by mouth daily.     . fexofenadine (ALLEGRA) 180 MG tablet Take 180 mg by mouth daily as needed for allergies.     . metoprolol tartrate (LOPRESSOR) 25 MG tablet Take 1 tablet by mouth 2 (two) times daily.    . niacin (NIASPAN) 500 MG CR tablet Take 2 tablets by mouth every evening.     . nitroGLYCERIN (NITROSTAT) 0.4 MG SL tablet Place 0.4 mg under the tongue every 5 (five) minutes as needed for chest pain.     . rosuvastatin (CRESTOR) 20 MG tablet Take 20 mg by mouth daily.    Marland Kitchen tolterodine (DETROL) 1 MG tablet Take 2 tablets (2 mg total) by mouth 2 (two) times daily. 180 tablet 0  . amphetamine-dextroamphetamine (ADDERALL) 15 MG tablet Take 15 mg by mouth 2 (two) times daily. Reported on 04/04/2016     No current facility-administered medications on file prior to visit.    BP 128/64 mmHg  Temp(Src) 98.1 F (36.7 C) (Oral)  Ht 5\' 5"  (1.651 m)  Wt 152 lb 3.2 oz (69.037 kg)  BMI 25.33 kg/m2       Objective:   Physical Exam  Constitutional: She is oriented to person, place, and time. She appears well-developed and well-nourished. No distress.  HENT:  Head: Normocephalic and atraumatic.  Right Ear: External ear normal.  Left Ear: External ear normal.  Nose: Nose normal.  Mouth/Throat: Oropharynx is clear and moist. No oropharyngeal exudate.  Eyes: Conjunctivae and EOM are normal. Pupils are equal, round, and reactive to light. Right eye exhibits no discharge. Left eye exhibits no discharge. No scleral icterus.  Neck: Normal range of motion. Neck supple. No tracheal deviation present. No thyromegaly present.    Cardiovascular: Normal rate, regular rhythm, normal heart sounds and intact distal pulses.  Exam reveals no gallop and no friction rub.   No murmur heard. Pulmonary/Chest: Effort normal and breath sounds normal. No respiratory distress. She has no wheezes. She has no rales. She exhibits no tenderness.  Abdominal: Soft. Bowel sounds are normal. She exhibits no distension and no mass. There is no tenderness. There is no rebound and no guarding.  Musculoskeletal: Normal range  of motion. She exhibits no edema or tenderness.  Lymphadenopathy:    She has no cervical adenopathy.  Neurological: She is alert and oriented to person, place, and time. She has normal reflexes. No cranial nerve deficit. Coordination normal.  Skin: Skin is warm and dry. No rash noted. No erythema. No pallor.  Psychiatric: She has a normal mood and affect. Her behavior is normal. Judgment and thought content normal.  Nursing note and vitals reviewed.      Assessment & Plan:  1. Routine general medical examination at a health care facility - - Follow up in one year for your next physical - Follow up sooner if needed - Continue to increase exercise per cardiology orders - eat healthy  - Reviewed labs in detail with patient and answered any questions she had  2. Attention deficit disorder - No longer taking Adderall  3. Depression - Controlled   4. Post-menopausal - DG Bone Density; Future  [name], NP

## 2016-04-12 ENCOUNTER — Encounter (HOSPITAL_COMMUNITY)
Admission: RE | Admit: 2016-04-12 | Discharge: 2016-04-12 | Disposition: A | Discharge status: Home / Self Care | Payer: BC Managed Care – PPO | Source: Ambulatory Visit | Attending: Cardiology | Admitting: Cardiology

## 2016-04-12 ENCOUNTER — Encounter (HOSPITAL_COMMUNITY): Payer: Self-pay

## 2016-04-12 VITALS — BP 122/62 | HR 65 | Ht 65.0 in | Wt 153.2 lb

## 2016-04-12 DIAGNOSIS — Z955 Presence of coronary angioplasty implant and graft: Secondary | ICD-10-CM | POA: Diagnosis present

## 2016-04-12 DIAGNOSIS — Z79899 Other long term (current) drug therapy: Secondary | ICD-10-CM | POA: Diagnosis not present

## 2016-04-12 NOTE — Progress Notes (Signed)
Cardiac Individual Treatment Plan  Patient Details  Name: Monique Miller MRN: [id number] Date of Birth: 09-28-1951 Referring Provider:        CARDIAC REHAB PHASE II ORIENTATION from 04/12/2016 in [facility]   Referring Provider  Kela Millin, MD      Initial Encounter Date:       CARDIAC REHAB PHASE II ORIENTATION from 04/12/2016 in [facility]   Date  04/12/16   Referring Provider  Kela Millin, MD      Visit Diagnosis: Stented coronary artery  Patient's Home Medications on Admission:  Current outpatient prescriptions:  .  ALPRAZolam (XANAX) 1 MG tablet, Take 1 mg by mouth at bedtime. , Disp: , Rfl:  .  aspirin 81 MG tablet, Take 81 mg by mouth once. , Disp: , Rfl:  .  BRILINTA 90 MG TABS tablet, Take 1 tablet by mouth 2 (two) times daily., Disp: , Rfl:  .  CYMBALTA 60 MG capsule, Take 120 mg by mouth daily. , Disp: , Rfl:  .  fexofenadine (ALLEGRA) 180 MG tablet, Take 180 mg by mouth daily as needed for allergies. , Disp: , Rfl:  .  metoprolol tartrate (LOPRESSOR) 25 MG tablet, Take 1 tablet by mouth 2 (two) times daily., Disp: , Rfl:  .  niacin (NIASPAN) 500 MG CR tablet, Take 2 tablets by mouth every evening. , Disp: , Rfl:  .  nitroGLYCERIN (NITROSTAT) 0.4 MG SL tablet, Place 0.4 mg under the tongue every 5 (five) minutes as needed for chest pain. , Disp: , Rfl:  .  rosuvastatin (CRESTOR) 20 MG tablet, Take 20 mg by mouth daily., Disp: , Rfl:  .  tolterodine (DETROL) 1 MG tablet, Take 2 tablets (2 mg total) by mouth 2 (two) times daily., Disp: 180 tablet, Rfl: 0 .  isosorbide mononitrate (IMDUR) 60 MG 24 hr tablet, 60 mg. Reported on 04/12/2016, Disp: , Rfl:  .  pravastatin (PRAVACHOL) 40 MG tablet, 40 mg. Reported on 04/12/2016, Disp: , Rfl:   Past Medical History: Past Medical History  Diagnosis Date  . Depression     see [facility] psychotherapist and Trey Paula  . Allergy   . Hx of migraines   . Thyroid disease    hx of hypothyroid  . Grover's disease   . Anxiety   . Restless leg syndrome   . Pneumonia     walking pneumonia  . Arthritis   . Cancer ([place])     skin cancer (basal cell)  . Anemia     "many years ago"  . Constipation by delayed colonic transit   . Coronary artery disease   . NSTEMI (non-ST elevated myocardial infarction) ([place]) 02/2016    Tobacco Use: History  Smoking status  . Never Smoker   Smokeless tobacco  . Never Used    Labs: Recent Review Flowsheet Data    Labs for ITP Cardiac and Pulmonary Rehab Latest Ref Rng 11/09/2011 10/12/2013 01/06/2015 03/27/2016   Cholestrol 0 - 200 mg/dL 204(H) 189 166 109   LDLCALC 0 - 99 mg/dL - 112(H) 92 47   LDLDIRECT - 115.5 - - -   HDL >39.00 mg/dL 68.70 54.90 62.50 51.50   Trlycerides 0.0 - 149.0 mg/dL 111.0 111.0 59.0 53.0      Capillary Blood Glucose: No results found for: GLUCAP   Exercise Target Goals: Date: 04/12/16  Exercise Program Goal: Individual exercise prescription set with THRR, safety & activity barriers. Participant demonstrates ability to  understand and report RPE using BORG scale, to self-measure pulse accurately, and to acknowledge the importance of the exercise prescription.  Exercise Prescription Goal: Starting with aerobic activity 30 plus minutes a day, 3 days per week for initial exercise prescription. Provide home exercise prescription and guidelines that participant acknowledges understanding prior to discharge.  Activity Barriers & Risk Stratification:     Activity Barriers & Cardiac Risk Stratification - 04/12/16 0831    Activity Barriers & Cardiac Risk Stratification   Activity Barriers Back Problems  rod in L4-L5 and surgery in 2015   Cardiac Risk Stratification High      6 Minute Walk:     6 Minute Walk      04/12/16 1125       6 Minute Walk   Phase Initial     Distance 1872 feet     Walk Time 6 minutes     # of Rest Breaks 0     MPH 3.5     METS 4.3     RPE 13     Perceived  Dyspnea  0     VO2 Peak 15.2     Symptoms No     Resting HR 65 bpm     Resting BP 122/62 mmHg     Max Ex. HR 89 bpm     Max Ex. BP 136/82 mmHg     2 Minute Post BP 102/72 mmHg        Initial Exercise Prescription:     Initial Exercise Prescription - 04/12/16 1100    Date of Initial Exercise RX and Referring Provider   Date 04/12/16   Referring Provider Kela Millin, MD   Treadmill   MPH 3   Grade 2   Minutes 10   METs 3.3   Bike   Level 1   Minutes 10   METs 2.7   NuStep   Level 3   Minutes 10   METs 2   Prescription Details   Frequency (times per week) 3   Duration Progress to 30 minutes of continuous aerobic without signs/symptoms of physical distress   Intensity   THRR 40-80% of Max Heartrate 63-126   Ratings of Perceived Exertion 11-13   Perceived Dyspnea 0-4   Progression   Progression Continue to progress workloads to maintain intensity without signs/symptoms of physical distress.   Resistance Training   Training Prescription Yes   Weight 3   Reps 10-12      Perform Capillary Blood Glucose checks as needed.  Exercise Prescription Changes:   Exercise Comments:   Discharge Exercise Prescription (Final Exercise Prescription Changes):   Nutrition:  Target Goals: Understanding of nutrition guidelines, daily intake of sodium 1500mg , cholesterol 200mg , calories 30% from fat and 7% or less from saturated fats, daily to have 5 or more servings of fruits and vegetables.  Biometrics:     Pre Biometrics - 04/12/16 1113    Pre Biometrics   Waist Circumference 31 inches   Hip Circumference 41 inches   Waist to Hip Ratio 0.76 %   Triceps Skinfold 29 mm   % Body Fat 36 %   Grip Strength 29 kg   Flexibility 10.5 in   Single Leg Stand 16.21 seconds       Nutrition Therapy Plan and Nutrition Goals:   Nutrition Discharge: Nutrition Scores:   Nutrition Goals Re-Evaluation:   Psychosocial: Target Goals: Acknowledge presence or absence of  depression, maximize coping skills, provide positive support system. Participant is able  to verbalize types and ability to use techniques and skills needed for reducing stress and depression.  Initial Review & Psychosocial Screening:   Quality of Life Scores:     Quality of Life - 04/12/16 1115    Quality of Life Scores   Health/Function Pre 16.57 %   Socioeconomic Pre 24.17 %   Psych/Spiritual Pre 18.64 %   Family Pre 12 %   GLOBAL Pre 17.88 %      PHQ-9:     Recent Review Flowsheet Data    There is no flowsheet data to display.      Psychosocial Evaluation and Intervention:   Psychosocial Re-Evaluation:   Vocational Rehabilitation: Provide vocational rehab assistance to qualifying candidates.   Vocational Rehab Evaluation & Intervention:     Vocational Rehab - 04/12/16 1432    Initial Vocational Rehab Evaluation & Intervention   Assessment shows need for Vocational Rehabilitation No  pt feels she may return to work without any difficulty.      Education: Education Goals: Education classes will be provided on a weekly basis, covering required topics. Participant will state understanding/return demonstration of topics presented.  Learning Barriers/Preferences:     Learning Barriers/Preferences - 04/12/16 [id number]    Learning Barriers/Preferences   Learning Barriers Sight   Learning Preferences Skilled Demonstration      Education Topics: Count Your Pulse:  -Group instruction provided by verbal instruction, demonstration, patient participation and written materials to support subject.  Instructors address importance of being able to find your pulse and how to count your pulse when at home without a heart monitor.  Patients get hands on experience counting their pulse with staff help and individually.   Heart Attack, Angina, and Risk Factor Modification:  -Group instruction provided by verbal instruction, video, and written materials to support subject.   Instructors address signs and symptoms of angina and heart attacks.    Also discuss risk factors for heart disease and how to make changes to improve heart health risk factors.   Functional Fitness:  -Group instruction provided by verbal instruction, demonstration, patient participation, and written materials to support subject.  Instructors address safety measures for doing things around the house.  Discuss how to get up and down off the floor, how to pick things up properly, how to safely get out of a chair without assistance, and balance training.   Meditation and Mindfulness:  -Group instruction provided by verbal instruction, patient participation, and written materials to support subject.  Instructor addresses importance of mindfulness and meditation practice to help reduce stress and improve awareness.  Instructor also leads participants through a meditation exercise.    Stretching for Flexibility and Mobility:  -Group instruction provided by verbal instruction, patient participation, and written materials to support subject.  Instructors lead participants through series of stretches that are designed to increase flexibility thus improving mobility.  These stretches are additional exercise for major muscle groups that are typically performed during regular warm up and cool down.   Hands Only CPR Anytime:  -Group instruction provided by verbal instruction, video, patient participation and written materials to support subject.  Instructors co-teach with AHA video for hands only CPR.  Participants get hands on experience with mannequins.   Nutrition I class: Heart Healthy Eating:  -Group instruction provided by PowerPoint slides, verbal discussion, and written materials to support subject matter. The instructor gives an explanation and review of the Therapeutic Lifestyle Changes diet recommendations, which includes a discussion on lipid goals, dietary fat, sodium, fiber,  plant stanol/sterol  esters, sugar, and the components of a well-balanced, healthy diet.   Nutrition II class: Lifestyle Skills:  -Group instruction provided by PowerPoint slides, verbal discussion, and written materials to support subject matter. The instructor gives an explanation and review of label reading, grocery shopping for heart health, heart healthy recipe modifications, and ways to make healthier choices when eating out.   Diabetes Question & Answer:  -Group instruction provided by PowerPoint slides, verbal discussion, and written materials to support subject matter. The instructor gives an explanation and review of diabetes co-morbidities, pre- and post-prandial blood glucose goals, pre-exercise blood glucose goals, signs, symptoms, and treatment of hypoglycemia and hyperglycemia, and foot care basics.   Diabetes Blitz:  -Group instruction provided by PowerPoint slides, verbal discussion, and written materials to support subject matter. The instructor gives an explanation and review of the physiology behind type 1 and type 2 diabetes, diabetes medications and rational behind using different medications, pre- and post-prandial blood glucose recommendations and Hemoglobin A1c goals, diabetes diet, and exercise including blood glucose guidelines for exercising safely.    Portion Distortion:  -Group instruction provided by PowerPoint slides, verbal discussion, written materials, and food models to support subject matter. The instructor gives an explanation of serving size versus portion size, changes in portions sizes over the last 20 years, and what consists of a serving from each food group.   Stress Management:  -Group instruction provided by verbal instruction, video, and written materials to support subject matter.  Instructors review role of stress in heart disease and how to cope with stress positively.     Exercising on Your Own:  -Group instruction provided by verbal instruction, power point, and  written materials to support subject.  Instructors discuss benefits of exercise, components of exercise, frequency and intensity of exercise, and end points for exercise.  Also discuss use of nitroglycerin and activating EMS.  Review options of places to exercise outside of rehab.  Review guidelines for sex with heart disease.   Cardiac Drugs I:  -Group instruction provided by verbal instruction and written materials to support subject.  Instructor reviews cardiac drug classes: antiplatelets, anticoagulants, beta blockers, and statins.  Instructor discusses reasons, side effects, and lifestyle considerations for each drug class.   Cardiac Drugs II:  -Group instruction provided by verbal instruction and written materials to support subject.  Instructor reviews cardiac drug classes: angiotensin converting enzyme inhibitors (ACE-I), angiotensin II receptor blockers (ARBs), nitrates, and calcium channel blockers.  Instructor discusses reasons, side effects, and lifestyle considerations for each drug class.   Anatomy and Physiology of the Circulatory System:  -Group instruction provided by verbal instruction, video, and written materials to support subject.  Reviews functional anatomy of heart, how it relates to various diagnoses, and what role the heart plays in the overall system.   Knowledge Questionnaire Score:     Knowledge Questionnaire Score - 04/12/16 1041    Knowledge Questionnaire Score   Pre Score 23/24      Core Components/Risk Factors/Patient Goals at Admission:     Personal Goals and Risk Factors at Admission - 04/12/16 1149    Core Components/Risk Factors/Patient Goals on Admission   Lipids Yes   Intervention Provide education and support for participant on nutrition & aerobic/resistive exercise along with prescribed medications to achieve LDL 70mg , HDL >40mg .   Expected Outcomes Short Term: Participant states understanding of desired cholesterol values and is compliant with  medications prescribed. Participant is following exercise prescription and nutrition guidelines.;Long Term: Cholesterol  controlled with medications as prescribed, with individualized exercise RX and with personalized nutrition plan. Value goals: LDL < 70mg , HDL > 40 mg.      Core Components/Risk Factors/Patient Goals Review:    Core Components/Risk Factors/Patient Goals at Discharge (Final Review):    ITP Comments:     ITP Comments      04/12/16 0825           ITP Comments Dr. Loreta Ave, Medical Director          Comments:  Pt in today for cardiac rehab orientation from 0800-1000.  As a part of cardiac rehab orientation, pt completed a 6 minute walk test.  Pt tolerated well with no complaints of CP or SOB. Monitor showed SR with frequent PAC's.  Strips sent to Dr. Einar Gip office to review.  Cherre Huger, BSN

## 2016-04-12 NOTE — Progress Notes (Signed)
Cardiac Rehab Medication Review by a Pharmacist  Does the patient  feel that his/her medications are working for him/her?  yes  Has the patient been experiencing any side effects to the medications prescribed?  yes  Does the patient measure his/her own blood pressure or blood glucose at home?  no   Does the patient have any problems obtaining medications due to transportation or finances?   no  Understanding of regimen: good Understanding of indications: good Potential of compliance: good  Pharmacist comments: 65 y/o F presents to cardiac rehab in good spirits without assistance. Reviewed medication list and patient endorses compliance with medications. Pt complained of some bruising likely due to DAPT therapy. Counseled on the importance of these medications and instructed her to talk to her cardiologist if the issues worsen. Pt endorsed understanding of treatment plan.   Myrene Galas, PharmD 04/12/2016 8:52 AM

## 2016-04-13 ENCOUNTER — Telehealth (HOSPITAL_COMMUNITY): Payer: Self-pay | Admitting: *Deleted

## 2016-04-13 NOTE — Telephone Encounter (Signed)
Pt called to inform cardiac rehab staff that she can't commit to the cardiac rehab program.  She will be traveling to [place] a lot this summer to care for her 63 year old Mother in [employer]. Cherre Huger, BSN

## 2016-04-16 ENCOUNTER — Ambulatory Visit (HOSPITAL_COMMUNITY): Payer: BC Managed Care – PPO

## 2016-04-18 ENCOUNTER — Ambulatory Visit (HOSPITAL_COMMUNITY): Payer: BC Managed Care – PPO

## 2016-04-20 ENCOUNTER — Ambulatory Visit (HOSPITAL_COMMUNITY): Payer: BC Managed Care – PPO

## 2016-04-23 ENCOUNTER — Ambulatory Visit (HOSPITAL_COMMUNITY): Payer: BC Managed Care – PPO

## 2016-04-25 ENCOUNTER — Ambulatory Visit (HOSPITAL_COMMUNITY): Payer: BC Managed Care – PPO

## 2016-04-27 ENCOUNTER — Ambulatory Visit (HOSPITAL_COMMUNITY): Payer: BC Managed Care – PPO

## 2016-04-30 ENCOUNTER — Ambulatory Visit (HOSPITAL_COMMUNITY): Payer: BC Managed Care – PPO

## 2016-05-02 ENCOUNTER — Ambulatory Visit (HOSPITAL_COMMUNITY): Payer: BC Managed Care – PPO

## 2016-05-04 ENCOUNTER — Ambulatory Visit (HOSPITAL_COMMUNITY): Payer: BC Managed Care – PPO

## 2016-05-07 ENCOUNTER — Ambulatory Visit (HOSPITAL_COMMUNITY): Payer: BC Managed Care – PPO

## 2016-05-09 ENCOUNTER — Ambulatory Visit (HOSPITAL_COMMUNITY): Payer: BC Managed Care – PPO

## 2016-05-11 ENCOUNTER — Ambulatory Visit (HOSPITAL_COMMUNITY): Payer: BC Managed Care – PPO

## 2016-05-14 ENCOUNTER — Ambulatory Visit (HOSPITAL_COMMUNITY): Payer: BC Managed Care – PPO

## 2016-05-16 ENCOUNTER — Ambulatory Visit (HOSPITAL_COMMUNITY): Payer: BC Managed Care – PPO

## 2016-05-18 ENCOUNTER — Ambulatory Visit (HOSPITAL_COMMUNITY): Payer: BC Managed Care – PPO

## 2016-05-21 ENCOUNTER — Ambulatory Visit (HOSPITAL_COMMUNITY): Payer: BC Managed Care – PPO

## 2016-05-23 ENCOUNTER — Ambulatory Visit (HOSPITAL_COMMUNITY): Payer: BC Managed Care – PPO

## 2016-05-25 ENCOUNTER — Ambulatory Visit (HOSPITAL_COMMUNITY): Payer: BC Managed Care – PPO

## 2016-05-28 ENCOUNTER — Ambulatory Visit (HOSPITAL_COMMUNITY): Payer: BC Managed Care – PPO

## 2016-05-30 ENCOUNTER — Ambulatory Visit (HOSPITAL_COMMUNITY): Payer: BC Managed Care – PPO

## 2016-06-01 ENCOUNTER — Ambulatory Visit (HOSPITAL_COMMUNITY): Payer: BC Managed Care – PPO

## 2016-06-04 ENCOUNTER — Ambulatory Visit (HOSPITAL_COMMUNITY): Payer: BC Managed Care – PPO

## 2016-06-06 ENCOUNTER — Ambulatory Visit (HOSPITAL_COMMUNITY): Payer: BC Managed Care – PPO

## 2016-06-08 ENCOUNTER — Ambulatory Visit (HOSPITAL_COMMUNITY): Payer: BC Managed Care – PPO

## 2016-06-11 ENCOUNTER — Ambulatory Visit (HOSPITAL_COMMUNITY): Payer: BC Managed Care – PPO

## 2016-06-13 ENCOUNTER — Ambulatory Visit (HOSPITAL_COMMUNITY): Payer: BC Managed Care – PPO

## 2016-06-15 ENCOUNTER — Ambulatory Visit (HOSPITAL_COMMUNITY): Payer: BC Managed Care – PPO

## 2016-06-18 ENCOUNTER — Ambulatory Visit (HOSPITAL_COMMUNITY): Payer: BC Managed Care – PPO

## 2016-06-20 ENCOUNTER — Ambulatory Visit (HOSPITAL_COMMUNITY): Payer: BC Managed Care – PPO

## 2016-06-22 ENCOUNTER — Ambulatory Visit (HOSPITAL_COMMUNITY): Payer: BC Managed Care – PPO

## 2016-06-25 ENCOUNTER — Ambulatory Visit (HOSPITAL_COMMUNITY): Payer: BC Managed Care – PPO

## 2016-06-27 ENCOUNTER — Ambulatory Visit (HOSPITAL_COMMUNITY): Payer: BC Managed Care – PPO

## 2016-06-29 ENCOUNTER — Ambulatory Visit (HOSPITAL_COMMUNITY): Payer: BC Managed Care – PPO

## 2016-07-02 ENCOUNTER — Ambulatory Visit (HOSPITAL_COMMUNITY): Payer: BC Managed Care – PPO

## 2016-07-04 ENCOUNTER — Ambulatory Visit (HOSPITAL_COMMUNITY): Payer: BC Managed Care – PPO

## 2016-07-06 ENCOUNTER — Ambulatory Visit (HOSPITAL_COMMUNITY): Payer: BC Managed Care – PPO

## 2016-08-09 ENCOUNTER — Other Ambulatory Visit: Payer: Self-pay

## 2016-08-09 ENCOUNTER — Telehealth: Payer: Self-pay | Admitting: Adult Health

## 2016-08-09 MED ORDER — TOLTERODINE TARTRATE 1 MG PO TABS: 2.0000 mg | ORAL_TABLET | Freq: Two times a day (BID) | ORAL | 0 refills | Status: DC

## 2016-08-09 NOTE — Telephone Encounter (Signed)
Rx refilled.

## 2016-08-09 NOTE — Telephone Encounter (Signed)
OK to refill

## 2016-08-09 NOTE — Telephone Encounter (Signed)
Ok to refill 

## 2016-08-09 NOTE — Telephone Encounter (Signed)
Pt needs a refill on tolterodine #90 w/refills send to [facility]

## 2016-10-10 ENCOUNTER — Other Ambulatory Visit: Payer: Self-pay | Admitting: Adult Health

## 2016-10-10 DIAGNOSIS — Z1239 Encounter for other screening for malignant neoplasm of breast: Secondary | ICD-10-CM

## 2016-10-17 ENCOUNTER — Ambulatory Visit: Payer: BC Managed Care – PPO

## 2016-10-23 ENCOUNTER — Ambulatory Visit (INDEPENDENT_AMBULATORY_CARE_PROVIDER_SITE_OTHER): Payer: BC Managed Care – PPO

## 2016-10-23 DIAGNOSIS — Z1231 Encounter for screening mammogram for malignant neoplasm of breast: Secondary | ICD-10-CM

## 2016-10-23 DIAGNOSIS — Z1239 Encounter for other screening for malignant neoplasm of breast: Secondary | ICD-10-CM

## 2016-10-23 DIAGNOSIS — M85852 Other specified disorders of bone density and structure, left thigh: Secondary | ICD-10-CM | POA: Diagnosis not present

## 2016-10-23 DIAGNOSIS — Z780 Asymptomatic menopausal state: Secondary | ICD-10-CM

## 2016-10-24 ENCOUNTER — Telehealth: Payer: Self-pay | Admitting: Family Medicine

## 2016-10-24 NOTE — Telephone Encounter (Signed)
Left a message for the pt to return call.  Need to inform her of bone density results.

## 2016-11-21 ENCOUNTER — Encounter: Payer: Self-pay | Admitting: Adult Health

## 2016-11-21 ENCOUNTER — Ambulatory Visit (INDEPENDENT_AMBULATORY_CARE_PROVIDER_SITE_OTHER): Payer: BC Managed Care – PPO | Admitting: Adult Health

## 2016-11-21 VITALS — BP 124/68 | Temp 98.6°F | Ht 65.0 in | Wt 152.7 lb

## 2016-11-21 DIAGNOSIS — N3000 Acute cystitis without hematuria: Secondary | ICD-10-CM | POA: Diagnosis not present

## 2016-11-21 LAB — POCT URINALYSIS DIPSTICK
Bilirubin, UA: NEGATIVE
Blood, UA: NEGATIVE
Glucose, UA: NEGATIVE
KETONES UA: NEGATIVE
Spec Grav, UA: 1.025
Urobilinogen, UA: 1
pH, UA: 6

## 2016-11-21 MED ORDER — CIPROFLOXACIN HCL 500 MG PO TABS: 500.0000 mg | ORAL_TABLET | Freq: Two times a day (BID) | ORAL | 0 refills | Status: DC

## 2016-11-21 NOTE — Progress Notes (Signed)
[id number] Monique Kennard, NP No chief complaint on file.   Current Issues:  Presents with unknown amount of days of dysuria, urinary urgency and urinary frequency Associated symptoms include:  cloudy urine, urinary frequency, urinary incontinence and urinary urgency  There is a previous history of of similar symptoms. Sexually active:  Yes with female.   No concern for STI.  Prior to Admission medications   Medication Sig Start Date End Date Taking? Authorizing Provider  ALPRAZolam Duanne Moron) 1 MG tablet Take 1 mg by mouth at bedtime.  10/11/11  Yes Historical Provider, MD  aspirin 81 MG tablet Take 81 mg by mouth once.    Yes Historical Provider, MD  BRILINTA 90 MG TABS tablet Take 1 tablet by mouth 2 (two) times daily. 02/17/16  Yes Historical Provider, MD  CYMBALTA 60 MG capsule Take 120 mg by mouth daily.  11/03/11  Yes Historical Provider, MD  fexofenadine (ALLEGRA) 180 MG tablet Take 180 mg by mouth daily as needed for allergies.    Yes Historical Provider, MD  isosorbide mononitrate (IMDUR) 60 MG 24 hr tablet 60 mg. Reported on 04/12/2016 03/17/16  Yes Historical Provider, MD  metoprolol tartrate (LOPRESSOR) 25 MG tablet Take 1 tablet by mouth 2 (two) times daily. 02/17/16  Yes Historical Provider, MD  niacin (NIASPAN) 500 MG CR tablet Take 2 tablets by mouth every evening.  02/27/16  Yes Historical Provider, MD  nitroGLYCERIN (NITROSTAT) 0.4 MG SL tablet Place 0.4 mg under the tongue every 5 (five) minutes as needed for chest pain.  02/17/16  Yes Historical Provider, MD  pravastatin (PRAVACHOL) 40 MG tablet 40 mg. Reported on 04/12/2016 02/17/16  Yes Historical Provider, MD  rosuvastatin (CRESTOR) 20 MG tablet Take 20 mg by mouth daily.   Yes Historical Provider, MD  tolterodine (DETROL) 1 MG tablet Take 2 tablets (2 mg total) by mouth 2 (two) times daily. 08/09/16  Yes [name], NP    Review of Systems:Negative unless mentioned above   PE:  BP 124/68   Temp 98.6 F (37 C) (Oral)   Ht 5\' 5"  (1.651  m)   Wt 152 lb 11.2 oz (69.3 kg)   BMI 25.41 kg/m  Constitutional: Alert and oriented, does not appear in any acute distress Heart: Normal Rate and normal rhythm  Lungs: Clear to auscultation  Back: No CVA tenderness Abdomen: No tenderness, no masses, no distention   Results for orders placed or performed in visit on 11/21/16  POCT urinalysis dipstick  Result Value Ref Range   Color, UA Yellow    Clarity, UA Cloudy    Glucose, UA Neg    Bilirubin, UA Neg    Ketones, UA Neg    Spec Grav, UA 1.025    Blood, UA Neg    pH, UA 6.0    Protein, UA Trace    Urobilinogen, UA 1.0    Nitrite, UA Trace    Leukocytes, UA small (1+) (A) Negative    Assessment and Plan:    1. Acute cystitis without hematuria - POCT urinalysis dipstick + Leuks, Nitrates, Uro  - Urine culture - ciprofloxacin (CIPRO) 500 MG tablet; Take 1 tablet (500 mg total) by mouth 2 (two) times daily.  Dispense: 6 tablet; Refill: 0 - Follow upas needed  [name], NP

## 2016-11-23 LAB — URINE CULTURE

## 2017-01-09 ENCOUNTER — Other Ambulatory Visit: Payer: Self-pay | Admitting: Neurological Surgery

## 2017-01-09 DIAGNOSIS — M545 Low back pain: Secondary | ICD-10-CM

## 2017-01-21 ENCOUNTER — Ambulatory Visit
Admission: RE | Admit: 2017-01-21 | Discharge: 2017-01-21 | Disposition: A | Discharge status: Home / Self Care | Payer: BC Managed Care – PPO | Source: Ambulatory Visit | Attending: Neurological Surgery | Admitting: Neurological Surgery

## 2017-01-21 DIAGNOSIS — M545 Low back pain: Secondary | ICD-10-CM

## 2017-01-21 MED ORDER — DIAZEPAM 5 MG PO TABS
10.0000 mg | ORAL_TABLET | Freq: Once | ORAL | Status: AC
  Administered 2017-01-21: 10 mg via ORAL

## 2017-01-21 NOTE — Discharge Instructions (Signed)
Myelogram Discharge Instructions  1. Go home and rest quietly for the next 24 hours.  It is important to lie flat for the next 24 hours.  Get up only to go to the restroom.  You may lie in the bed or on a couch on your back, your stomach, your left side or your right side.  You may have one pillow under your head.  You may have pillows between your knees while you are on your side or under your knees while you are on your back.  2. DO NOT drive today.  Recline the seat as far back as it will go, while still wearing your seat belt, on the way home.  3. You may get up to go to the bathroom as needed.  You may sit up for 10 minutes to eat.  You may resume your normal diet and medications unless otherwise indicated.  Drink lots of extra fluids today and tomorrow.  4. The incidence of headache, nausea, or vomiting is about 5% (one in 20 patients).  If you develop a headache, lie flat and drink plenty of fluids until the headache goes away.  Caffeinated beverages may be helpful.  If you develop severe nausea and vomiting or a headache that does not go away with flat bed rest, call [phone].  5. You may resume normal activities after your 24 hours of bed rest is over; however, do not exert yourself strongly or do any heavy lifting tomorrow. If when you get up you have a headache when standing, go back to bed and force fluids for another 24 hours.  6. Call your physician for a follow-up appointment.  The results of your myelogram will be sent directly to your physician by the following day.  7. If you have any questions or if complications develop after you arrive home, please call [phone].  Discharge instructions have been explained to the patient.  The patient, or the person responsible for the patient, fully understands these instructions.       May resume Cymbalta on January 22, 2017, after 9:30 am.

## 2017-01-29 ENCOUNTER — Ambulatory Visit
Admission: RE | Admit: 2017-01-29 | Discharge: 2017-01-29 | Disposition: A | Discharge status: Home / Self Care | Payer: BC Managed Care – PPO | Source: Ambulatory Visit | Attending: Neurological Surgery | Admitting: Neurological Surgery

## 2017-01-29 VITALS — BP 118/56 | HR 56

## 2017-01-29 DIAGNOSIS — M48061 Spinal stenosis, lumbar region without neurogenic claudication: Secondary | ICD-10-CM

## 2017-01-29 MED ORDER — ONDANSETRON HCL 4 MG/2ML IJ SOLN: 4.0000 mg | Freq: Four times a day (QID) | INTRAMUSCULAR | Status: DC | PRN

## 2017-01-29 MED ORDER — IOPAMIDOL (ISOVUE-M 200) INJECTION 41%
15.0000 mL | Freq: Once | INTRAMUSCULAR | Status: AC
Start: 2017-01-29 — End: 2017-01-29
  Administered 2017-01-29: 15 mL via INTRATHECAL

## 2017-01-29 MED ORDER — DIAZEPAM 5 MG PO TABS
10.0000 mg | ORAL_TABLET | Freq: Once | ORAL | Status: AC
  Administered 2017-01-29: 10 mg via ORAL

## 2017-01-29 NOTE — Discharge Instructions (Signed)
Myelogram Discharge Instructions  1. Go home and rest quietly for the next 24 hours.  It is important to lie flat for the next 24 hours.  Get up only to go to the restroom.  You may lie in the bed or on a couch on your back, your stomach, your left side or your right side.  You may have one pillow under your head.  You may have pillows between your knees while you are on your side or under your knees while you are on your back.  2. DO NOT drive today.  Recline the seat as far back as it will go, while still wearing your seat belt, on the way home.  3. You may get up to go to the bathroom as needed.  You may sit up for 10 minutes to eat.  You may resume your normal diet and medications unless otherwise indicated.  Drink lots of extra fluids today and tomorrow.  4. The incidence of headache, nausea, or vomiting is about 5% (one in 20 patients).  If you develop a headache, lie flat and drink plenty of fluids until the headache goes away.  Caffeinated beverages may be helpful.  If you develop severe nausea and vomiting or a headache that does not go away with flat bed rest, call [phone].  5. You may resume normal activities after your 24 hours of bed rest is over; however, do not exert yourself strongly or do any heavy lifting tomorrow. If when you get up you have a headache when standing, go back to bed and force fluids for another 24 hours.  6. Call your physician for a follow-up appointment.  The results of your myelogram will be sent directly to your physician by the following day.  7. If you have any questions or if complications develop after you arrive home, please call [phone].  Discharge instructions have been explained to the patient.  The patient, or the person responsible for the patient, fully understands these instructions.      MAY RESUME BRILINTA TODAY.  May resume Cymbalta on January 30, 2017, after 11:30 am.

## 2017-01-29 NOTE — Progress Notes (Signed)
Patient states she has been off Brillinta and Cymbalta for at least the past two days.  Brita Romp, RN

## 2017-02-01 ENCOUNTER — Other Ambulatory Visit: Payer: Self-pay | Admitting: Neurological Surgery

## 2017-02-19 ENCOUNTER — Other Ambulatory Visit (HOSPITAL_COMMUNITY): Payer: BC Managed Care – PPO

## 2017-02-26 NOTE — Pre-Procedure Instructions (Addendum)
Ginevra Tacker  02/26/2017      [facility] - [place], [place] - [address] [place] [address] Phone: [phone] Fax: [phone]    Your procedure is scheduled on April 26   Report to [place] at 1 AM.  Call this number if you have problems the morning of surgery:  [phone]   Remember:  Do not eat food or drink liquids after midnight.  Take these medicines the morning of surgery with A SIP OF WATER , duloxetine (cymbalta), fexofenadine (allegra), metoprolol (Toprol-XL).nitro if needed  Stop Brilinta,aspirin as instructed by your surgeon.  Take all other medications as prescribed except 7 days prior to surgery STOP taking any Aspirin, Aleve, Naproxen, Ibuprofen, Motrin, Advil, Goody's, BC's, all herbal medications, fish oil, and all vitamins,probiotic,niaspan   Do not wear jewelry, make-up or nail polish.  Do not wear lotions, powders, or perfumes, or deoderant.  Do not shave 48 hours prior to surgery.    Do not bring valuables to the hospital.  [facility] is not responsible for any belongings or valuables.  Contacts, dentures or bridgework may not be worn into surgery.  Leave your suitcase in the car.  After surgery it may be brought to your room.  For patients admitted to the hospital, discharge time will be determined by your treatment team.  Patients discharged the day of surgery will not be allowed to drive home.   Special instructions:   [place]- Preparing For Surgery  Before surgery, you can play an important role. Because skin is not sterile, your skin needs to be as free of germs as possible. You can reduce the number of germs on your skin by washing with CHG (chlorahexidine gluconate) Soap before surgery.  CHG is an antiseptic cleaner which kills germs and bonds with the skin to continue killing germs even after washing.  Please do not use if you have an allergy to CHG or antibacterial soaps.  If your skin becomes reddened/irritated stop using the CHG.  Do not shave (including legs and underarms) for at least 48 hours prior to first CHG shower. It is OK to shave your face.  Please follow these instructions carefully.   1. Shower the NIGHT BEFORE SURGERY and the MORNING OF SURGERY with CHG.   2. If you chose to wash your hair, wash your hair first as usual with your normal shampoo.  3. After you shampoo, rinse your hair and body thoroughly to remove the shampoo.  4. Use CHG as you would any other liquid soap. You can apply CHG directly to the skin and wash gently with a scrungie or a clean washcloth.   5. Apply the CHG Soap to your body ONLY FROM THE NECK DOWN.  Do not use on open wounds or open sores. Avoid contact with your eyes, ears, mouth and genitals (private parts). Wash genitals (private parts) with your normal soap.  6. Wash thoroughly, paying special attention to the area where your surgery will be performed.  7. Thoroughly rinse your body with warm water from the neck down.  8. DO NOT shower/wash with your normal soap after using and rinsing off the CHG Soap.  9. Pat yourself dry with a CLEAN TOWEL.   10. Wear CLEAN PAJAMAS   11. Place CLEAN SHEETS on your bed the night of your first shower and DO NOT SLEEP WITH PETS.    Day of Surgery: Do not apply any deodorants/lotions.  Please wear clean clothes to the hospital/surgery center.     Please read over the  fact sheets that you were given.

## 2017-02-27 ENCOUNTER — Encounter (HOSPITAL_COMMUNITY)
Admission: RE | Admit: 2017-02-27 | Discharge: 2017-02-27 | Disposition: A | Discharge status: Home / Self Care | Payer: BC Managed Care – PPO | Source: Ambulatory Visit | Attending: Neurological Surgery | Admitting: Neurological Surgery

## 2017-02-27 ENCOUNTER — Encounter (HOSPITAL_COMMUNITY): Payer: Self-pay

## 2017-02-27 DIAGNOSIS — S32009K Unspecified fracture of unspecified lumbar vertebra, subsequent encounter for fracture with nonunion: Secondary | ICD-10-CM

## 2017-02-27 DIAGNOSIS — Z01818 Encounter for other preprocedural examination: Secondary | ICD-10-CM | POA: Diagnosis present

## 2017-02-27 DIAGNOSIS — Z01812 Encounter for preprocedural laboratory examination: Secondary | ICD-10-CM | POA: Diagnosis not present

## 2017-02-27 DIAGNOSIS — Z01810 Encounter for preprocedural cardiovascular examination: Secondary | ICD-10-CM | POA: Insufficient documentation

## 2017-02-27 HISTORY — DX: Dyspnea, unspecified: R06.00

## 2017-02-27 HISTORY — DX: Headache, unspecified: R51.9

## 2017-02-27 HISTORY — DX: Headache: R51

## 2017-02-27 LAB — TYPE AND SCREEN
ABO/RH(D): A POS
Antibody Screen: NEGATIVE

## 2017-02-27 LAB — CBC WITH DIFFERENTIAL/PLATELET
BASOS PCT: 1 %
Basophils Absolute: 0.1 10*3/uL (ref 0.0–0.1)
Eosinophils Absolute: 0.2 10*3/uL (ref 0.0–0.7)
Eosinophils Relative: 4 %
HEMATOCRIT: 42.2 % (ref 36.0–46.0)
Hemoglobin: 14.1 g/dL (ref 12.0–15.0)
LYMPHS PCT: 47 %
Lymphs Abs: 2.6 10*3/uL (ref 0.7–4.0)
MCH: 31 pg (ref 26.0–34.0)
MCHC: 33.4 g/dL (ref 30.0–36.0)
MCV: 92.7 fL (ref 78.0–100.0)
MONO ABS: 0.4 10*3/uL (ref 0.1–1.0)
MONOS PCT: 7 %
NEUTROS ABS: 2.3 10*3/uL (ref 1.7–7.7)
Neutrophils Relative %: 41 %
Platelets: 173 10*3/uL (ref 150–400)
RBC: 4.55 MIL/uL (ref 3.87–5.11)
RDW: 12.8 % (ref 11.5–15.5)
WBC: 5.7 10*3/uL (ref 4.0–10.5)

## 2017-02-27 LAB — BASIC METABOLIC PANEL
ANION GAP: 10 (ref 5–15)
BUN: 16 mg/dL (ref 6–20)
CALCIUM: 9.1 mg/dL (ref 8.9–10.3)
CO2: 22 mmol/L (ref 22–32)
Chloride: 104 mmol/L (ref 101–111)
Creatinine, Ser: 0.99 mg/dL (ref 0.44–1.00)
GFR calc Af Amer: >60 mL/min (ref >60)
GFR calc non Af Amer: 59 mL/min — ABNORMAL LOW (ref >60)
GLUCOSE: 107 mg/dL — AB (ref 65–99)
Potassium: 4.2 mmol/L (ref 3.5–5.1)
Sodium: 136 mmol/L (ref 135–145)

## 2017-02-27 LAB — SURGICAL PCR SCREEN
MRSA, PCR: NEGATIVE
Staphylococcus aureus: POSITIVE — AB

## 2017-02-28 ENCOUNTER — Encounter (HOSPITAL_COMMUNITY): Payer: Self-pay

## 2017-02-28 NOTE — Progress Notes (Signed)
Anesthesia chart review: Patient is a 66 year old female scheduled for posterior lateral fusion, L4-5, L5-S1, removal replacement of pedicle screws L4-S1 on 03/07/17 by Dr. Sherley Bounds.  History includes never smoker, CAD, NSTEMI 02/16/16 ([place], [place]) s/p DES LAD and DES D1 02/16/16 [facility]) and DES RCA 03/08/16 [facility]), HLD, RLS, anxiety, depression, hypothyroidism, migraines, Grover's disease, arthritis, skin cancer (BCC), anemia, dyspnea, appendectomy, breast augmentation, back surgery.    Meds include Xanax, aspirin 81 mg, Brilinta, Cymbalta, Toprol-XL, Niaspan, nitroglycerin, Crestor, Allegra.   - PCP is [name], NP.  Cardiologist is Dr. Adrian Prows. I called and spoke with him directly regarding surgery timing. She is s/p DES LAD and DES D1 02/15/16 and DES RCA 03/08/16. OR is scheduled for 03/07/17, and she is scheduled to hold Brilinta and ASA after 02/28/17 dose. He is okay with her proceeding as planned. (He also signed a clearance form indicating, she was "low risk for perioperative CV complication.").  BP 103/83   Pulse 65   Temp 36.4 C   Resp 18   Ht 5\' 5"  (1.651 m)   Wt 155 lb (70.3 kg)   SpO2 95%   BMI 25.79 kg/m   EKG 02/27/17: NSR, possible septal infarct (age undetermined). No significant change since last tracing.   Cardiac cath 03/08/16:  - LV: LV size is normal. LV systolic function is normal. LVEF 55-65% by visual estimate. There are no wall motion abnormalities in the left ventricle. - LAD: Previously placed Mid LAD to Dist LAD drug eluting stent is patent. PCI 02/15/16 2.75 x 23 mm Xience DES to Mid LAD - D1: Previously placed 1st Diag drug eluting stent is patent. PCI D-1 02/15/16: 2.5 x 15 mm Xience DES - Lateral D1 (small size): 80% stenosed. Located at the major branch.  - RCA: Proximal 90%. The lesion is type non-C Diffuse. The lesion was not previously treated. IVUS was performed on the lesion. Minimum lumen diameter: 3.5 mm. There is severe plaque burden  detected. IVUS has determined that the lesion is concentric. Severe plaque burden with catheter hugging and 85-90% stenosis. PCI: S/P scoring balloon angioplasty with a 3.0 x 10 mm balloon at 16 atmospheric pressure,followed by stenting with 3.5 x 15 mm resolute. 90% stenosis reduced to 0%. Post stenting IVUS revealing excellent lumen gain with 0% residual stenosis.  CXR 02/27/17: IMPRESSION: No active cardiopulmonary disease.  Preoperative labs noted.   Her cardiologist is in agreement with proceeding with surgery as scheduled. If no acute changes then I would anticipate that she can proceed as planned.  [facility] Stay Center/Anesthesiology Phone [phone] 02/28/2017 5:37 PM

## 2017-03-07 ENCOUNTER — Inpatient Hospital Stay (HOSPITAL_COMMUNITY)
Admission: RE | Admit: 2017-03-07 | Discharge: 2017-03-08 | DRG: 460 | Disposition: A | Discharge status: Home / Self Care | Payer: BC Managed Care – PPO | Source: Ambulatory Visit | Attending: Neurological Surgery | Admitting: Neurological Surgery

## 2017-03-07 ENCOUNTER — Inpatient Hospital Stay (HOSPITAL_COMMUNITY): Payer: BC Managed Care – PPO | Admitting: Vascular Surgery

## 2017-03-07 ENCOUNTER — Inpatient Hospital Stay (HOSPITAL_COMMUNITY): Payer: BC Managed Care – PPO | Admitting: Certified Registered Nurse Anesthetist

## 2017-03-07 ENCOUNTER — Encounter (HOSPITAL_COMMUNITY): Payer: Self-pay | Admitting: Neurological Surgery

## 2017-03-07 ENCOUNTER — Encounter (HOSPITAL_COMMUNITY)
Admission: RE | Disposition: A | Discharge status: Home / Self Care | Payer: Self-pay | Source: Ambulatory Visit | Attending: Neurological Surgery

## 2017-03-07 DIAGNOSIS — Z7982 Long term (current) use of aspirin: Secondary | ICD-10-CM

## 2017-03-07 DIAGNOSIS — Z800 Family history of malignant neoplasm of digestive organs: Secondary | ICD-10-CM

## 2017-03-07 DIAGNOSIS — Z981 Arthrodesis status: Secondary | ICD-10-CM

## 2017-03-07 DIAGNOSIS — Z85828 Personal history of other malignant neoplasm of skin: Secondary | ICD-10-CM | POA: Diagnosis not present

## 2017-03-07 DIAGNOSIS — F419 Anxiety disorder, unspecified: Secondary | ICD-10-CM | POA: Diagnosis present

## 2017-03-07 DIAGNOSIS — G2581 Restless legs syndrome: Secondary | ICD-10-CM | POA: Diagnosis present

## 2017-03-07 DIAGNOSIS — F329 Major depressive disorder, single episode, unspecified: Secondary | ICD-10-CM | POA: Diagnosis present

## 2017-03-07 DIAGNOSIS — Z955 Presence of coronary angioplasty implant and graft: Secondary | ICD-10-CM

## 2017-03-07 DIAGNOSIS — E039 Hypothyroidism, unspecified: Secondary | ICD-10-CM | POA: Diagnosis present

## 2017-03-07 DIAGNOSIS — Y793 Surgical instruments, materials and orthopedic devices (including sutures) associated with adverse incidents: Secondary | ICD-10-CM | POA: Diagnosis present

## 2017-03-07 DIAGNOSIS — T84028A Dislocation of other internal joint prosthesis, initial encounter: Secondary | ICD-10-CM | POA: Diagnosis present

## 2017-03-07 DIAGNOSIS — I2510 Atherosclerotic heart disease of native coronary artery without angina pectoris: Secondary | ICD-10-CM | POA: Diagnosis present

## 2017-03-07 DIAGNOSIS — M960 Pseudarthrosis after fusion or arthrodesis: Secondary | ICD-10-CM | POA: Diagnosis present

## 2017-03-07 DIAGNOSIS — M79604 Pain in right leg: Secondary | ICD-10-CM | POA: Diagnosis present

## 2017-03-07 DIAGNOSIS — I252 Old myocardial infarction: Secondary | ICD-10-CM

## 2017-03-07 DIAGNOSIS — Z7901 Long term (current) use of anticoagulants: Secondary | ICD-10-CM

## 2017-03-07 DIAGNOSIS — Y838 Other surgical procedures as the cause of abnormal reaction of the patient, or of later complication, without mention of misadventure at the time of the procedure: Secondary | ICD-10-CM | POA: Diagnosis present

## 2017-03-07 HISTORY — PX: LAMINECTOMY WITH POSTERIOR LATERAL ARTHRODESIS LEVEL 2: SHX6336

## 2017-03-07 LAB — PROTIME-INR
INR: 1.05
Prothrombin Time: 13.7 seconds (ref 11.4–15.2)

## 2017-03-07 SURGERY — LAMINECTOMY WITH POSTERIOR LATERAL ARTHRODESIS LEVEL 2
Anesthesia: General | Site: Spine Lumbar

## 2017-03-07 MED ORDER — SUGAMMADEX SODIUM 500 MG/5ML IV SOLN
INTRAVENOUS | Status: DC | PRN
  Administered 2017-03-07: 280 mg via INTRAVENOUS

## 2017-03-07 MED ORDER — DEXAMETHASONE SODIUM PHOSPHATE 10 MG/ML IJ SOLN
INTRAMUSCULAR | Status: AC
  Filled 2017-03-07: qty 1

## 2017-03-07 MED ORDER — ROCURONIUM BROMIDE 100 MG/10ML IV SOLN
INTRAVENOUS | Status: DC | PRN
  Administered 2017-03-07: 50 mg via INTRAVENOUS
  Administered 2017-03-07: 20 mg via INTRAVENOUS

## 2017-03-07 MED ORDER — LIDOCAINE 2% (20 MG/ML) 5 ML SYRINGE
INTRAMUSCULAR | Status: AC
  Filled 2017-03-07: qty 5

## 2017-03-07 MED ORDER — FENTANYL CITRATE (PF) 100 MCG/2ML IJ SOLN
INTRAMUSCULAR | Status: DC | PRN
Start: 2017-03-07 — End: 2017-03-07
  Administered 2017-03-07: 50 ug via INTRAVENOUS
  Administered 2017-03-07: 100 ug via INTRAVENOUS
  Administered 2017-03-07 (×2): 50 ug via INTRAVENOUS

## 2017-03-07 MED ORDER — THROMBIN 20000 UNITS EX SOLR
CUTANEOUS | Status: AC
  Filled 2017-03-07: qty 20000

## 2017-03-07 MED ORDER — PROPOFOL 10 MG/ML IV BOLUS
INTRAVENOUS | Status: DC | PRN
  Administered 2017-03-07: 130 mg via INTRAVENOUS

## 2017-03-07 MED ORDER — MEPERIDINE HCL 25 MG/ML IJ SOLN: 6.2500 mg | INTRAMUSCULAR | Status: DC | PRN

## 2017-03-07 MED ORDER — PHENOL 1.4 % MT LIQD: 1.0000 | OROMUCOSAL | Status: DC | PRN

## 2017-03-07 MED ORDER — POTASSIUM CHLORIDE IN NACL 20-0.9 MEQ/L-% IV SOLN
INTRAVENOUS | Status: DC
  Filled 2017-03-07: qty 1000

## 2017-03-07 MED ORDER — METHOCARBAMOL 500 MG PO TABS
500.0000 mg | ORAL_TABLET | Freq: Four times a day (QID) | ORAL | Status: DC | PRN
  Administered 2017-03-07 – 2017-03-08 (×2): 500 mg via ORAL
  Filled 2017-03-07 (×2): qty 1

## 2017-03-07 MED ORDER — SENNA 8.6 MG PO TABS
1.0000 | ORAL_TABLET | Freq: Two times a day (BID) | ORAL | Status: DC
  Administered 2017-03-07: 8.6 mg via ORAL
  Filled 2017-03-07 (×2): qty 1

## 2017-03-07 MED ORDER — 0.9 % SODIUM CHLORIDE (POUR BTL) OPTIME
TOPICAL | Status: DC | PRN
  Administered 2017-03-07: 1000 mL

## 2017-03-07 MED ORDER — METOPROLOL SUCCINATE ER 25 MG PO TB24
25.0000 mg | ORAL_TABLET | Freq: Every day | ORAL | Status: DC
  Administered 2017-03-08: 25 mg via ORAL
  Filled 2017-03-07: qty 1

## 2017-03-07 MED ORDER — ASPIRIN EC 81 MG PO TBEC
81.0000 mg | DELAYED_RELEASE_TABLET | Freq: Every day | ORAL | Status: DC
  Administered 2017-03-07: 81 mg via ORAL
  Filled 2017-03-07 (×2): qty 1

## 2017-03-07 MED ORDER — SODIUM CHLORIDE 0.9% FLUSH: 3.0000 mL | Freq: Two times a day (BID) | INTRAVENOUS | Status: DC

## 2017-03-07 MED ORDER — MORPHINE SULFATE (PF) 4 MG/ML IV SOLN
2.0000 mg | INTRAVENOUS | Status: DC | PRN
Start: 2017-03-07 — End: 2017-03-08
  Administered 2017-03-08 (×2): 2 mg via INTRAVENOUS
  Filled 2017-03-07 (×2): qty 1

## 2017-03-07 MED ORDER — CELECOXIB 200 MG PO CAPS
200.0000 mg | ORAL_CAPSULE | Freq: Two times a day (BID) | ORAL | Status: DC
  Administered 2017-03-08: 200 mg via ORAL
  Filled 2017-03-07: qty 1

## 2017-03-07 MED ORDER — HYDROMORPHONE HCL 1 MG/ML IJ SOLN
INTRAMUSCULAR | Status: AC
  Filled 2017-03-07: qty 1

## 2017-03-07 MED ORDER — MIDAZOLAM HCL 2 MG/2ML IJ SOLN
INTRAMUSCULAR | Status: AC
  Filled 2017-03-07: qty 2

## 2017-03-07 MED ORDER — THROMBIN 20000 UNITS EX SOLR
CUTANEOUS | Status: DC | PRN
  Administered 2017-03-07: 15:00:00 via TOPICAL

## 2017-03-07 MED ORDER — OXYCODONE HCL 5 MG PO TABS
5.0000 mg | ORAL_TABLET | ORAL | Status: DC | PRN
  Administered 2017-03-07 – 2017-03-08 (×2): 10 mg via ORAL
  Filled 2017-03-07 (×3): qty 2

## 2017-03-07 MED ORDER — ROCURONIUM BROMIDE 10 MG/ML (PF) SYRINGE
PREFILLED_SYRINGE | INTRAVENOUS | Status: AC
  Filled 2017-03-07: qty 5

## 2017-03-07 MED ORDER — LACTATED RINGERS IV SOLN
INTRAVENOUS | Status: DC | PRN
  Administered 2017-03-07 (×2): via INTRAVENOUS

## 2017-03-07 MED ORDER — LACTATED RINGERS IV SOLN: INTRAVENOUS | Status: DC | PRN

## 2017-03-07 MED ORDER — ONDANSETRON HCL 4 MG/2ML IJ SOLN
INTRAMUSCULAR | Status: DC | PRN
Start: 2017-03-07 — End: 2017-03-07
  Administered 2017-03-07: 4 mg via INTRAVENOUS

## 2017-03-07 MED ORDER — METHOCARBAMOL 500 MG PO TABS
ORAL_TABLET | ORAL | Status: AC
  Filled 2017-03-07: qty 1

## 2017-03-07 MED ORDER — ACETAMINOPHEN 325 MG PO TABS: 650.0000 mg | ORAL_TABLET | ORAL | Status: DC | PRN

## 2017-03-07 MED ORDER — SODIUM CHLORIDE 0.9 % IR SOLN
Status: DC | PRN
  Administered 2017-03-07: 15:00:00

## 2017-03-07 MED ORDER — DEXAMETHASONE SODIUM PHOSPHATE 10 MG/ML IJ SOLN
10.0000 mg | INTRAMUSCULAR | Status: AC
  Administered 2017-03-07: 10 mg via INTRAVENOUS
  Filled 2017-03-07: qty 1

## 2017-03-07 MED ORDER — FENTANYL CITRATE (PF) 250 MCG/5ML IJ SOLN
INTRAMUSCULAR | Status: AC
  Filled 2017-03-07: qty 5

## 2017-03-07 MED ORDER — PROMETHAZINE HCL 25 MG/ML IJ SOLN: 6.2500 mg | INTRAMUSCULAR | Status: DC | PRN

## 2017-03-07 MED ORDER — OXYCODONE HCL 5 MG PO TABS
ORAL_TABLET | ORAL | Status: AC
  Filled 2017-03-07: qty 1

## 2017-03-07 MED ORDER — OXYCODONE HCL 5 MG PO TABS
5.0000 mg | ORAL_TABLET | Freq: Once | ORAL | Status: AC | PRN
  Administered 2017-03-07: 5 mg via ORAL

## 2017-03-07 MED ORDER — BUPIVACAINE HCL (PF) 0.25 % IJ SOLN
INTRAMUSCULAR | Status: AC
  Filled 2017-03-07: qty 30

## 2017-03-07 MED ORDER — NIACIN ER (ANTIHYPERLIPIDEMIC) 500 MG PO TBCR
1000.0000 mg | EXTENDED_RELEASE_TABLET | Freq: Every evening | ORAL | Status: DC
  Administered 2017-03-07: 1000 mg via ORAL
  Filled 2017-03-07: qty 2

## 2017-03-07 MED ORDER — THROMBIN 5000 UNITS EX SOLR
CUTANEOUS | Status: AC
  Filled 2017-03-07: qty 5000

## 2017-03-07 MED ORDER — CHLORHEXIDINE GLUCONATE CLOTH 2 % EX PADS: 6.0000 | MEDICATED_PAD | Freq: Once | CUTANEOUS | Status: DC

## 2017-03-07 MED ORDER — CEFAZOLIN SODIUM-DEXTROSE 2-4 GM/100ML-% IV SOLN
2.0000 g | INTRAVENOUS | Status: AC
  Administered 2017-03-07: 2 g via INTRAVENOUS
  Filled 2017-03-07: qty 100

## 2017-03-07 MED ORDER — SODIUM CHLORIDE 0.9% FLUSH: 3.0000 mL | INTRAVENOUS | Status: DC | PRN

## 2017-03-07 MED ORDER — SUGAMMADEX SODIUM 500 MG/5ML IV SOLN
INTRAVENOUS | Status: AC
  Filled 2017-03-07: qty 5

## 2017-03-07 MED ORDER — ALPRAZOLAM 0.5 MG PO TABS: 1.0000 mg | ORAL_TABLET | Freq: Every day | ORAL | Status: DC

## 2017-03-07 MED ORDER — OXYCODONE HCL 5 MG/5ML PO SOLN: 5.0000 mg | Freq: Once | ORAL | Status: AC | PRN

## 2017-03-07 MED ORDER — DULOXETINE HCL 60 MG PO CPEP
60.0000 mg | ORAL_CAPSULE | Freq: Every day | ORAL | Status: DC
  Filled 2017-03-07: qty 1

## 2017-03-07 MED ORDER — ONDANSETRON HCL 4 MG/2ML IJ SOLN: 4.0000 mg | Freq: Four times a day (QID) | INTRAMUSCULAR | Status: DC | PRN

## 2017-03-07 MED ORDER — PHENYLEPHRINE HCL 10 MG/ML IJ SOLN
INTRAMUSCULAR | Status: DC | PRN
  Administered 2017-03-07 (×2): 80 ug via INTRAVENOUS
  Administered 2017-03-07: 120 ug via INTRAVENOUS

## 2017-03-07 MED ORDER — HYDROMORPHONE HCL 1 MG/ML IJ SOLN
0.2500 mg | INTRAMUSCULAR | Status: DC | PRN
  Administered 2017-03-07 (×4): 0.5 mg via INTRAVENOUS

## 2017-03-07 MED ORDER — CEFAZOLIN SODIUM-DEXTROSE 2-4 GM/100ML-% IV SOLN
2.0000 g | Freq: Three times a day (TID) | INTRAVENOUS | Status: AC
  Administered 2017-03-07 – 2017-03-08 (×2): 2 g via INTRAVENOUS
  Filled 2017-03-07 (×2): qty 100

## 2017-03-07 MED ORDER — MIDAZOLAM HCL 5 MG/5ML IJ SOLN
INTRAMUSCULAR | Status: DC | PRN
  Administered 2017-03-07: 2 mg via INTRAVENOUS

## 2017-03-07 MED ORDER — SODIUM CHLORIDE 0.9 % IV SOLN: 250.0000 mL | INTRAVENOUS | Status: DC

## 2017-03-07 MED ORDER — ONDANSETRON HCL 4 MG/2ML IJ SOLN
INTRAMUSCULAR | Status: AC
  Filled 2017-03-07: qty 2

## 2017-03-07 MED ORDER — LIDOCAINE HCL (CARDIAC) 20 MG/ML IV SOLN
INTRAVENOUS | Status: DC | PRN
  Administered 2017-03-07: 40 mg via INTRAVENOUS

## 2017-03-07 MED ORDER — GELATIN ABSORBABLE MT POWD
OROMUCOSAL | Status: DC | PRN
  Administered 2017-03-07: 15:00:00 via TOPICAL

## 2017-03-07 MED ORDER — BUPIVACAINE HCL (PF) 0.25 % IJ SOLN
INTRAMUSCULAR | Status: DC | PRN
Start: 2017-03-07 — End: 2017-03-07
  Administered 2017-03-07: 5 mL

## 2017-03-07 MED ORDER — METHOCARBAMOL 1000 MG/10ML IJ SOLN
500.0000 mg | Freq: Four times a day (QID) | INTRAMUSCULAR | Status: DC | PRN
  Filled 2017-03-07: qty 5

## 2017-03-07 MED ORDER — ONDANSETRON HCL 4 MG PO TABS: 4.0000 mg | ORAL_TABLET | Freq: Four times a day (QID) | ORAL | Status: DC | PRN

## 2017-03-07 MED ORDER — PHENYLEPHRINE 40 MCG/ML (10ML) SYRINGE FOR IV PUSH (FOR BLOOD PRESSURE SUPPORT)
PREFILLED_SYRINGE | INTRAVENOUS | Status: AC
  Filled 2017-03-07: qty 10

## 2017-03-07 MED ORDER — MENTHOL 3 MG MT LOZG: 1.0000 | LOZENGE | OROMUCOSAL | Status: DC | PRN

## 2017-03-07 MED ORDER — ACETAMINOPHEN 650 MG RE SUPP: 650.0000 mg | RECTAL | Status: DC | PRN

## 2017-03-07 SURGICAL SUPPLY — 61 items
BAG DECANTER FOR FLEXI CONT (MISCELLANEOUS) ×3 IMPLANT
BASKET BONE COLLECTION (BASKET) IMPLANT
BENZOIN TINCTURE PRP APPL 2/3 (GAUZE/BANDAGES/DRESSINGS) ×3 IMPLANT
BLADE CLIPPER SURG (BLADE) IMPLANT
BONE CANC CHIPS 40CC CAN1/2 (Bone Implant) ×3 IMPLANT
BUR MATCHSTICK NEURO 3.0 LAGG (BURR) ×3 IMPLANT
CANISTER SUCT 3000ML PPV (MISCELLANEOUS) ×3 IMPLANT
CARTRIDGE OIL MAESTRO DRILL (MISCELLANEOUS) ×1 IMPLANT
CHIPS CANC BONE 40CC CAN1/2 (Bone Implant) ×1 IMPLANT
CLOSURE WOUND 1/2 X4 (GAUZE/BANDAGES/DRESSINGS) ×1
CONT SPEC 4OZ CLIKSEAL STRL BL (MISCELLANEOUS) ×3 IMPLANT
COVER BACK TABLE 60X90IN (DRAPES) ×3 IMPLANT
DERMABOND ADVANCED (GAUZE/BANDAGES/DRESSINGS) ×2
DERMABOND ADVANCED .7 DNX12 (GAUZE/BANDAGES/DRESSINGS) ×1 IMPLANT
DIFFUSER DRILL AIR PNEUMATIC (MISCELLANEOUS) ×3 IMPLANT
DRAPE C-ARM 42X72 X-RAY (DRAPES) IMPLANT
DRAPE LAPAROTOMY 100X72X124 (DRAPES) ×3 IMPLANT
DRAPE POUCH INSTRU U-SHP 10X18 (DRAPES) ×3 IMPLANT
DRAPE SURG 17X23 STRL (DRAPES) ×3 IMPLANT
DRSG OPSITE 4X5.5 SM (GAUZE/BANDAGES/DRESSINGS) ×3 IMPLANT
DRSG OPSITE POSTOP 4X6 (GAUZE/BANDAGES/DRESSINGS) ×3 IMPLANT
DURAPREP 26ML APPLICATOR (WOUND CARE) ×3 IMPLANT
ELECT REM PT RETURN 9FT ADLT (ELECTROSURGICAL) ×3
ELECTRODE REM PT RTRN 9FT ADLT (ELECTROSURGICAL) ×1 IMPLANT
EVACUATOR 1/8 PVC DRAIN (DRAIN) ×3 IMPLANT
GAUZE SPONGE 4X4 16PLY XRAY LF (GAUZE/BANDAGES/DRESSINGS) IMPLANT
GLOVE BIO SURGEON STRL SZ7 (GLOVE) IMPLANT
GLOVE BIO SURGEON STRL SZ8 (GLOVE) ×6 IMPLANT
GLOVE BIOGEL PI IND STRL 7.0 (GLOVE) IMPLANT
GLOVE BIOGEL PI IND STRL 7.5 (GLOVE) ×4 IMPLANT
GLOVE BIOGEL PI INDICATOR 7.0 (GLOVE)
GLOVE BIOGEL PI INDICATOR 7.5 (GLOVE) ×8
GLOVE ECLIPSE 7.0 STRL STRAW (GLOVE) ×3 IMPLANT
GLOVE SS N UNI LF 7.0 STRL (GLOVE) ×9 IMPLANT
GLOVE SS N UNI LF 7.5 STRL (GLOVE) ×9 IMPLANT
GOWN STRL REUS W/ TWL LRG LVL3 (GOWN DISPOSABLE) ×1 IMPLANT
GOWN STRL REUS W/ TWL XL LVL3 (GOWN DISPOSABLE) ×2 IMPLANT
GOWN STRL REUS W/TWL 2XL LVL3 (GOWN DISPOSABLE) IMPLANT
GOWN STRL REUS W/TWL LRG LVL3 (GOWN DISPOSABLE) ×2
GOWN STRL REUS W/TWL XL LVL3 (GOWN DISPOSABLE) ×4
HEMOSTAT POWDER KIT SURGIFOAM (HEMOSTASIS) ×3 IMPLANT
KIT BASIN OR (CUSTOM PROCEDURE TRAY) ×3 IMPLANT
KIT ROOM TURNOVER OR (KITS) ×3 IMPLANT
NEEDLE ASP BONE MRW 8GX15 (NEEDLE) ×3 IMPLANT
NEEDLE HYPO 25X1 1.5 SAFETY (NEEDLE) ×3 IMPLANT
NS IRRIG 1000ML POUR BTL (IV SOLUTION) ×3 IMPLANT
OIL CARTRIDGE MAESTRO DRILL (MISCELLANEOUS) ×3
PACK LAMINECTOMY NEURO (CUSTOM PROCEDURE TRAY) ×3 IMPLANT
PAD ARMBOARD 7.5X6 YLW CONV (MISCELLANEOUS) ×9 IMPLANT
PUTTY BONE ATTRAX 5CC STRIP (Putty) ×3 IMPLANT
SPONGE LAP 4X18 X RAY DECT (DISPOSABLE) IMPLANT
SPONGE SURGIFOAM ABS GEL 100 (HEMOSTASIS) ×3 IMPLANT
STRIP CLOSURE SKIN 1/2X4 (GAUZE/BANDAGES/DRESSINGS) ×2 IMPLANT
SUT VIC AB 0 CT1 18XCR BRD8 (SUTURE) ×2 IMPLANT
SUT VIC AB 0 CT1 8-18 (SUTURE) ×4
SUT VIC AB 2-0 CP2 18 (SUTURE) ×6 IMPLANT
SUT VIC AB 3-0 SH 8-18 (SUTURE) ×6 IMPLANT
TOWEL GREEN STERILE (TOWEL DISPOSABLE) ×2 IMPLANT
TOWEL GREEN STERILE FF (TOWEL DISPOSABLE) ×3 IMPLANT
TRAY FOLEY W/METER SILVER 16FR (SET/KITS/TRAYS/PACK) IMPLANT
WATER STERILE IRR 1000ML POUR (IV SOLUTION) ×3 IMPLANT

## 2017-03-07 NOTE — Anesthesia Postprocedure Evaluation (Addendum)
Anesthesia Post Note  Patient: Monique Miller  Procedure(s) Performed: Procedure(s) (LRB): Lumbar Four-Five, Lumbar Five-Sacral One Posterior Lateral Fusion with Removal of pedicle screws (N/A)  Patient location during evaluation: PACU Anesthesia Type: General Level of consciousness: awake, awake and alert and oriented Pain management: pain level controlled Vital Signs Assessment: post-procedure vital signs reviewed and stable Respiratory status: spontaneous breathing, nonlabored ventilation and respiratory function stable Cardiovascular status: blood pressure returned to baseline Anesthetic complications: no       Last Vitals:  Vitals:   03/07/17 1805 03/07/17 1833  BP: 133/68 133/68  Pulse: 74 79  Resp: 16 18  Temp: 36.1 C 36.7 C    Last Pain:  Vitals:   03/07/17 1833  TempSrc: Oral  PainSc:                  Shondell Poulson COKER

## 2017-03-07 NOTE — Anesthesia Procedure Notes (Signed)
Procedure Name: Intubation Date/Time: 03/07/2017 2:12 PM Performed by: Clearnce Sorrel Pre-anesthesia Checklist: Patient identified, Emergency Drugs available, Suction available, Patient being monitored and Timeout performed Patient Re-evaluated:Patient Re-evaluated prior to inductionOxygen Delivery Method: Circle system utilized Preoxygenation: Pre-oxygenation with 100% oxygen Intubation Type: IV induction Ventilation: Mask ventilation without difficulty Laryngoscope Size: Mac and 3 Grade View: Grade I Tube type: Oral Tube size: 7.0 mm Number of attempts: 1 Airway Equipment and Method: Stylet Placement Confirmation: ETT inserted through vocal cords under direct vision,  positive ETCO2 and breath sounds checked- equal and bilateral Secured at: 22 cm Tube secured with: Tape Dental Injury: Teeth and Oropharynx as per pre-operative assessment

## 2017-03-07 NOTE — Anesthesia Preprocedure Evaluation (Addendum)
Anesthesia Evaluation  Patient identified by MRN, date of birth, ID band Patient awake    Reviewed: Allergy & Precautions, H&P , NPO status , Patient's Chart, lab work & pertinent test results  History of Anesthesia Complications (+) PONVNegative for: history of anesthetic complications  Airway Mallampati: II  TM Distance: >3 FB Neck ROM: Full    Dental  (+) Teeth Intact, Dental Advisory Given   Pulmonary neg pulmonary ROS,    Pulmonary exam normal breath sounds clear to auscultation       Cardiovascular + CAD and + Past MI  negative cardio ROS Normal cardiovascular exam Rhythm:Regular Rate:Normal     Neuro/Psych  Headaches, PSYCHIATRIC DISORDERS Anxiety Depression    GI/Hepatic negative GI ROS, Neg liver ROS,   Endo/Other  negative endocrine ROS  Renal/GU negative Renal ROS     Musculoskeletal  (+) Arthritis ,   Abdominal   Peds  Hematology   Anesthesia Other Findings   Reproductive/Obstetrics                            Anesthesia Physical  Anesthesia Plan  ASA: III  Anesthesia Plan: General   Post-op Pain Management:    Induction: Intravenous  Airway Management Planned: Oral ETT  Additional Equipment:   Intra-op Plan:   Post-operative Plan: Extubation in OR  Informed Consent: I have reviewed the patients History and Physical, chart, labs and discussed the procedure including the risks, benefits and alternatives for the proposed anesthesia with the patient or authorized representative who has indicated his/her understanding and acceptance.   Dental advisory given  Plan Discussed with: CRNA, Anesthesiologist and Surgeon  Anesthesia Plan Comments:         Anesthesia Quick Evaluation

## 2017-03-07 NOTE — Op Note (Signed)
03/07/2017  3:47 PM  PATIENT:  Monique Miller  66 y.o. female  PRE-OPERATIVE DIAGNOSIS:  Pseudoarthrosis L4-5 and L5-S1, loosened hardware L4-S1, displaced right S1 pedicle screw, back and right leg pain  POST-OPERATIVE DIAGNOSIS:  same  PROCEDURE:  1. Lumbar reexploration with removal of segmental fixation L4-S1 bilaterally, 2. Exploration of fusion L4-S1 bilaterally to confirm pseudoarthrosis, 3. Bone marrow aspirate from the right iliac crest through a separate fascial incision, 4. Bilateral posterior lateral fusion L4-S1 utilizing morselized allograft soaked with bone marrow aspirate  SURGEON:  Sherley Bounds, MD  ASSISTANTS: Kathyrn Sheriff MD  ANESTHESIA:   General  EBL: 100 ml  Total I/O In: 1000 [I.V.:1000] Out: 300 [Urine:200; Blood:100]  BLOOD ADMINISTERED: none  DRAINS: med hemovac  SPECIMEN:  none  INDICATION FOR PROCEDURE: This patient presented with severe right leg pain. Imaging showed pseudoarthrosis L4-5 and L5-S1 with loosened hardware and it displaced pedicle screw. Surgery was performed by another surgeon a couple of years ago. The patient tried conservative measures without relief. Pain was debilitating. Recommended lumbar reexploration with removal of hardware and redo fusion. Patient understood the risks, benefits, and alternatives and potential outcomes and wished to proceed.  PROCEDURE DETAILS: The patient was taken to the operating room and after induction of adequate generalized endotracheal anesthesia, the patient was rolled into the prone position on the Wilson frame and all pressure points were padded. The lumbar region was cleaned and then prepped with DuraPrep and draped in the usual sterile fashion. 5 cc of local anesthesia was injected and then a dorsal midline incision was made and carried down to the lumbo sacral fascia. The fascia was opened and the paraspinous musculature was taken down in a subperiosteal fashion to expose the previously placed hardware  bilaterally. I removed the locking caps from the pedicle screws and then removed the rods. I then removed each pedicle screw. I so no spinal fluid coming from the pedicle screw hole at L5-S1 on the right. The fusion was explored to confirm pseudoarthrosis. I dissected in a suprafascial plane to identify the right iliac crest. A Jamshidi needle was then used to extract about 15-20 mL of bone marrow aspirate from the right iliac crest. The iliac crest was dried with Surgifoam and the fascia was closed. I then used the Bovie cautery to identify the transverse processes of L4 and L5 bilaterally as well as the sacral ala bilaterally. I used the high-speed drill to decorticate the transverse processes and ala. I then placed a mixture of morcellized allograft soaked with bone marrow aspirate out over the transverse processes to perform arthrodesis bilaterally L4-S1. I irrigated with saline solution containing bacitracin. Achieved hemostasis with bipolar cautery, placed a medium Hemovac drain through a separate stab incision, and then closed the fascia with 0 Vicryl. I closed the subcutaneous tissues with 2-0 Vicryl and the subcuticular tissues with 3-0 Vicryl. The skin was then closed with benzoin and Steri-Strips. The drapes were removed, a sterile dressing was applied. The patient was awakened from general anesthesia and transferred to the recovery room in stable condition. At the end of the procedure all sponge, needle and instrument counts were correct.    PLAN OF CARE: Admit for overnight observation  PATIENT DISPOSITION:  PACU - hemodynamically stable.   Delay start of Pharmacological VTE agent (>24hrs) due to surgical blood loss or risk of bleeding:  yes

## 2017-03-07 NOTE — Progress Notes (Signed)
Pt admitted to the unit from pacu; pt alert and verbally responsive; IV intact and transfusing; foley intact and unclamped; back incision has honeycomb dsg with small stain marked; hemavac intact; unremarkable and unclamped to suction; SCD's on; pt oriented to the unit and room; fall/safety precaution and prevention education completed. Bed alarm on; pt's skin clean, dry and intact with no pressure ulcer or open wounds noted except for back surgical incision. Family at bedside and call light within reach. Reported off oncoming RN. Delia Heady RN

## 2017-03-07 NOTE — Transfer of Care (Signed)
Immediate Anesthesia Transfer of Care Note  Patient: Monique Miller  Procedure(s) Performed: Procedure(s): Lumbar Four-Five, Lumbar Five-Sacral One Posterior Lateral Fusion with Removal of pedicle screws (N/A)  Patient Location: PACU  Anesthesia Type:General  Level of Consciousness: awake, alert  and oriented  Airway & Oxygen Therapy: Patient Spontanous Breathing  Post-op Assessment: Report given to RN and Patient moving all extremities X 4  Post vital signs: Reviewed and stable  Last Vitals:  Vitals:   03/07/17 1133  BP: 110/64  Pulse: 60  Resp: 20  Temp: 36.6 C    Last Pain:  Vitals:   03/07/17 1136  TempSrc:   PainSc: 8       Patients Stated Pain Goal: 5 (84/53/64 [id number])  Complications: No apparent anesthesia complications

## 2017-03-07 NOTE — H&P (Signed)
Subjective: Patient is a 66 y.o. female admitted for R leg pain. Onset of symptoms was several weeks ago, rapidly worsening since that time.  The pain is rated severe, intense, unremitting, and is located at the across the lower back and radiates to RLE. The pain is described as aching and occurs all day. The symptoms have been progressive. Symptoms are exacerbated by exercise. MRI or CT showed pseudoarthrosis L4-5 L5-s1 with loosened hardware  Past Medical History:  Diagnosis Date  . Allergy   . Anemia    "many years ago"  . Anxiety   . Arthritis   . Cancer ([place])    skin cancer (basal cell)  . Constipation by delayed colonic transit   . Coronary artery disease   . Depression    see Meridith Child psychotherapist and Trey Paula  . Dyspnea    occ  . Grover's disease   . Headache   . Hx of migraines   . NSTEMI (non-ST elevated myocardial infarction) ([place]) 02/2016  . Pneumonia    walking pneumonia  . Restless leg syndrome   . Thyroid disease    hx of hypothyroid    Past Surgical History:  Procedure Laterality Date  . APPENDECTOMY    . BACK SURGERY     L4-L5  . BREAST SURGERY     augmentation  . CARDIAC CATHETERIZATION N/A 03/08/2016   Procedure: Left Heart Cath and Coronary Angiography;  Surgeon: Adrian Prows, MD;  Location: [facility];  Service: Cardiovascular;  Laterality: N/A;  . CARDIAC CATHETERIZATION N/A 03/08/2016   Procedure: Coronary Stent Intervention;  Surgeon: Adrian Prows, MD;  Location: [facility];  Service: Cardiovascular;  Laterality: N/A;  Resolute 3.5x15  . CARDIAC CATHETERIZATION N/A 03/08/2016   Procedure: Intravascular Ultrasound/IVUS;  Surgeon: Adrian Prows, MD;  Location: [facility];  Service: Cardiovascular;  Laterality: N/A;  RCA MID  . CESAREAN SECTION     x2  . COLONOSCOPY    . CORONARY ANGIOGRAM  03/08/2016  . CORONARY ANGIOPLASTY     DES LAD, DES D1 02/16/16 [place] Darter, [place] DES RCA 03/08/16 [facility])    Prior to Admission medications   Medication Sig  Start Date End Date Taking? Authorizing Provider  ALPRAZolam Duanne Moron) 1 MG tablet Take 1-2 mg by mouth at bedtime.  10/11/11  Yes Historical Provider, MD  aspirin 81 MG tablet Take 81 mg by mouth daily.    Yes Historical Provider, MD  BRILINTA 90 MG TABS tablet Take 1 tablet by mouth 2 (two) times daily. 02/17/16  Yes Historical Provider, MD  docusate sodium (COLACE) 100 MG capsule Take 100 mg by mouth at bedtime.   Yes Historical Provider, MD  DULoxetine (CYMBALTA) 60 MG capsule Take 60 mg by mouth daily.   Yes Historical Provider, MD  fexofenadine (ALLEGRA) 180 MG tablet Take 180 mg by mouth daily as needed for allergies.    Yes Historical Provider, MD  metoprolol succinate (TOPROL-XL) 25 MG 24 hr tablet Take 25 mg by mouth daily.   Yes Historical Provider, MD  niacin (NIASPAN) 500 MG CR tablet Take 2 tablets by mouth every evening.  02/27/16  Yes Historical Provider, MD  nitroGLYCERIN (NITROSTAT) 0.4 MG SL tablet Place 0.4 mg under the tongue every 5 (five) minutes as needed for chest pain.  02/17/16  Yes Historical Provider, MD  Probiotic Product (PROBIOTIC DAILY PO) Take 1 capsule by mouth daily.   Yes Historical Provider, MD  rosuvastatin (CRESTOR) 20 MG tablet Take 20 mg by  mouth daily.   Yes Historical Provider, MD   Allergies  Allergen Reactions  . No Known Allergies     Social History  Substance Use Topics  . Smoking status: Never Smoker  . Smokeless tobacco: Never Used  . Alcohol use 4.2 oz/week    7 Glasses of wine per week     Comment: occassionally    Family History  Problem Relation Age of Onset  . Cancer Mother     pancreatic  . Cancer Maternal Uncle     pancreatic  . Cancer Maternal Grandmother     pancreatic     Review of Systems  Positive ROS: neg  All other systems have been reviewed and were otherwise negative with the exception of those mentioned in the HPI and as above.  Objective: Vital signs in last 24 hours:    General Appearance: Alert, cooperative,  no distress, appears stated age Head: Normocephalic, without obvious abnormality, atraumatic Eyes: PERRL, conjunctiva/corneas clear, EOM's intact    Neck: Supple, symmetrical, trachea midline Back: Symmetric, no curvature, ROM normal, no CVA tenderness Lungs:  respirations unlabored Heart: Regular rate and rhythm Abdomen: Soft, non-tender Extremities: Extremities normal, atraumatic, no cyanosis or edema Pulses: 2+ and symmetric all extremities Skin: Skin color, texture, turgor normal, no rashes or lesions  NEUROLOGIC:   Mental status: Alert and oriented x4,  no aphasia, good attention span, fund of knowledge, and memory Motor Exam - grossly normal Sensory Exam - grossly normal Reflexes: 1= Coordination - grossly normal Gait - grossly normal Balance - grossly normal Cranial Nerves: I: smell Not tested  II: visual acuity  OS: nl    OD: nl  II: visual fields Full to confrontation  II: pupils Equal, round, reactive to light  III,VII: ptosis None  III,IV,VI: extraocular muscles  Full ROM  V: mastication Normal  V: facial light touch sensation  Normal  V,VII: corneal reflex  Present  VII: facial muscle function - upper  Normal  VII: facial muscle function - lower Normal  VIII: hearing Not tested  IX: soft palate elevation  Normal  IX,X: gag reflex Present  XI: trapezius strength  5/5  XI: sternocleidomastoid strength 5/5  XI: neck flexion strength  5/5  XII: tongue strength  Normal    Data Review Lab Results  Component Value Date   WBC 5.7 02/27/2017   HGB 14.1 02/27/2017   HCT 42.2 02/27/2017   MCV 92.7 02/27/2017   PLT 173 02/27/2017   Lab Results  Component Value Date   NA 136 02/27/2017   K 4.2 02/27/2017   CL 104 02/27/2017   CO2 22 02/27/2017   BUN 16 02/27/2017   CREATININE 0.99 02/27/2017   GLUCOSE 107 (H) 02/27/2017   No results found for: INR, PROTIME  Assessment/Plan: Patient admitted for removal of hardware and PLF. Patient has failed a reasonable  attempt at conservative therapy.  I explained the condition and procedure to the patient and answered any questions.  Patient wishes to proceed with procedure as planned. Understands risks/ benefits and typical outcomes of procedure.   Isidora Laham S 03/07/2017 10:26 AM

## 2017-03-08 ENCOUNTER — Encounter (HOSPITAL_COMMUNITY): Payer: Self-pay | Admitting: Neurological Surgery

## 2017-03-08 MED ORDER — METHOCARBAMOL 500 MG PO TABS: 500.0000 mg | ORAL_TABLET | Freq: Four times a day (QID) | ORAL | 1 refills | Status: DC | PRN

## 2017-03-08 MED ORDER — OXYCODONE HCL 5 MG PO TABS: 5.0000 mg | ORAL_TABLET | ORAL | 0 refills | Status: DC | PRN

## 2017-03-08 NOTE — Care Management Note (Signed)
Case Management Note  Patient Details  Name: Monique Miller MRN: [id number] Date of Birth: 03-Dec-1950  Subjective/Objective:                    Action/Plan: Pt discharged home with self care. CM inquired about DME. Pt states she does not need any. Pt with transportation home.   Expected Discharge Date:  03/08/17               Expected Discharge Plan:  Home/Self Care  In-House Referral:     Discharge planning Services  CM Consult  Post Acute Care Choice:  Durable Medical Equipment (Pt denied need for any DME) Choice offered to:     DME Arranged:    DME Agency:     HH Arranged:    HH Agency:     Status of Service:  Completed, signed off  If discussed at [employer], dates discussed:    Additional Comments:  Pollie Friar, RN 03/08/2017, 1:53 PM

## 2017-03-08 NOTE — Discharge Summary (Signed)
Physician Discharge Summary  Patient ID: Monique Miller MRN: [id number] DOB/AGE: 64-May-1952 64 y.o.  Admit date: 03/07/2017 Discharge date: 03/08/2017  Admission Diagnoses: pseudoarthrosis    Discharge Diagnoses: same   Discharged Condition: good  Hospital Course: The patient was admitted on 03/07/2017 and taken to the operating room where the patient underwent removal of loosened hardware and re-do fusion L4-S1. The patient tolerated the procedure well and was taken to the recovery room and then to the floor in stable condition. The hospital course was routine. There were no complications. The wound remained clean dry and intact. Pt had appropriate back soreness. No complaints of leg pain or new N/T/W. The patient remained afebrile with stable vital signs, and tolerated a regular diet. The patient continued to increase activities, and pain was well controlled with oral pain medications.   Consults: None  Significant Diagnostic Studies:  Results for orders placed or performed during the hospital encounter of 03/07/17  Protime-INR  Result Value Ref Range   Prothrombin Time 13.7 11.4 - 15.2 seconds   INR 1.05     Chest 2 View  Result Date: 02/27/2017 CLINICAL DATA:  Preop evaluation for upcoming lumbar spurring EXAM: CHEST  2 VIEW COMPARISON:  None. FINDINGS: The heart size and mediastinal contours are within normal limits. Both lungs are clear. The visualized skeletal structures are unremarkable. IMPRESSION: No active cardiopulmonary disease. Electronically Signed   By: Inez Catalina M.D.   On: 02/27/2017 14:13    Antibiotics:  Anti-infectives    Start     Dose/Rate Route Frequency Ordered Stop   03/07/17 2200  ceFAZolin (ANCEF) IVPB 2g/100 mL premix     2 g 200 mL/hr over 30 Minutes Intravenous Every 8 hours 03/07/17 1836 03/08/17 0709   03/07/17 1514  bacitracin 50,000 Units in sodium chloride irrigation 0.9 % 500 mL irrigation  Status:  Discontinued       As needed 03/07/17 1514  03/07/17 1544   03/07/17 1037  ceFAZolin (ANCEF) IVPB 2g/100 mL premix     2 g 200 mL/hr over 30 Minutes Intravenous On call to O.R. 03/07/17 1037 03/07/17 1414      Discharge Exam: Blood pressure (!) 93/45, pulse 63, temperature 98.4 F (36.9 C), temperature source Oral, resp. rate 18, height 5\' 5"  (1.651 m), weight 70.3 kg (155 lb), SpO2 100 %. Neurologic: Grossly normal Dressing dry  Discharge Medications:   Allergies as of 03/08/2017      Reactions   No Known Allergies       Medication List    TAKE these medications   ALPRAZolam 1 MG tablet Commonly known as:  XANAX Take 1-2 mg by mouth at bedtime.   aspirin 81 MG tablet Take 81 mg by mouth daily.   BRILINTA 90 MG Tabs tablet Generic drug:  ticagrelor Take 1 tablet by mouth 2 (two) times daily.   docusate sodium 100 MG capsule Commonly known as:  COLACE Take 100 mg by mouth at bedtime.   DULoxetine 60 MG capsule Commonly known as:  CYMBALTA Take 60 mg by mouth daily.   fexofenadine 180 MG tablet Commonly known as:  ALLEGRA Take 180 mg by mouth daily as needed for allergies.   methocarbamol 500 MG tablet Commonly known as:  ROBAXIN Take 1 tablet (500 mg total) by mouth every 6 (six) hours as needed for muscle spasms.   metoprolol succinate 25 MG 24 hr tablet Commonly known as:  TOPROL-XL Take 25 mg by mouth daily.   niacin 500 MG  CR tablet Commonly known as:  NIASPAN Take 2 tablets by mouth every evening.   nitroGLYCERIN 0.4 MG SL tablet Commonly known as:  NITROSTAT Place 0.4 mg under the tongue every 5 (five) minutes as needed for chest pain.   oxyCODONE 5 MG immediate release tablet Commonly known as:  Oxy IR/ROXICODONE Take 1 tablet (5 mg total) by mouth every 4 (four) hours as needed for breakthrough pain.   PROBIOTIC DAILY PO Take 1 capsule by mouth daily.   rosuvastatin 20 MG tablet Commonly known as:  CRESTOR Take 20 mg by mouth daily.            Durable Medical Equipment         Start     Ordered   03/07/17 1837  DME Walker rolling  Once    Question:  Patient needs a walker to treat with the following condition  Answer:  S/P lumbar fusion   03/07/17 1836   03/07/17 1837  DME 3 n 1  Once     03/07/17 1836      Disposition: home   Final Dx: lumbar fusion  Discharge Instructions     Remove dressing in 72 hours    Complete by:  As directed    Call MD for:  difficulty breathing, headache or visual disturbances    Complete by:  As directed    Call MD for:  persistant nausea and vomiting    Complete by:  As directed    Call MD for:  redness, tenderness, or signs of infection (pain, swelling, redness, odor or green/yellow discharge around incision site)    Complete by:  As directed    Call MD for:  severe uncontrolled pain    Complete by:  As directed    Call MD for:  temperature >100.4    Complete by:  As directed    Diet - low sodium heart healthy    Complete by:  As directed    Increase activity slowly    Complete by:  As directed       Follow-up Information    JONES,DAVID S, MD. Schedule an appointment as soon as possible for a visit in 2 week(s).   Specialty:  Neurosurgery Contact information: [address] [place] [address] [phone]            Signed: Eustace Moore 03/08/2017, 8:02 AM

## 2017-03-08 NOTE — Evaluation (Signed)
Occupational Therapy Evaluation and Discharge Patient Details Name: Monique Miller MRN: [id number] DOB: [birth year] Today's Date: 03/08/2017    History of Present Illness Pt is a 66 y/o female s/p exploration of lumbar spine with removal of old hardware at L4-S1 and new bilateral PLIF at L4-S1. PMH including but not limited to CAD s/p NSTEMI in 2017, depression and previous L4-S1 sx in 2015.   Clinical Impression   PTA Pt independent in ADL/IADL and mobility. Pt currently at mod I level for ADL and mobility (pushing IV pole). OT reviewed adls maintaining precautions in detail. Pt educated on: set an alarm at night for medication, avoid sitting for long periods of time, correct bed positioning for sleeping, correct sequence for bed mobility, AE kit for LB bathing and dressing, avoiding lifting more than 5 pounds and never wash directly over incision. All education is complete and patient and husband indicate understanding. OT to sign off at this time. Thank you for this referral.      Follow Up Recommendations  No OT follow up;Supervision - Intermittent    Equipment Recommendations  None recommended by OT    Recommendations for Other Services       Precautions / Restrictions Precautions Precautions: Back Precaution Booklet Issued: No Precaution Comments: Pt able to recall 3/3 precautions after PT session Restrictions Weight Bearing Restrictions: No      Mobility Bed Mobility Overal bed mobility: Needs Assistance Bed Mobility: Rolling;Sit to Sidelying Rolling: Supervision Sidelying to sit: Supervision       General bed mobility comments: use of bed rails, HOB flat, good carryover of log roll from previous PT session  Transfers Overall transfer level: Needs assistance Equipment used: None Transfers: Sit to/from Stand Sit to Stand: Supervision         General transfer comment: increased time, supervision for safety    Balance Overall balance assessment: Needs  assistance Sitting-balance support: Feet supported Sitting balance-Leahy Scale: Good     Standing balance support: During functional activity;No upper extremity supported Standing balance-Leahy Scale: Fair Standing balance comment: pushed IV pole                           ADL either performed or assessed with clinical judgement   ADL Overall ADL's : Modified independent                                       General ADL Comments: Able to bring feet up in bed to doff socks, familiar with precautions and Pt able to recall compensatory strategies for oral care, prevent twisting etc.     Vision Baseline Vision/History: Wears glasses Wears Glasses: Reading only Patient Visual Report: No change from baseline Vision Assessment?: No apparent visual deficits     Perception     Praxis      Pertinent Vitals/Pain Pain Assessment: 0-10 Pain Score: 7  Pain Location: back Pain Descriptors / Indicators: Sore;Aching Pain Intervention(s): Repositioned;Monitored during session     Hand Dominance     Extremity/Trunk Assessment Upper Extremity Assessment Upper Extremity Assessment: Overall WFL for tasks assessed   Lower Extremity Assessment Lower Extremity Assessment: Defer to PT evaluation   Cervical / Trunk Assessment Cervical / Trunk Assessment: Other exceptions Cervical / Trunk Exceptions: pt s/p Lumbar sx   Communication Communication Communication: No difficulties   Cognition Arousal/Alertness: Awake/alert Behavior During Therapy: WFL for tasks  assessed/performed Overall Cognitive Status: Within Functional Limits for tasks assessed                                     General Comments       Exercises     Shoulder Instructions      Home Living Family/patient expects to be discharged to:: Private residence Living Arrangements: Spouse/significant other Available Help at Discharge: Family;Available 24 hours/day Type of Home:  House Home Access: Stairs to enter [employer] of Steps: 2 Entrance Stairs-Rails: None (door frame) Home Layout: Two level;Able to live on main level with bedroom/bathroom Alternate Level Stairs-Number of Steps: flight   Bathroom Shower/Tub: Hospital doctor Toilet: Handicapped height     Home Equipment: [place] - single point;Shower seat          Prior Functioning/Environment Level of Independence: Independent        Comments: retired, likes to travel to [place] [employer] and spend time with family; former Chief Technology Officer        OT Problem List: Pain      OT Treatment/Interventions:      OT Goals(Current goals can be found in the care plan section) Acute Rehab OT Goals Patient Stated Goal: be able to walk for her trip to [place] OT Goal Formulation: With patient/family Time For Goal Achievement: 03/22/17 Potential to Achieve Goals: Good  OT Frequency:     Barriers to D/C:            Co-evaluation              End of Session Equipment Utilized During Treatment: Gait belt Nurse Communication: Mobility status;Precautions  Activity Tolerance: Patient tolerated treatment well Patient left: in bed;with call bell/phone within reach;with family/visitor present  OT Visit Diagnosis: Unsteadiness on feet (R26.81);Pain Pain - part of body: Leg (back)                Time: 8592-7639 OT Time Calculation (min): 22 min Charges:  OT General Charges $OT Visit: 1 Procedure OT Evaluation $OT Eval Moderate Complexity: 1 Procedure G-Codes:     Hulda Humphrey OTR/L [place] 03/08/2017, 10:29 AM

## 2017-03-08 NOTE — Evaluation (Signed)
Physical Therapy Evaluation Patient Details Name: Monique Miller MRN: [id number] DOB: 02/24/1951 Today's Date: 03/08/2017   History of Present Illness  Pt is a 66 y/o female s/p exploration of lumbar spine with removal of old hardware at L4-S1 and new bilateral PLIF at L4-S1. PMH including but not limited to CAD s/p NSTEMI in 2017, depression and previous L4-S1 sx in 2015.  Clinical Impression  Pt presented supine in bed with HOB elevated, awake and willing to participate in therapy session. Prior to admission, pt reported that she was independent with all functional mobility and ADLs. Pt has had lumbar surgery in the past (2015) and was able to recall 2/3 back precautions. PT reviewed all precautions with pt and pt's husband. Pt ambulated in hallway while pushing IV pole with supervision. Pt also successfully completed stair training. No further acute PT needs identified at this time. PT signing off.     Follow Up Recommendations No PT follow up    Equipment Recommendations  None recommended by PT    Recommendations for Other Services       Precautions / Restrictions Precautions Precautions: Back Precaution Booklet Issued: No Precaution Comments: Pt able to recall 2/3 precautions from previous back surgery. PT reviewed all precautions with pt and her husband. Restrictions Weight Bearing Restrictions: No      Mobility  Bed Mobility Overal bed mobility: Needs Assistance Bed Mobility: Rolling;Sidelying to Sit Rolling: Supervision Sidelying to sit: Supervision       General bed mobility comments: increased time, use of bed rails, VC'ing for log roll technique  Transfers Overall transfer level: Needs assistance Equipment used: None Transfers: Sit to/from Stand Sit to Stand: Supervision         General transfer comment: increased time, supervision for safety  Ambulation/Gait Ambulation/Gait assistance: Supervision Ambulation Distance (Feet): 200 Feet Assistive device:  None (pushed IV pole) Gait Pattern/deviations: Step-through pattern;Decreased step length - right;Decreased step length - left;Decreased stride length Gait velocity: decreased Gait velocity interpretation: Below normal speed for age/gender General Gait Details: mild instability but no LOB or need for physical assistance, supervision for safety  Stairs Stairs: Yes Stairs assistance: Supervision Stair Management: Two rails;Step to pattern;Forwards Number of Stairs: 2 General stair comments: no instability or LOB  Wheelchair Mobility    Modified Rankin (Stroke Patients Only)       Balance Overall balance assessment: Needs assistance Sitting-balance support: Feet supported Sitting balance-Leahy Scale: Good     Standing balance support: During functional activity;No upper extremity supported Standing balance-Leahy Scale: Fair                               Pertinent Vitals/Pain Pain Assessment: 0-10 Pain Score: 7  Pain Location: back Pain Descriptors / Indicators: Sore;Aching Pain Intervention(s): Monitored during session;Repositioned    Home Living Family/patient expects to be discharged to:: Private residence Living Arrangements: Spouse/significant other Available Help at Discharge: Family;Available 24 hours/day Type of Home: House Home Access: Stairs to enter Entrance Stairs-Rails: None (door frame) Entrance Stairs-Number of Steps: 2 Home Layout: Two level;Able to live on main level with bedroom/bathroom Home Equipment: [place]ud[employer] - single point;Shower seat      Prior Function Level of Independence: Independent               Hand Dominance        Extremity/Trunk Assessment   Upper Extremity Assessment Upper Extremity Assessment: Defer to OT evaluation    Lower Extremity Assessment  Lower Extremity Assessment: Overall WFL for tasks assessed    Cervical / Trunk Assessment Cervical / Trunk Assessment: Other exceptions Cervical / Trunk  Exceptions: pt s/p Lumbar sx  Communication   Communication: No difficulties  Cognition Arousal/Alertness: Awake/alert Behavior During Therapy: WFL for tasks assessed/performed Overall Cognitive Status: Within Functional Limits for tasks assessed                                        General Comments      Exercises     Assessment/Plan    PT Assessment Patent does not need any further PT services  PT Problem List         PT Treatment Interventions      PT Goals (Current goals can be found in the Care Plan section)  Acute Rehab PT Goals Patient Stated Goal: return home today    Frequency     Barriers to discharge        Co-evaluation               End of Session Equipment Utilized During Treatment: Gait belt Activity Tolerance: Patient tolerated treatment well Patient left: Other (comment) (ambulating in hallway with OT) Nurse Communication: Mobility status PT Visit Diagnosis: Other abnormalities of gait and mobility (R26.89);Pain Pain - part of body:  (back)    Time: 2585-2778 PT Time Calculation (min) (ACUTE ONLY): 14 min   Charges:   PT Evaluation $PT Eval Moderate Complexity: 1 Procedure     PT G Codes:        Sherie Don, PT, DPT [place] 03/08/2017, 8:51 AM

## 2017-03-08 NOTE — Progress Notes (Signed)
Discharge instructions, RX's and follow up appts explained and provided to patient and family verbalized understanding. Patient left floor via wheelchair accompanied by volunteers no c/o pain or shortness of breath at d/c.  Marcel Gary, Tivis Ringer, RN

## 2017-03-08 NOTE — Progress Notes (Signed)
Patient walked approximately 15 yards in hallway with one assist and walker.  No complaints.  Continue to monitor. Patient foley removed per order. Ambulated to bathroom with very small void.

## 2017-04-01 ENCOUNTER — Emergency Department (HOSPITAL_COMMUNITY): Payer: BC Managed Care – PPO

## 2017-04-01 ENCOUNTER — Encounter (HOSPITAL_COMMUNITY): Payer: Self-pay | Admitting: Emergency Medicine

## 2017-04-01 ENCOUNTER — Emergency Department (HOSPITAL_COMMUNITY)
Admission: EM | Admit: 2017-04-01 | Discharge: 2017-04-01 | Disposition: A | Discharge status: Home / Self Care | Payer: BC Managed Care – PPO | Attending: Emergency Medicine | Admitting: Emergency Medicine

## 2017-04-01 DIAGNOSIS — I2510 Atherosclerotic heart disease of native coronary artery without angina pectoris: Secondary | ICD-10-CM | POA: Diagnosis not present

## 2017-04-01 DIAGNOSIS — Z85828 Personal history of other malignant neoplasm of skin: Secondary | ICD-10-CM | POA: Insufficient documentation

## 2017-04-01 DIAGNOSIS — I252 Old myocardial infarction: Secondary | ICD-10-CM | POA: Diagnosis not present

## 2017-04-01 DIAGNOSIS — Z7982 Long term (current) use of aspirin: Secondary | ICD-10-CM | POA: Diagnosis not present

## 2017-04-01 DIAGNOSIS — R55 Syncope and collapse: Secondary | ICD-10-CM | POA: Insufficient documentation

## 2017-04-01 DIAGNOSIS — Z79899 Other long term (current) drug therapy: Secondary | ICD-10-CM | POA: Insufficient documentation

## 2017-04-01 LAB — CBC WITH DIFFERENTIAL/PLATELET
BASOS ABS: 0.1 10*3/uL (ref 0.0–0.1)
Basophils Relative: 2 %
Eosinophils Absolute: 0.4 10*3/uL (ref 0.0–0.7)
Eosinophils Relative: 7 %
HEMATOCRIT: 40.4 % (ref 36.0–46.0)
HEMOGLOBIN: 13.3 g/dL (ref 12.0–15.0)
LYMPHS PCT: 40 %
Lymphs Abs: 2.2 10*3/uL (ref 0.7–4.0)
MCH: 30.4 pg (ref 26.0–34.0)
MCHC: 32.9 g/dL (ref 30.0–36.0)
MCV: 92.4 fL (ref 78.0–100.0)
Monocytes Absolute: 0.8 10*3/uL (ref 0.1–1.0)
Monocytes Relative: 14 %
Neutro Abs: 2 10*3/uL (ref 1.7–7.7)
Neutrophils Relative %: 37 %
PLATELETS: 189 10*3/uL (ref 150–400)
RBC: 4.37 MIL/uL (ref 3.87–5.11)
RDW: 12.8 % (ref 11.5–15.5)
WBC: 5.4 10*3/uL (ref 4.0–10.5)

## 2017-04-01 LAB — URINALYSIS, ROUTINE W REFLEX MICROSCOPIC
Bilirubin Urine: NEGATIVE
GLUCOSE, UA: NEGATIVE mg/dL
Hgb urine dipstick: NEGATIVE
KETONES UR: 5 mg/dL — AB
NITRITE: NEGATIVE
PH: 6 (ref 5.0–8.0)
Protein, ur: NEGATIVE mg/dL
Specific Gravity, Urine: 1.027 (ref 1.005–1.030)

## 2017-04-01 LAB — I-STAT TROPONIN, ED
Troponin i, poc: 0 ng/mL (ref 0.00–0.08)
Troponin i, poc: 0 ng/mL (ref 0.00–0.08)

## 2017-04-01 LAB — COMPREHENSIVE METABOLIC PANEL
ALK PHOS: 72 U/L (ref 38–126)
ALT: 40 U/L (ref 14–54)
AST: 45 U/L — AB (ref 15–41)
Albumin: 4 g/dL (ref 3.5–5.0)
Anion gap: 9 (ref 5–15)
BUN: 11 mg/dL (ref 6–20)
CALCIUM: 9.2 mg/dL (ref 8.9–10.3)
CHLORIDE: 104 mmol/L (ref 101–111)
CO2: 26 mmol/L (ref 22–32)
CREATININE: 0.85 mg/dL (ref 0.44–1.00)
Glucose, Bld: 129 mg/dL — ABNORMAL HIGH (ref 65–99)
Potassium: 4.3 mmol/L (ref 3.5–5.1)
Sodium: 139 mmol/L (ref 135–145)
Total Bilirubin: 0.8 mg/dL (ref 0.3–1.2)
Total Protein: 6.7 g/dL (ref 6.5–8.1)

## 2017-04-01 MED ORDER — SODIUM CHLORIDE 0.9 % IV BOLUS (SEPSIS)
1000.0000 mL | Freq: Once | INTRAVENOUS | Status: AC
  Administered 2017-04-01: 1000 mL via INTRAVENOUS

## 2017-04-01 NOTE — Discharge Instructions (Signed)
Return to ED if symptoms worsen

## 2017-04-01 NOTE — ED Triage Notes (Signed)
Pt st's she had a syncopal episode this am after getting out of bed.,  St's she has not felt good all day since the episode.  Pt had back surg last month and has recently started taking her blood thinners again.  Pt did hit her head when she fell

## 2017-04-01 NOTE — ED Provider Notes (Signed)
[facility] DEPT Provider Note   CSN: [id number] Arrival date & time: 04/01/17  1513     History   Chief Complaint Chief Complaint  Patient presents with  . Loss of Consciousness    HPI Monique Miller is a 66 y.o. female.  The history is provided by the patient.  Loss of Consciousness   This is a new problem. The current episode started 6 to 12 hours ago. The problem has been resolved. She lost consciousness for a period of less than one minute. The problem is associated with standing up (Patient got up after lying down and felt dizzines and when she took a few steps she had syncopal event hitting her head. No seizure like activity and was at baseline shortly after. Patient is on blood thinners. ). Associated symptoms include dizziness. Pertinent negatives include abdominal pain, back pain, bladder incontinence, bowel incontinence, chest pain, clumsiness, confusion, congestion, diaphoresis, fever, focal sensory loss, focal weakness, headaches, light-headedness, nausea, palpitations, seizures, slurred speech, vertigo, visual change, vomiting and weakness. She has tried bed rest for the symptoms. Her past medical history is significant for CAD.    Past Medical History:  Diagnosis Date  . Allergy   . Anemia    "many years ago"  . Anxiety   . Arthritis   . Cancer ([place])    skin cancer (basal cell)  . Constipation by delayed colonic transit   . Coronary artery disease   . Depression    see Meridith Child psychotherapist and Trey Paula  . Dyspnea    occ  . Grover's disease   . Headache   . Hx of migraines   . NSTEMI (non-ST elevated myocardial infarction) ([place]) 02/2016  . Pneumonia    walking pneumonia  . Restless leg syndrome   . Thyroid disease    hx of hypothyroid    Patient Active Problem List   Diagnosis Date Noted  . S/P lumbar spinal fusion 03/07/2017  . Osteopenia 03/14/2016  . Post PTCA 03/08/2016  . CAD (coronary artery disease), native coronary artery 03/05/2016  . Spinal  stenosis of lumbar region at multiple levels 08/06/2014  . Anxiety 10/02/2012  . Depression 10/02/2012  . Menopausal disorder 10/02/2012  . Attention deficit disorder 10/02/2012    Past Surgical History:  Procedure Laterality Date  . APPENDECTOMY    . BACK SURGERY     L4-L5  . BREAST SURGERY     augmentation  . CARDIAC CATHETERIZATION N/A 03/08/2016   Procedure: Left Heart Cath and Coronary Angiography;  Surgeon: Adrian Prows, MD;  Location: [facility];  Service: Cardiovascular;  Laterality: N/A;  . CARDIAC CATHETERIZATION N/A 03/08/2016   Procedure: Coronary Stent Intervention;  Surgeon: Adrian Prows, MD;  Location: [facility];  Service: Cardiovascular;  Laterality: N/A;  Resolute 3.5x15  . CARDIAC CATHETERIZATION N/A 03/08/2016   Procedure: Intravascular Ultrasound/IVUS;  Surgeon: Adrian Prows, MD;  Location: [facility];  Service: Cardiovascular;  Laterality: N/A;  RCA MID  . CESAREAN SECTION     x2  . COLONOSCOPY    . CORONARY ANGIOGRAM  03/08/2016  . CORONARY ANGIOPLASTY     DES LAD, DES D1 02/16/16 [facility], [place] DES RCA 03/08/16 [facility])  . LAMINECTOMY WITH POSTERIOR LATERAL ARTHRODESIS LEVEL 2 N/A 03/07/2017   Procedure: Lumbar Four-Five, Lumbar Five-Sacral One Posterior Lateral Fusion with Removal of pedicle screws;  Surgeon: Eustace Moore, MD;  Location: [facility];  Service: Neurosurgery;  Laterality: N/A;    OB History  No data available       Home Medications    Prior to Admission medications   Medication Sig Start Date End Date Taking? Authorizing Provider  acetaminophen (TYLENOL) 325 MG tablet Take 325-650 mg by mouth every 6 (six) hours as needed (for pain).   Yes [provider]  ALPRAZolam Duanne Moron) 1 MG tablet Take 1-2 mg by mouth at bedtime.  10/11/11  Yes [provider]  aspirin 81 MG tablet Take 81 mg by mouth daily.    Yes [provider]  BRILINTA 90 MG TABS tablet Take 90 mg by mouth 2 (two) times daily.  02/17/16  Yes  [provider]  docusate sodium (COLACE) 100 MG capsule Take 100 mg by mouth at bedtime as needed for mild constipation.    Yes [provider]  DULoxetine (CYMBALTA) 60 MG capsule Take 120 mg by mouth every morning.    Yes [provider]  fexofenadine (ALLEGRA) 180 MG tablet Take 180 mg by mouth daily as needed for allergies.    Yes [provider]  methocarbamol (ROBAXIN) 500 MG tablet Take 1 tablet (500 mg total) by mouth every 6 (six) hours as needed for muscle spasms. 03/08/17  Yes Eustace Moore, MD  metoprolol succinate (TOPROL-XL) 25 MG 24 hr tablet Take 25 mg by mouth daily.   Yes [provider]  niacin (NIASPAN) 1000 MG CR tablet Take 1,000 mg by mouth at bedtime. 02/18/17  Yes [provider]  nitroGLYCERIN (NITROSTAT) 0.4 MG SL tablet Place 0.4 mg under the tongue every 5 (five) minutes as needed for chest pain.  02/17/16  Yes [provider]  Probiotic Product (PROBIOTIC DAILY PO) Take 1 capsule by mouth daily.   Yes [provider]  rosuvastatin (CRESTOR) 20 MG tablet Take 20 mg by mouth daily.   Yes [provider]  oxyCODONE (OXY IR/ROXICODONE) 5 MG immediate release tablet Take 1 tablet (5 mg total) by mouth every 4 (four) hours as needed for breakthrough pain. Patient not taking: Reported on 04/01/2017 03/08/17   Eustace Moore, MD    Family History Family History  Problem Relation Age of Onset  . Cancer Mother        pancreatic  . Cancer Maternal Uncle        pancreatic  . Cancer Maternal Grandmother        pancreatic    Social History Social History  Substance Use Topics  . Smoking status: Never Smoker  . Smokeless tobacco: Never Used  . Alcohol use 4.2 oz/week    7 Glasses of wine per week     Comment: occassionally     Allergies   No known allergies   Review of Systems Review of Systems  Constitutional: Negative for chills, diaphoresis and fever.  HENT: Negative for  congestion, ear pain and sore throat.   Eyes: Negative for pain and visual disturbance.  Respiratory: Negative for cough and shortness of breath.   Cardiovascular: Positive for syncope. Negative for chest pain and palpitations.  Gastrointestinal: Negative for abdominal pain, bowel incontinence, nausea and vomiting.  Genitourinary: Negative for bladder incontinence, dysuria and hematuria.  Musculoskeletal: Negative for arthralgias and back pain.  Skin: Negative for color change and rash.  Neurological: Positive for dizziness and syncope. Negative for vertigo, focal weakness, seizures, weakness, light-headedness and headaches.  Psychiatric/Behavioral: Negative for confusion.  All other systems reviewed and are negative.    Physical Exam Updated Vital Signs  ED Triage Vitals  Enc Vitals Group     BP 04/01/17 1616 (!) 152/93     Pulse Rate 04/01/17 1616 72     Resp 04/01/17 1616 18     Temp 04/01/17 1616 97.8 F (36.6 C)     Temp Source 04/01/17 1616 Oral     SpO2 04/01/17 1616 100 %     Weight 04/01/17 1617 150 lb (68 kg)     Height 04/01/17 1617 5\' 5"  (1.651 m)     Head Circumference --      Peak Flow --      Pain Score 04/01/17 1613 3     Pain Loc --      Pain Edu? --      Excl. in [place]? --     Physical Exam  Constitutional: She is oriented to person, place, and time. She appears well-developed and well-nourished. No distress.  HENT:  Head: Normocephalic and atraumatic.  Eyes: Conjunctivae and EOM are normal. Pupils are equal, round, and reactive to light.  Neck: Normal range of motion. Neck supple.  Cardiovascular: Normal rate, regular rhythm, normal heart sounds and intact distal pulses.   No murmur heard. Pulmonary/Chest: Effort normal and breath sounds normal. No respiratory distress. She has no wheezes. She has no rales.  Abdominal: Soft. She exhibits no distension. There is no tenderness.  Musculoskeletal: Normal range of motion. She exhibits no edema.  Neurological:  She is alert and oriented to person, place, and time. She displays normal reflexes. No cranial nerve deficit or sensory deficit. She exhibits normal muscle tone. Coordination normal.  5+/5 strength, normal sensation, no drift, normal finger to nose finger  Skin: Skin is warm and dry. Capillary refill takes less than 2 seconds.  Psychiatric: She has a normal mood and affect.  Nursing note and vitals reviewed.    ED Treatments / Results  Labs (all labs ordered are listed, but only abnormal results are displayed) Labs Reviewed  COMPREHENSIVE METABOLIC PANEL - Abnormal; Notable for the following:       Result Value   Glucose, Bld 129 (*)    AST 45 (*)    All other components within normal limits  URINALYSIS, ROUTINE W REFLEX MICROSCOPIC - Abnormal; Notable for the following:    APPearance HAZY (*)    Ketones, ur 5 (*)    Leukocytes, UA LARGE (*)    Bacteria, UA RARE (*)    Squamous Epithelial / LPF 6-30 (*)    Non Squamous Epithelial 6-30 (*)    All other components within normal limits  CBC WITH DIFFERENTIAL/PLATELET  I-STAT TROPOININ, ED  I-STAT TROPOININ, ED    EKG  EKG Interpretation  Date/Time:  Monday Apr 01 2017 16:29:44 EDT Ventricular Rate:  71 PR Interval:  132 QRS Duration: 70 QT Interval:  414 QTC Calculation: 449 R Axis:   65 Text Interpretation:  Normal sinus rhythm Septal infarct , age undetermined Abnormal ECG When compared to prior, no significnt changes seen. No STEMI Confirmed by Antony Blackbird [phone]) on 04/01/2017 7:02:28 PM       Radiology Dg Chest 2 View  Result Date: 04/01/2017 CLINICAL DATA:  Syncope EXAM: CHEST  2 VIEW COMPARISON:  02/27/2017 FINDINGS: Slight elevation of the left diaphragm. No focal consolidation or effusion. Normal cardiomediastinal silhouette. No pneumothorax. Gas-filled bowel with large amount of stool in the upper abdomen. IMPRESSION: No radiographic evidence for acute cardiopulmonary abnormality. Electronically Signed   By:  Donavan Foil M.D.   On: 04/01/2017 22:34  Ct Head Wo Contrast  Result Date: 04/01/2017 CLINICAL DATA:  Syncopal episode EXAM: CT HEAD WITHOUT CONTRAST TECHNIQUE: Contiguous axial images were obtained from the base of the skull through the vertex without intravenous contrast. COMPARISON:  None. FINDINGS: Brain: Mild atrophic changes are noted. Prior lacunar infarct is noted within the basal ganglia on the left. No acute hemorrhage, acute infarction or space-occupying mass lesion is seen. Vascular: No hyperdense vessel or unexpected calcification. Skull: Normal. Negative for fracture or focal lesion. Sinuses/Orbits: No acute finding. Other: None. IMPRESSION: Chronic atrophic and ischemic changes without acute abnormality. Electronically Signed   By: Inez Catalina M.D.   On: 04/01/2017 17:30    Procedures Procedures (including critical care time)  Medications Ordered in ED Medications  sodium chloride 0.9 % bolus 1,000 mL (0 mLs Intravenous Stopped 04/01/17 2124)     Initial Impression / Assessment and Plan / ED Course  I have reviewed the triage vital signs and the nursing notes.  Pertinent labs & imaging results that were available during my care of the patient were reviewed by me and considered in my medical decision making (see chart for details).     Monique Miller is a 66 year old female with history of coronary artery disease status post stents, anxiety, anemia who presents to the ED following syncopal event. Patient's vitals at time of arrival to the ED are unremarkable and patient is without fever. Patient states that this morning when she got up out of bed she felt dizzy and after taking several steps fell to the floor hitting her head. Husband states that he witnessed event and denies any seizure-like activity. Loss of consciousness was less than a minute and patient was immediately back to her baseline. Patient does not have chest pain, shortness of breath, diaphoresis prior to event.  Patient denies any dizziness at this time. Patient states that she has been feeling well and denies fever, chills, cough. Patient has no history of arrhythmia. Patient is on blood thinners. Exam is overall unremarkable including neurological exam. No obvious murmur. No leg swelling. No DVT or PE risk factors, doutb PE. Patient evaluated with head CT, chest x-ray, EKG, CBC, CMP, serial troponins, urinalysis. Patient given normal saline bolus. Patient likely with orthostatic event vs arrhythmia.  EKG shows normal sinus rhythm with no signs of ischemia, intervals within normal limits. Serial troponins undetectable 2 and doubt ACS. Patient with chest x-ray with no signs of pneumonia, pleural effusion, pneumothorax and overall unremarkable. No significant anemia or electrolyte abnormalities. Kidney function within normal limits. Urinalysis with no signs of urinary tract infection. Head CT with no acute abnormalities. Patient asymptomatic upon reevaluation. Orthostatic vitals were within normal limits. Discussed need for possible admission for syncope and after discussion patient would like discharge to home and follow-up w/ her primary care provider. Patient understands risks and benefits about staying versus leaving. Concern for possible arrhythmia and recommend that she follow-up for possible heart monitor. Told to return to ED if symptoms worsen. Patient discharged in good condition.  Final Clinical Impressions(s) / ED Diagnoses   Final diagnoses:  Syncope and collapse    New Prescriptions Discharge Medication List as of 04/01/2017 11:29 PM       Lennice Sites, DO 04/02/17 0117    Tegeler, Gwenyth Allegra, MD 04/09/17 1436

## 2017-04-01 NOTE — ED Notes (Signed)
Pt able to drink per Dr. Ronnald Nian. Pt and family given soda.

## 2017-04-03 ENCOUNTER — Telehealth: Payer: Self-pay | Admitting: Adult Health

## 2017-04-03 NOTE — Telephone Encounter (Signed)
Patient Name: Monique Miller  DOB: [birth year]    Initial Comment Caller states her lab work from the ER showed she has a UTI but she wasn't given anything to treat it. Caller denies any symptoms at time of call.    Nurse Assessment  Nurse: Erling Cruz, RN, Morey Hummingbird Date/Time (Eastern Time): 04/03/2017 11:34:46 AM  Confirm and document reason for call. If symptomatic, describe symptoms. ---Caller states her lab work from the ER (Monday) showed she has a UTI but she wasn't given anything to treat it. Caller denies any symptoms at time of call.  Does the patient have any new or worsening symptoms? ---Yes  Will a triage be completed? ---Yes  Related visit to physician within the last 2 weeks? ---Yes  Does the PT have any chronic conditions? (i.e. diabetes, asthma, etc.) ---No  Is this a behavioral health or substance abuse call? ---No     Guidelines    Guideline Title Affirmed Question Affirmed Notes  Urination Pain - Female Age > 62 years    Final Disposition User   See Physician within 24 Hours Love, RN, [employer] with office & they will contact patient back after speaking with PCP about if they can prescribe Abx or needs to be seen. Caller verbalized understanding.   Referrals  REFERRED TO PCP OFFICE   Disagree/Comply: Comply

## 2017-04-03 NOTE — Telephone Encounter (Signed)
Spoke to patient who continues to deny any UTI symptoms (buring, frequency, pain, fever, foul odor, discoloration), patient reports the ER didn't mention any abnormalities in urine, however her cardiologist did suggest she seek treatment from PCP d/t UA results, reviewed UA results with Dr. Elease Hashimoto, MD states that if patient is non symptomatic, no need for treatment, but if is or becomes symptomatic, patient needs OV. Advised patient and patient requests her PCP review results when he returns to the office and follow up with patient, patient voiced understanding to other instructions provided.

## 2017-04-04 NOTE — Telephone Encounter (Signed)
Spoke with pt and advised. She will contact office if develops any s/s. Nothing further needed at this time.

## 2017-04-04 NOTE — Telephone Encounter (Signed)
I agree with Dr. Elease Hashimoto.   Drink plenty of water and follow up with any symptoms of a UTI

## 2017-07-04 NOTE — Addendum Note (Signed)
Addendum  created 07/04/17 1338 by Roberts Gaudy, MD   Sign clinical note

## 2017-08-02 ENCOUNTER — Encounter: Payer: Self-pay | Admitting: Adult Health

## 2017-08-29 ENCOUNTER — Encounter: Payer: Self-pay | Admitting: Adult Health

## 2017-08-29 ENCOUNTER — Ambulatory Visit (INDEPENDENT_AMBULATORY_CARE_PROVIDER_SITE_OTHER): Payer: BC Managed Care – PPO | Admitting: Adult Health

## 2017-08-29 VITALS — BP 120/66 | Temp 98.8°F | Wt 150.0 lb

## 2017-08-29 DIAGNOSIS — Z23 Encounter for immunization: Secondary | ICD-10-CM | POA: Diagnosis not present

## 2017-08-29 DIAGNOSIS — N3001 Acute cystitis with hematuria: Secondary | ICD-10-CM

## 2017-08-29 LAB — POC URINALSYSI DIPSTICK (AUTOMATED)
Bilirubin, UA: 1.025
Blood, UA: 3
COLOR UA: NEGATIVE
Clarity, UA: NEGATIVE
Glucose, UA: 5
Ketones, UA: 3
NITRITE UA: POSITIVE
PH UA: 6 (ref 5.0–8.0)
Protein, UA: 1
SPEC GRAV UA: 1.025 (ref 1.010–1.025)
UROBILINOGEN UA: 0.2 U/dL

## 2017-08-29 MED ORDER — CIPROFLOXACIN HCL 500 MG PO TABS: 500.0000 mg | ORAL_TABLET | Freq: Two times a day (BID) | ORAL | 0 refills | Status: DC

## 2017-08-29 NOTE — Progress Notes (Signed)
NWG:NFAOZHYQ, Tommi Rumps, NP Chief Complaint  Patient presents with  . Urinary Frequency  . Urinary Urgency  . Back Pain   Current Issues:  Presents with 3 days of dysuria, urinary urgency and urinary frequency Associated symptoms include:  cloudy urine, dysuria, flank pain bilaterally, urinary frequency and urinary urgency  There is a previous history of of similar symptoms. Sexually active:  Yes with female.   No concern for STI.  Prior to Admission medications   Medication Sig Start Date End Date Taking? Authorizing Provider  ALPRAZolam Duanne Moron) 1 MG tablet Take 1-2 mg by mouth at bedtime.  10/11/11   [provider]  aspirin 81 MG tablet Take 81 mg by mouth daily.     [provider]  BRILINTA 90 MG TABS tablet Take 90 mg by mouth 2 (two) times daily.  02/17/16   [provider]  DULoxetine (CYMBALTA) 60 MG capsule Take 120 mg by mouth every morning.     [provider]  fexofenadine (ALLEGRA) 180 MG tablet Take 180 mg by mouth daily as needed for allergies.     [provider]  metoprolol succinate (TOPROL-XL) 25 MG 24 hr tablet Take 25 mg by mouth daily.    [provider]  niacin (NIASPAN) 1000 MG CR tablet Take 1,000 mg by mouth at bedtime. 02/18/17   [provider]  oxyCODONE (OXY IR/ROXICODONE) 5 MG immediate release tablet Take 1 tablet (5 mg total) by mouth every 4 (four) hours as needed for breakthrough pain. Patient not taking: Reported on 04/01/2017 03/08/17   Eustace Moore, MD  Probiotic Product (PROBIOTIC DAILY PO) Take 1 capsule by mouth daily.    [provider]  rosuvastatin (CRESTOR) 20 MG tablet Take 20 mg by mouth daily.    [provider]    Review of Systems: See HPI   PE:  Temp 98.8 F (37.1 C) (Oral)   Wt 150 lb (68 kg)   BMI 24.96 kg/m  Constitutional" Alert and oriented. Does not appear in any acute distress Heart: Normal Rate and Normal Rhythm  Lungs: Clear to Auscultation  Back:  Bilateral CVA tenderness and flank pain  Abdomen: Discomfort with palpation in all quadrants    Results for orders placed or performed during the hospital encounter of 04/01/17  CBC with Differential  Result Value Ref Range   WBC 5.4 4.0 - 10.5 K/uL   RBC 4.37 3.87 - 5.11 MIL/uL   Hemoglobin 13.3 12.0 - 15.0 g/dL   HCT 40.4 36.0 - 46.0 %   MCV 92.4 78.0 - 100.0 fL   MCH 30.4 26.0 - 34.0 pg   MCHC 32.9 30.0 - 36.0 g/dL   RDW 12.8 11.5 - 15.5 %   Platelets 189 150 - 400 K/uL   Neutrophils Relative % 37 %   Neutro Abs 2.0 1.7 - 7.7 K/uL   Lymphocytes Relative 40 %   Lymphs Abs 2.2 0.7 - 4.0 K/uL   Monocytes Relative 14 %   Monocytes Absolute 0.8 0.1 - 1.0 K/uL   Eosinophils Relative 7 %   Eosinophils Absolute 0.4 0.0 - 0.7 K/uL   Basophils Relative 2 %   Basophils Absolute 0.1 0.0 - 0.1 K/uL  Comprehensive metabolic panel  Result Value Ref Range   Sodium 139 135 - 145 mmol/L   Potassium 4.3 3.5 - 5.1 mmol/L   Chloride 104 101 - 111 mmol/L   CO2 26 22 - 32 mmol/L   Glucose, Bld 129 (H) 65 -  99 mg/dL   BUN 11 6 - 20 mg/dL   Creatinine, Ser 0.85 0.44 - 1.00 mg/dL   Calcium 9.2 8.9 - 10.3 mg/dL   Total Protein 6.7 6.5 - 8.1 g/dL   Albumin 4.0 3.5 - 5.0 g/dL   AST 45 (H) 15 - 41 U/L   ALT 40 14 - 54 U/L   Alkaline Phosphatase 72 38 - 126 U/L   Total Bilirubin 0.8 0.3 - 1.2 mg/dL   GFR calc non Af Amer >60 >60 mL/min   GFR calc Af Amer >60 >60 mL/min   Anion gap 9 5 - 15  Urinalysis, Routine w reflex microscopic  Result Value Ref Range   Color, Urine YELLOW YELLOW   APPearance HAZY (A) CLEAR   Specific Gravity, Urine 1.027 1.005 - 1.030   pH 6.0 5.0 - 8.0   Glucose, UA NEGATIVE NEGATIVE mg/dL   Hgb urine dipstick NEGATIVE NEGATIVE   Bilirubin Urine NEGATIVE NEGATIVE   Ketones, ur 5 (A) NEGATIVE mg/dL   Protein, ur NEGATIVE NEGATIVE mg/dL   Nitrite NEGATIVE NEGATIVE   Leukocytes, UA LARGE (A) NEGATIVE   RBC / HPF 0-5 0 - 5 RBC/hpf   WBC, UA TOO NUMEROUS TO COUNT 0 - 5  WBC/hpf   Bacteria, UA RARE (A) NONE SEEN   Squamous Epithelial / LPF 6-30 (A) NONE SEEN   Mucus PRESENT    Non Squamous Epithelial 6-30 (A) NONE SEEN  I-Stat Troponin, ED (not at [facility])  Result Value Ref Range   Troponin i, poc 0.00 0.00 - 0.08 ng/mL   Comment 3          I-Stat Troponin, ED (not at [facility])  Result Value Ref Range   Troponin i, poc 0.00 0.00 - 0.08 ng/mL   Comment 3            Assessment and Plan:  1. Acute cystitis with hematuria  - POCT Urinalysis Dipstick (Automated) + blood, leuks, nitrates, and protein. Will treat with cipro and get urine culture  - Urine culture - ciprofloxacin (CIPRO) 500 MG tablet; Take 1 tablet (500 mg total) by mouth 2 (two) times daily.  Dispense: 10 tablet; Refill: 0  2. Need for influenza vaccination - Flu shot given   [name], NP

## 2017-09-01 LAB — URINE CULTURE
MICRO NUMBER:: 81165434
SPECIMEN QUALITY:: ADEQUATE

## 2017-10-14 ENCOUNTER — Other Ambulatory Visit: Payer: Self-pay | Admitting: Adult Health

## 2017-10-14 DIAGNOSIS — Z139 Encounter for screening, unspecified: Secondary | ICD-10-CM

## 2017-10-29 ENCOUNTER — Ambulatory Visit (INDEPENDENT_AMBULATORY_CARE_PROVIDER_SITE_OTHER): Payer: BC Managed Care – PPO | Admitting: Psychology

## 2017-10-29 DIAGNOSIS — F330 Major depressive disorder, recurrent, mild: Secondary | ICD-10-CM | POA: Diagnosis not present

## 2017-11-01 ENCOUNTER — Other Ambulatory Visit: Payer: Self-pay | Admitting: Neurological Surgery

## 2017-11-01 DIAGNOSIS — M4316 Spondylolisthesis, lumbar region: Secondary | ICD-10-CM

## 2017-11-15 ENCOUNTER — Ambulatory Visit
Admission: RE | Admit: 2017-11-15 | Discharge: 2017-11-15 | Disposition: A | Discharge status: Home / Self Care | Payer: BC Managed Care – PPO | Source: Ambulatory Visit | Attending: Neurological Surgery | Admitting: Neurological Surgery

## 2017-11-15 ENCOUNTER — Ambulatory Visit
Admission: RE | Admit: 2017-11-15 | Discharge: 2017-11-15 | Disposition: A | Discharge status: Home / Self Care | Payer: BC Managed Care – PPO | Source: Ambulatory Visit | Attending: Adult Health | Admitting: Adult Health

## 2017-11-15 DIAGNOSIS — M4316 Spondylolisthesis, lumbar region: Secondary | ICD-10-CM

## 2017-11-15 DIAGNOSIS — Z139 Encounter for screening, unspecified: Secondary | ICD-10-CM

## 2017-12-10 ENCOUNTER — Ambulatory Visit (INDEPENDENT_AMBULATORY_CARE_PROVIDER_SITE_OTHER): Payer: BC Managed Care – PPO | Admitting: Psychology

## 2017-12-10 DIAGNOSIS — F331 Major depressive disorder, recurrent, moderate: Secondary | ICD-10-CM | POA: Diagnosis not present

## 2018-01-16 ENCOUNTER — Ambulatory Visit (INDEPENDENT_AMBULATORY_CARE_PROVIDER_SITE_OTHER): Payer: BC Managed Care – PPO | Admitting: Psychology

## 2018-01-16 DIAGNOSIS — F331 Major depressive disorder, recurrent, moderate: Secondary | ICD-10-CM | POA: Diagnosis not present

## 2018-02-05 ENCOUNTER — Ambulatory Visit (INDEPENDENT_AMBULATORY_CARE_PROVIDER_SITE_OTHER): Payer: BC Managed Care – PPO | Admitting: Psychology

## 2018-02-05 DIAGNOSIS — F331 Major depressive disorder, recurrent, moderate: Secondary | ICD-10-CM | POA: Diagnosis not present

## 2018-02-14 ENCOUNTER — Ambulatory Visit: Payer: BC Managed Care – PPO | Admitting: Psychology

## 2018-02-20 ENCOUNTER — Encounter: Payer: Self-pay | Admitting: Adult Health

## 2018-02-20 ENCOUNTER — Ambulatory Visit: Payer: BC Managed Care – PPO | Admitting: Adult Health

## 2018-02-20 VITALS — BP 114/68 | Temp 97.9°F | Wt 149.0 lb

## 2018-02-20 DIAGNOSIS — R300 Dysuria: Secondary | ICD-10-CM | POA: Diagnosis not present

## 2018-02-20 LAB — POC URINALSYSI DIPSTICK (AUTOMATED)
Bilirubin, UA: NEGATIVE
Glucose, UA: NEGATIVE
Ketones, UA: NEGATIVE
LEUKOCYTES UA: NEGATIVE
NITRITE UA: NEGATIVE
PH UA: 7.5 (ref 5.0–8.0)
PROTEIN UA: NEGATIVE
Spec Grav, UA: 1.015 (ref 1.010–1.025)
UROBILINOGEN UA: 0.2 U/dL

## 2018-02-20 MED ORDER — PHENAZOPYRIDINE HCL 100 MG PO TABS: 100.0000 mg | ORAL_TABLET | Freq: Three times a day (TID) | ORAL | 0 refills | Status: DC | PRN

## 2018-02-20 NOTE — Progress Notes (Signed)
Subjective:    Patient ID: Monique Miller, female    DOB: 1951-10-04, 67 y.o.   MRN: [id number]  HPI  67 year old female who  has a past medical history of Allergy, Anemia, Anxiety, Arthritis, Cancer ([place]), Constipation by delayed colonic transit, Coronary artery disease, Depression, Dyspnea, Grover's disease, Headache, migraines, NSTEMI (non-ST elevated myocardial infarction) ([place]) (02/2016), Pneumonia, Restless leg syndrome, and Thyroid disease.  She presents to the office today for acute urinary complaints. She complains of urinary frequency, hematuria, pelvic pain, and dysuria. Her symptoms started two days ago. Denies fevers, chills, or feeling ill. She has not had any abnormal vaginal discharge or odor.   Review of Systems See HPI   Past Medical History:  Diagnosis Date  . Allergy   . Anemia    "many years ago"  . Anxiety   . Arthritis   . Cancer ([place])    skin cancer (basal cell)  . Constipation by delayed colonic transit   . Coronary artery disease   . Depression    see [facility] psychotherapist and Trey Paula  . Dyspnea    occ  . Grover's disease   . Headache   . Hx of migraines   . NSTEMI (non-ST elevated myocardial infarction) ([place]) 02/2016  . Pneumonia    walking pneumonia  . Restless leg syndrome   . Thyroid disease    hx of hypothyroid    Social History   Socioeconomic History  . Marital status: Married    Spouse name: Not on file  . Number of children: Not on file  . Years of education: Not on file  . Highest education level: Not on file  Occupational History  . Not on file  Social Needs  . Financial resource strain: Not on file  . Food insecurity:    Worry: Not on file    Inability: Not on file  . Transportation needs:    Medical: Not on file    Non-medical: Not on file  Tobacco Use  . Smoking status: Never Smoker  . Smokeless tobacco: Never Used  Substance and Sexual Activity  . Alcohol use: Yes    Alcohol/week: 4.2 oz    Types: 7 Glasses of wine per  week    Comment: occassionally  . Drug use: No  . Sexual activity: Not on file  Lifestyle  . Physical activity:    Days per week: Not on file    Minutes per session: Not on file  . Stress: Not on file  Relationships  . Social connections:    Talks on phone: Not on file    Gets together: Not on file    Attends religious service: Not on file    Active member of club or organization: Not on file    Attends meetings of clubs or organizations: Not on file    Relationship status: Not on file  . Intimate partner violence:    Fear of current or ex partner: Not on file    Emotionally abused: Not on file    Physically abused: Not on file    Forced sexual activity: Not on file  Other Topics Concern  . 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     Past Surgical History:  Procedure Laterality Date  . APPENDECTOMY    . AUGMENTATION MAMMAPLASTY Bilateral   . BACK SURGERY     L4-L5  . BREAST SURGERY     augmentation  . CARDIAC CATHETERIZATION N/A 03/08/2016   Procedure: Left Heart Cath and Coronary Angiography;  Surgeon: Adrian Prows, MD;  Location: [facility];  Service: Cardiovascular;  Laterality: N/A;  . CARDIAC CATHETERIZATION N/A 03/08/2016   Procedure: Coronary Stent Intervention;  Surgeon: Adrian Prows, MD;  Location: [facility];  Service: Cardiovascular;  Laterality: N/A;  Resolute 3.5x15  . CARDIAC CATHETERIZATION N/A 03/08/2016   Procedure: Intravascular Ultrasound/IVUS;  Surgeon: Adrian Prows, MD;  Location: [facility];  Service: Cardiovascular;  Laterality: N/A;  RCA MID  . CESAREAN SECTION     x2  . COLONOSCOPY    . CORONARY ANGIOGRAM  03/08/2016  . CORONARY ANGIOPLASTY     DES LAD, DES D1 02/16/16 Bartolo Darter, [place] DES RCA 03/08/16 [facility])  .  LAMINECTOMY WITH POSTERIOR LATERAL ARTHRODESIS LEVEL 2 N/A 03/07/2017   Procedure: Lumbar Four-Five, Lumbar Five-Sacral One Posterior Lateral Fusion with Removal of pedicle screws;  Surgeon: Eustace Moore, MD;  Location: [place];  Service: Neurosurgery;  Laterality: N/A;    Family History  Problem Relation Age of Onset  . Cancer Mother        pancreatic  . Cancer Maternal Uncle        pancreatic  . Cancer Maternal Grandmother        pancreatic  . Breast cancer Neg Hx     Allergies  Allergen Reactions  . No Known Allergies     Current Outpatient Medications on File Prior to Visit  Medication Sig Dispense Refill  . aspirin 81 MG tablet Take 81 mg by mouth daily.     Marland Kitchen LORazepam (ATIVAN) 1 MG tablet TAKE 1 TABLET BY MOUTH AT 7PM  0  . niacin (NIASPAN) 1000 MG CR tablet Take 1,000 mg by mouth at bedtime.    . Probiotic Product (PROBIOTIC DAILY PO) Take 1 capsule by mouth daily.    . ramipril (ALTACE) 5 MG capsule TAKE 1 BY MOUTH CAPSULE EVERY EVENING AFTER DINNER  3  . rosuvastatin (CRESTOR) 20 MG tablet Take 20 mg by mouth daily.    . TRINTELLIX 20 MG TABS tablet Take 20 mg by mouth every morning.  0  . fexofenadine (ALLEGRA) 180 MG tablet Take 180 mg by mouth daily as needed for allergies.      No current facility-administered medications on file prior to visit.     BP 114/68   Temp 97.9 F (36.6 C) (Oral)   Wt 149 lb (67.6 kg)   BMI 24.79 kg/m       Objective:   Physical Exam  Constitutional: She is oriented to person, place, and time. She appears well-developed and well-nourished. No distress.  Cardiovascular: Normal rate, regular rhythm, normal heart sounds and intact distal pulses. Exam reveals no gallop and no friction rub.  No murmur heard. Pulmonary/Chest: Effort normal and breath sounds normal. No respiratory distress. She has no wheezes. She has no rales. She exhibits no tenderness.  Abdominal: Normal appearance. There is no hepatosplenomegaly, splenomegaly or  hepatomegaly. There is no CVA tenderness.  Neurological: She is alert and oriented to person, place, and time.  Skin: Skin is warm and dry. No rash noted. She is not diaphoretic. No erythema. No  pallor.  Psychiatric: She has a normal mood and affect. Her behavior is normal. Judgment and thought content normal.  Nursing note and vitals reviewed.     Assessment & Plan:  1. Dysuria - POCT Urinalysis Dipstick (Automated)- negative - Will treat with pyridium and send for culture  - phenazopyridine (PYRIDIUM) 100 MG tablet; Take 1 tablet (100 mg total) by mouth 3 (three) times daily as needed for pain.  Dispense: 10 tablet; Refill: 0 - Urine Culture - Stay hydrated  - Follow up as needed  [name], NP

## 2018-02-21 ENCOUNTER — Telehealth: Payer: Self-pay | Admitting: Adult Health

## 2018-02-21 LAB — URINE CULTURE
MICRO NUMBER:: 90448150
SPECIMEN QUALITY:: ADEQUATE

## 2018-02-21 NOTE — Telephone Encounter (Signed)
She does not need to take it if it makes her vomit. She can also try taking it with a large meal

## 2018-02-21 NOTE — Telephone Encounter (Signed)
Left a message on identified voicemail informing the pt to try with a large meal.  If still vomiting than d/c and try AZO OTC.  Call back if needed.

## 2018-02-21 NOTE — Telephone Encounter (Signed)
Copied from [place] [id number]. Topic: General - Other >> Feb 21, 2018  9:25 AM Darl Householder, RMA wrote: Reason for CRM: patient is requesting a callback concerning prescription phenazopyridine (PYRIDIUM) 100 MG tablet , she wants to know if she needs to continue taking medication due to it makes her vomit, please return pt call

## 2018-03-24 ENCOUNTER — Ambulatory Visit: Payer: BC Managed Care – PPO | Admitting: Psychology

## 2018-05-13 ENCOUNTER — Ambulatory Visit (INDEPENDENT_AMBULATORY_CARE_PROVIDER_SITE_OTHER): Payer: BC Managed Care – PPO | Admitting: Psychology

## 2018-05-13 DIAGNOSIS — F331 Major depressive disorder, recurrent, moderate: Secondary | ICD-10-CM

## 2018-06-20 ENCOUNTER — Encounter: Payer: Self-pay | Admitting: Family Medicine

## 2018-07-22 ENCOUNTER — Ambulatory Visit (INDEPENDENT_AMBULATORY_CARE_PROVIDER_SITE_OTHER): Payer: BC Managed Care – PPO | Admitting: Psychology

## 2018-07-22 DIAGNOSIS — F331 Major depressive disorder, recurrent, moderate: Secondary | ICD-10-CM

## 2018-08-20 ENCOUNTER — Ambulatory Visit: Payer: BC Managed Care – PPO | Admitting: Adult Health

## 2018-08-20 ENCOUNTER — Encounter: Payer: Self-pay | Admitting: Adult Health

## 2018-08-20 VITALS — BP 98/54 | HR 69 | Temp 98.4°F | Wt 143.8 lb

## 2018-08-20 DIAGNOSIS — N3001 Acute cystitis with hematuria: Secondary | ICD-10-CM

## 2018-08-20 LAB — POCT URINALYSIS DIPSTICK
BILIRUBIN UA: NEGATIVE
Glucose, UA: NEGATIVE
KETONES UA: NEGATIVE
NITRITE UA: NEGATIVE
PROTEIN UA: POSITIVE — AB
SPEC GRAV UA: 1.01 (ref 1.010–1.025)
Urobilinogen, UA: 0.2 E.U./dL
pH, UA: 7 (ref 5.0–8.0)

## 2018-08-20 MED ORDER — CIPROFLOXACIN HCL 500 MG PO TABS: 500.0000 mg | ORAL_TABLET | Freq: Two times a day (BID) | ORAL | 0 refills | Status: DC

## 2018-08-20 NOTE — Progress Notes (Signed)
Subjective:    Patient ID: Monique Miller, female    DOB: 06-09-1951, 67 y.o.   MRN: [id number]  Urinary Tract Infection   This is a new problem. The current episode started in the past 7 days. The problem occurs intermittently. The quality of the pain is described as burning. There has been no fever. She is sexually active. There is no history of pyelonephritis. Associated symptoms include frequency and urgency. Pertinent negatives include no chills, discharge, flank pain, hematuria or sweats. She has tried nothing for the symptoms. Her past medical history is significant for recurrent UTIs.    Review of Systems  Constitutional: Negative.  Negative for chills.  Gastrointestinal: Negative.   Genitourinary: Positive for decreased urine volume, difficulty urinating, dysuria, frequency and urgency. Negative for flank pain, hematuria, vaginal bleeding and vaginal pain.  All other systems reviewed and are negative.  Past Medical History:  Diagnosis Date  . Allergy   . Anemia    "many years ago"  . Anxiety   . Arthritis   . Cancer ([place])    skin cancer (basal cell)  . Constipation by delayed colonic transit   . Coronary artery disease   . Depression    see [facility] psychotherapist and Trey Paula  . Dyspnea    occ  . Grover's disease   . Headache   . Hx of migraines   . NSTEMI (non-ST elevated myocardial infarction) ([place]) 02/2016  . Pneumonia    walking pneumonia  . Restless leg syndrome   . Thyroid disease    hx of hypothyroid    Social History   Socioeconomic History  . Marital status: Married    Spouse name: Not on file  . Number of children: Not on file  . Years of education: Not on file  . Highest education level: Not on file  Occupational History  . Not on file  Social Needs  . Financial resource strain: Not on file  . Food insecurity:    Worry: Not on file    Inability: Not on file  . Transportation needs:    Medical: Not on file    Non-medical: Not on file  Tobacco Use   . Smoking status: Never Smoker  . Smokeless tobacco: Never Used  Substance and Sexual Activity  . Alcohol use: Yes    Alcohol/week: 7.0 standard drinks    Types: 7 Glasses of wine per week    Comment: occassionally  . Drug use: No  . Sexual activity: Not on file  Lifestyle  . Physical activity:    Days per week: Not on file    Minutes per session: Not on file  . Stress: Not on file  Relationships  . Social connections:    Talks on phone: Not on file    Gets together: Not on file    Attends religious service: Not on file    Active member of club or organization: Not on file    Attends meetings of clubs or organizations: Not on file    Relationship status: Not on file  . Intimate partner violence:    Fear of current or ex partner: Not on file    Emotionally abused: Not on file    Physically abused: Not on file    Forced sexual activity: Not on file  Other Topics Concern  . 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     Past Surgical History:  Procedure Laterality Date  . APPENDECTOMY    . AUGMENTATION MAMMAPLASTY Bilateral   . BACK SURGERY     L4-L5  . BREAST SURGERY     augmentation  . CARDIAC CATHETERIZATION N/A 03/08/2016   Procedure: Left Heart Cath and Coronary Angiography;  Surgeon: Adrian Prows, MD;  Location: [place] [facility];  Service: Cardiovascular;  Laterality: N/A;  . CARDIAC CATHETERIZATION N/A 03/08/2016   Procedure: Coronary Stent Intervention;  Surgeon: Adrian Prows, MD;  Location: [place] [facility];  Service: Cardiovascular;  Laterality: N/A;  [place] 3.5x15  . CARDIAC CATHETERIZATION N/A 03/08/2016   Procedure: Intravascular Ultrasound/IVUS;  Surgeon: Adrian Prows, MD;  Location: [place] [facility];  Service: Cardiovascular;  Laterality: N/A;  RCA  MID  . CESAREAN SECTION     x2  . COLONOSCOPY    . CORONARY ANGIOGRAM  03/08/2016  . CORONARY ANGIOPLASTY     DES LAD, DES D1 02/16/16 Bartolo Darter, [place] DES RCA 03/08/16 [facility])  . LAMINECTOMY WITH POSTERIOR LATERAL ARTHRODESIS LEVEL 2 N/A 03/07/2017   Procedure: Lumbar Four-Five, Lumbar Five-Sacral One Posterior Lateral Fusion with Removal of pedicle screws;  Surgeon: Eustace Moore, MD;  Location: [place];  Service: Neurosurgery;  Laterality: N/A;    Family History  Problem Relation Age of Onset  . Cancer Mother        pancreatic  . Cancer Maternal Uncle        pancreatic  . Cancer Maternal Grandmother        pancreatic  . Breast cancer Neg Hx     Allergies  Allergen Reactions  . No Known Allergies     Current Outpatient Medications on File Prior to Visit  Medication Sig Dispense Refill  . aspirin 81 MG tablet Take 81 mg by mouth daily.     . fexofenadine (ALLEGRA) 180 MG tablet Take 180 mg by mouth daily as needed for allergies.     Marland Kitchen LORazepam (ATIVAN) 1 MG tablet TAKE 1 TABLET BY MOUTH AT 7PM  0  . Melatonin 10 MG TABS Take 10 mg by mouth at bedtime.    . niacin (NIASPAN) 1000 MG CR tablet Take 1,000 mg by mouth at bedtime.    . Probiotic Product (PROBIOTIC DAILY PO) Take 1 capsule by mouth daily.    . ramipril (ALTACE) 5 MG capsule TAKE 1 BY MOUTH CAPSULE EVERY EVENING AFTER DINNER  3  . rosuvastatin (CRESTOR) 20 MG tablet Take 20 mg by mouth daily.    Marland Kitchen FLUoxetine (PROZAC) 20 MG capsule Take 20 mg by mouth at bedtime. Takes with 40mg  tablet qhs  1  . FLUoxetine (PROZAC) 40 MG capsule Take 40 mg by mouth every evening. Takes with 20mg  tablet qhs  2   No current facility-administered medications on file prior to visit.     BP (!) 98/54 (BP Location: Right Arm, Patient Position: Sitting, Cuff Size: Normal)   Pulse 69   Temp 98.4 F (36.9 C) (Oral)   Wt 143 lb 12.8 oz (65.2 kg)   SpO2 97%   BMI 23.93 kg/m        Objective:   Physical Exam  Constitutional: She is  oriented to person, place, and time. She appears well-developed and well-nourished. No distress.  Cardiovascular: Normal rate, regular rhythm, normal heart sounds and intact distal pulses.  Pulmonary/Chest: Effort normal and breath sounds normal.  Abdominal: Soft. Bowel sounds are normal. She exhibits no distension and no mass. There is no tenderness. There is no rigidity, no rebound, no guarding and no CVA tenderness. No hernia.  Neurological: She is alert and oriented to person, place, and time.  Skin: Skin is warm and dry. She is not diaphoretic.  Psychiatric: She has a normal mood and affect. Her behavior is normal. Judgment and thought content normal.  Nursing note and vitals reviewed.     Assessment & Plan:  1. Acute cystitis with hematuria - POC Urinalysis Dipstick - + blood, protein, and [place]. Will treat with Cipro due to symptoms and send for culture  - Urine Culture; Future - Urine Culture - ciprofloxacin (CIPRO) 500 MG tablet; Take 1 tablet (500 mg total) by mouth 2 (two) times daily.  Dispense: 6 tablet; Refill: 0  [name], NP

## 2018-08-24 LAB — URINE CULTURE
MICRO NUMBER: 91215252
SPECIMEN QUALITY:: ADEQUATE

## 2018-09-15 ENCOUNTER — Telehealth: Payer: Self-pay | Admitting: *Deleted

## 2018-09-15 ENCOUNTER — Ambulatory Visit (INDEPENDENT_AMBULATORY_CARE_PROVIDER_SITE_OTHER): Payer: Medicare Other | Admitting: Psychology

## 2018-09-15 DIAGNOSIS — F331 Major depressive disorder, recurrent, moderate: Secondary | ICD-10-CM

## 2018-09-15 NOTE — Telephone Encounter (Signed)
Copied from [place] [phone] [id number]. Topic: General - Other >> Sep 15, 2018 10:23 AM Oneta Rack wrote: Relation to pt: self Call back number: [phone] Pharmacy: [id number] [place], [place] [phone] ([phone] (Fax)  Reason for call:  Patient scheduled [facility] Appointment with Tommi Rumps for 09/18/18 at [place], patient inquiring about shingle vaccination, please advise if in stock.

## 2018-09-18 ENCOUNTER — Ambulatory Visit (INDEPENDENT_AMBULATORY_CARE_PROVIDER_SITE_OTHER): Payer: Medicare Other | Admitting: Adult Health

## 2018-09-18 ENCOUNTER — Ambulatory Visit: Payer: Medicare Other | Admitting: Adult Health

## 2018-09-18 ENCOUNTER — Encounter: Payer: Self-pay | Admitting: Adult Health

## 2018-09-18 VITALS — BP 124/76 | HR 73 | Temp 97.9°F | Resp 16 | Ht 65.0 in | Wt 142.3 lb

## 2018-09-18 DIAGNOSIS — I2510 Atherosclerotic heart disease of native coronary artery without angina pectoris: Secondary | ICD-10-CM | POA: Diagnosis not present

## 2018-09-18 DIAGNOSIS — F329 Major depressive disorder, single episode, unspecified: Secondary | ICD-10-CM

## 2018-09-18 DIAGNOSIS — Z780 Asymptomatic menopausal state: Secondary | ICD-10-CM

## 2018-09-18 DIAGNOSIS — F32A Depression, unspecified: Secondary | ICD-10-CM

## 2018-09-18 DIAGNOSIS — Z0000 Encounter for general adult medical examination without abnormal findings: Secondary | ICD-10-CM | POA: Diagnosis not present

## 2018-09-18 DIAGNOSIS — Z1159 Encounter for screening for other viral diseases: Secondary | ICD-10-CM

## 2018-09-18 DIAGNOSIS — F419 Anxiety disorder, unspecified: Secondary | ICD-10-CM

## 2018-09-18 LAB — HEPATIC FUNCTION PANEL
ALK PHOS: 48 U/L (ref 39–117)
ALT: 18 U/L (ref 0–35)
AST: 24 U/L (ref 0–37)
Albumin: 4.5 g/dL (ref 3.5–5.2)
BILIRUBIN DIRECT: 0.2 mg/dL (ref 0.0–0.3)
Total Bilirubin: 0.7 mg/dL (ref 0.2–1.2)
Total Protein: 6.7 g/dL (ref 6.0–8.3)

## 2018-09-18 LAB — CBC WITH DIFFERENTIAL/PLATELET
Basophils Absolute: 0.1 10*3/uL (ref 0.0–0.1)
Basophils Relative: 1.9 % (ref 0.0–3.0)
Eosinophils Absolute: 0.2 10*3/uL (ref 0.0–0.7)
Eosinophils Relative: 4.2 % (ref 0.0–5.0)
HCT: 42.9 % (ref 36.0–46.0)
Hemoglobin: 14.3 g/dL (ref 12.0–15.0)
LYMPHS ABS: 2.2 10*3/uL (ref 0.7–4.0)
Lymphocytes Relative: 48.1 % — ABNORMAL HIGH (ref 12.0–46.0)
MCHC: 33.3 g/dL (ref 30.0–36.0)
MCV: 94.4 fl (ref 78.0–100.0)
MONO ABS: 0.4 10*3/uL (ref 0.1–1.0)
MONOS PCT: 8.8 % (ref 3.0–12.0)
NEUTROS PCT: 37 % — AB (ref 43.0–77.0)
Neutro Abs: 1.7 10*3/uL (ref 1.4–7.7)
Platelets: 168 10*3/uL (ref 150.0–400.0)
RBC: 4.55 Mil/uL (ref 3.87–5.11)
RDW: 13 % (ref 11.5–15.5)
WBC: 4.5 10*3/uL (ref 4.0–10.5)

## 2018-09-18 LAB — HEMOGLOBIN A1C: HEMOGLOBIN A1C: 6.2 % (ref 4.6–6.5)

## 2018-09-18 LAB — TSH: TSH: 2.03 u[IU]/mL (ref 0.35–4.50)

## 2018-09-18 LAB — BASIC METABOLIC PANEL
BUN: 17 mg/dL (ref 6–23)
CALCIUM: 9.3 mg/dL (ref 8.4–10.5)
CO2: 30 mEq/L (ref 19–32)
Chloride: 99 mEq/L (ref 96–112)
Creatinine, Ser: 0.87 mg/dL (ref 0.40–1.20)
GFR: 69.45 mL/min (ref >60.00)
GLUCOSE: 111 mg/dL — AB (ref 70–99)
Potassium: 3.9 mEq/L (ref 3.5–5.1)
SODIUM: 137 meq/L (ref 135–145)

## 2018-09-18 LAB — LIPID PANEL
CHOL/HDL RATIO: 2
Cholesterol: 136 mg/dL (ref 0–200)
HDL: 73.9 mg/dL (ref >39.00)
LDL Cholesterol: 52 mg/dL (ref 0–99)
NONHDL: 62.23
Triglycerides: 50 mg/dL (ref 0.0–149.0)
VLDL: 10 mg/dL (ref 0.0–40.0)

## 2018-09-18 NOTE — Progress Notes (Signed)
Medicare Annual Preventive Care Visit  (initial annual wellness or annual wellness exam)  Concerns and/or follow up today:  Hyperlipidemia/CAD with left heart cath and coronary angiography in 2017  - takes Crestor, asa, and niacin daily. Is seen by Dr. Einar Gip yearly  Lab Results  Component Value Date   CHOL 109 03/27/2016   HDL 51.50 03/27/2016   LDLCALC 47 03/27/2016   LDLDIRECT 115.5 11/09/2011   TRIG 53.0 03/27/2016   CHOLHDL 2 03/27/2016   Anxiety/Depression - is followed by psychiatry - takes Prozac and Ativan daily. Feels as though she her symptoms are well controlled.    See HM section in Epic for other details of completed HM. See scanned documentation under Media Tab for further documentation HPI, health risk assessment. See Media Tab and Care Teams sections in Epic for other providers.  ROS: negative for report of fevers, unintentional weight loss, vision changes, vision loss, hearing loss or change, chest pain, sob, hemoptysis, melena, hematochezia, hematuria, genital discharge or lesions, falls, bleeding or bruising, loc, thoughts of suicide or self harm, memory loss  1.) Patient-completed health risk assessment  - completed and reviewed, see scanned documentation  2.) Review of Medical History: -PMH, PSH, Family History and current specialty and care providers reviewed and updated and listed below  - see scanned in document in chart and below  Past Medical History:  Diagnosis Date  . Allergy   . Anemia    "many years ago"  . Anxiety   . Arthritis   . Cancer ([place])    skin cancer (basal cell)  . Constipation by delayed colonic transit   . Coronary artery disease   . Depression    see [facility] psychotherapist and Trey Paula  . Dyspnea    occ  . Grover's disease   . Headache   . Hx of migraines   . NSTEMI (non-ST elevated myocardial infarction) ([place]) 02/2016  . Pneumonia    walking pneumonia  . Restless leg syndrome   . Thyroid disease    hx of hypothyroid    Past  Surgical History:  Procedure Laterality Date  . APPENDECTOMY    . AUGMENTATION MAMMAPLASTY Bilateral   . BACK SURGERY     L4-L5  . BREAST SURGERY     augmentation  . CARDIAC CATHETERIZATION N/A 03/08/2016   Procedure: Left Heart Cath and Coronary Angiography;  Surgeon: Adrian Prows, MD;  Location: [facility];  Service: Cardiovascular;  Laterality: N/A;  . CARDIAC CATHETERIZATION N/A 03/08/2016   Procedure: Coronary Stent Intervention;  Surgeon: Adrian Prows, MD;  Location: [facility];  Service: Cardiovascular;  Laterality: N/A;  Resolute 3.5x15  . CARDIAC CATHETERIZATION N/A 03/08/2016   Procedure: Intravascular Ultrasound/IVUS;  Surgeon: Adrian Prows, MD;  Location: [facility];  Service: Cardiovascular;  Laterality: N/A;  RCA MID  . CESAREAN SECTION     x2  . COLONOSCOPY    . CORONARY ANGIOGRAM  03/08/2016  . CORONARY ANGIOPLASTY     DES LAD, DES D1 02/16/16 [facility] [place], [place] DES RCA 03/08/16 [facility])  . LAMINECTOMY WITH POSTERIOR LATERAL ARTHRODESIS LEVEL 2 N/A 03/07/2017   Procedure: Lumbar Four-Five, Lumbar Five-Sacral One Posterior Lateral Fusion with Removal of pedicle screws;  Surgeon: Eustace Moore, MD;  Location: [facility];  Service: Neurosurgery;  Laterality: N/A;    Social History   Socioeconomic History  . Marital status: Married    Spouse name: Not on file  . Number of children: Not on file  .  Years of education: Not on file  . Highest education level: Not on file  Occupational History  . Not on file  Social Needs  . Financial resource strain: Not on file  . Food insecurity:    Worry: Not on file    Inability: Not on file  . Transportation needs:    Medical: Not on file    Non-medical: Not on file  Tobacco Use  . Smoking status: Never Smoker  . Smokeless tobacco: Never Used  Substance and Sexual Activity  . Alcohol use: Yes    Alcohol/week: 7.0 standard drinks    Types: 7 Glasses of wine per week    Comment: occassionally  . Drug use: No  . Sexual  activity: Not on file  Lifestyle  . Physical activity:    Days per week: Not on file    Minutes per session: Not on file  . Stress: Not on file  Relationships  . Social connections:    Talks on phone: Not on file    Gets together: Not on file    Attends religious service: Not on file    Active member of club or organization: Not on file    Attends meetings of clubs or organizations: Not on file    Relationship status: Not on file  . Intimate partner violence:    Fear of current or ex partner: Not on file    Emotionally abused: Not on file    Physically abused: Not on file    Forced sexual activity: Not on file  Other Topics Concern  . 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     Family History  Problem Relation Age of Onset  . Cancer Mother        pancreatic  . Cancer Maternal Uncle        pancreatic  . Cancer Maternal Grandmother        pancreatic  . Breast cancer Neg Hx     Current Outpatient Medications on File Prior to Visit  Medication Sig Dispense Refill  . aspirin 81 MG tablet Take 81 mg by mouth daily.     Marland Kitchen FLUOXETINE HCL PO Take 80 mg by mouth every evening. Takes with 20mg  tablet qhs  2  . LORazepam (ATIVAN) 1 MG tablet TAKE 1 TABLET BY MOUTH AT 7PM  0  . Melatonin 10 MG TABS Take 10 mg by mouth at bedtime.    . niacin (NIASPAN) 1000 MG CR tablet Take 1,000 mg by mouth at bedtime.    . Probiotic Product (PROBIOTIC DAILY PO) Take 1 capsule by mouth daily.    . ramipril (ALTACE) 5 MG capsule TAKE 1 BY MOUTH CAPSULE EVERY EVENING AFTER DINNER  3  . rosuvastatin (CRESTOR) 20 MG tablet Take 20 mg by mouth daily.     No current facility-administered medications on file prior to visit.      3.) Review of functional ability and level of  safety:  Any difficulty hearing? None   History of falling? None   Any trouble with IADLs - using a phone, using transportation, grocery shopping, preparing meals, doing housework, doing laundry, taking medications and managing money?  None   Advance Directives?  Discussed briefly and offered more resources and detailed discussion with our trained staff.  She declines; has an advanced directive and living will   See summary of recommendations in Patient Instructions below.  4.) Physical Exam Vitals:   09/18/18 1038  BP: 124/76  Pulse: 73  Resp: 16  Temp: 97.9 F (36.6 C)  SpO2: 98%   Estimated body mass index is 23.68 kg/m as calculated from the following:   Height as of this encounter: 5\' 5"  (1.651 m).   Weight as of this encounter: 142 lb 5 oz (64.6 kg).  EKG (optional): NSR, Rate 61   General: alert, appear well hydrated and in no acute distress  HEENT: visual acuity grossly intact  CV: HRRR  Lungs: CTA bilaterally  Psych: pleasant and cooperative, no obvious depression or anxiety  Cognitive function grossly intact  See patient instructions for recommendations.  Education and counseling regarding the above review of health provided with a plan for the following: -see scanned patient completed form for further details -fall prevention strategies discussed  -healthy lifestyle discussed -importance and resources for completing advanced directives discussed -see patient instructions below for any other recommendations provided  4)The following written screening schedule of preventive measures were reviewed with assessment and plan made per below, orders and patient instructions:      AAA screening done if applicable     Alcohol screening done     Obesity Screening and counseling done     STI screening (Hep C if born [birth year]) offered and per pt wishes     Tobacco Screening done       Pneumococcal (PPSV23 -one dose after 64, one before if risk factors),  influenza yearly and hepatitis B vaccines (if high risk - end stage renal disease, IV drugs, homosexual men, live in home for mentally retarded, hemophilia receiving factors) ASSESSMENT/PLAN: done if applicable      Screening mammograph (yearly if >40) ASSESSMENT/PLAN: utd       Screening Pap smear/pelvic exam (q2 years) ASSESSMENT/PLAN:  declined      Prostate cancer screening ASSESSMENT/PLAN: n/a      Colorectal cancer screening (FOBT yearly or flex sig q4y or colonoscopy q10y or barium enema q4y) ASSESSMENT/PLAN: utd       Diabetes outpatient self-management training services ASSESSMENT/PLAN: utd       Bone mass measurements(covered q2y if indicated - estrogen def, osteoporosis, hyperparathyroid, vertebral abnormalities, osteoporosis or steroids) ASSESSMENT/PLAN:  discussed and ordered per pt wishes      Screening for glaucoma(q1y if high risk - diabetes, FH, AA and > 50 or hispanic and > 65) ASSESSMENT/PLAN: utd       Medical nutritional therapy for individuals with diabetes or renal disease ASSESSMENT/PLAN: see orders      Cardiovascular screening blood tests (lipids q5y) ASSESSMENT/PLAN: see orders and labs      Diabetes screening tests ASSESSMENT/PLAN: see orders and labs   7.) Summary: -risk factors and conditions per above assessment were discussed and treatment, recommendations and referrals were offered per documentation above and orders and patient instructions.  Medicare annual wellness visit, initial - Plan: Basic metabolic panel, CBC with Differential/Platelet, Hemoglobin A1c, Hepatic function panel, Lipid panel, TSH, EKG 12-Lead  Need for hepatitis C screening test - Plan: Hep C Antibody  Anxiety  Depression, unspecified depression type  Coronary artery disease involving native coronary artery of native heart without angina pectoris - Plan: Basic metabolic panel, CBC with Differential/Platelet, Hemoglobin A1c, Hepatic function panel,  Lipid panel, TSH, EKG  12-Lead  Post-menopausal - Plan: DG Bone Density  Patient Instructions  It was great seeing you today   We will follow up with you regarding your blood work   You can schedule your bone density screen at [facility] on your way out.   Please let me know if you need anything      [name], NP

## 2018-09-18 NOTE — Telephone Encounter (Signed)
Spoke with patient and she will get her Shingrix at her pharmacy.

## 2018-09-18 NOTE — Progress Notes (Deleted)
Medicare Annual Preventive Care Visit  (initial annual wellness or annual wellness exam)  Concerns and/or follow up today:  CAD with left heart cath and Coronary Angiography in 2017- Takes Crestor, ASA, and Niacin   Essential Hypertension - takes Altace 5 mg  BP Readings from Last 3 Encounters:  08/20/18 (!) 98/54  02/20/18 114/68  08/29/17 120/66   Anxiety/Depression - controlled with Prozac and Ativan PRN - is seen by Psychiatry   See HM section in Epic for other details of completed HM. See scanned documentation under Media Tab for further documentation HPI, health risk assessment. See Media Tab and Care Teams sections in Epic for other providers.  ROS: negative for report of fevers, unintentional weight loss, vision changes, vision loss, hearing loss or change, chest pain, sob, hemoptysis, melena, hematochezia, hematuria, genital discharge or lesions, falls, bleeding or bruising, loc, thoughts of suicide or self harm, memory loss  1.) Patient-completed health risk assessment  - completed and reviewed, see scanned documentation  2.) Review of Medical History: -PMH, PSH, Family History and current specialty and care providers reviewed and updated and listed below  - see scanned in document in chart and below  Past Medical History:  Diagnosis Date  . Allergy   . Anemia    "many years ago"  . Anxiety   . Arthritis   . Cancer ([place])    skin cancer (basal cell)  . Constipation by delayed colonic transit   . Coronary artery disease   . Depression    see [facility] Child psychotherapist and Trey Paula  . Dyspnea    occ  . Grover's disease   . Headache   . Hx of migraines   . NSTEMI (non-ST elevated myocardial infarction) ([place]) 02/2016  . Pneumonia    walking pneumonia  . Restless leg syndrome   . Thyroid disease    hx of hypothyroid    Past Surgical History:  Procedure Laterality Date  . APPENDECTOMY    . AUGMENTATION MAMMAPLASTY Bilateral   . BACK SURGERY     L4-L5  . BREAST  SURGERY     augmentation  . CARDIAC CATHETERIZATION N/A 03/08/2016   Procedure: Left Heart Cath and Coronary Angiography;  Surgeon: Adrian Prows, MD;  Location: [facility];  Service: Cardiovascular;  Laterality: N/A;  . CARDIAC CATHETERIZATION N/A 03/08/2016   Procedure: Coronary Stent Intervention;  Surgeon: Adrian Prows, MD;  Location: [facility];  Service: Cardiovascular;  Laterality: N/A;  Resolute 3.5x15  . CARDIAC CATHETERIZATION N/A 03/08/2016   Procedure: Intravascular Ultrasound/IVUS;  Surgeon: Adrian Prows, MD;  Location: [facility];  Service: Cardiovascular;  Laterality: N/A;  RCA MID  . CESAREAN SECTION     x2  . COLONOSCOPY    . CORONARY ANGIOGRAM  03/08/2016  . CORONARY ANGIOPLASTY     DES LAD, DES D1 02/16/16 [facility] [place], [place] DES RCA 03/08/16 [facility])  . LAMINECTOMY WITH POSTERIOR LATERAL ARTHRODESIS LEVEL 2 N/A 03/07/2017   Procedure: Lumbar Four-Five, Lumbar Five-Sacral One Posterior Lateral Fusion with Removal of pedicle screws;  Surgeon: Eustace Moore, MD;  Location: [facility];  Service: Neurosurgery;  Laterality: N/A;    Social History   Socioeconomic History  . Marital status: Married    Spouse name: Not on file  . Number of children: Not on file  . Years of education: Not on file  . Highest education level: Not on file  Occupational History  . Not on file  Social Needs  . Emergency planning/management officer  strain: Not on file  . Food insecurity:    Worry: Not on file    Inability: Not on file  . Transportation needs:    Medical: Not on file    Non-medical: Not on file  Tobacco Use  . Smoking status: Never Smoker  . Smokeless tobacco: Never Used  Substance and Sexual Activity  . Alcohol use: Yes    Alcohol/week: 7.0 standard drinks    Types: 7 Glasses of wine per week    Comment: occassionally  . Drug use: No  . Sexual activity: Not on file  Lifestyle  . Physical activity:    Days per week: Not on file    Minutes per session: Not on file  . Stress: Not on file   Relationships  . Social connections:    Talks on phone: Not on file    Gets together: Not on file    Attends religious service: Not on file    Active member of club or organization: Not on file    Attends meetings of clubs or organizations: Not on file    Relationship status: Not on file  . Intimate partner violence:    Fear of current or ex partner: Not on file    Emotionally abused: Not on file    Physically abused: Not on file    Forced sexual activity: Not on file  Other Topics Concern  . 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     Family History  Problem Relation Age of Onset  . Cancer Mother        pancreatic  . Cancer Maternal Uncle        pancreatic  . Cancer Maternal Grandmother        pancreatic  . Breast cancer Neg Hx     Current Outpatient Medications on File Prior to Visit  Medication Sig Dispense Refill  . aspirin 81 MG tablet Take 81 mg by mouth daily.     . ciprofloxacin (CIPRO) 500 MG tablet Take 1 tablet (500 mg total) by mouth 2 (two) times daily. 6 tablet 0  . fexofenadine (ALLEGRA) 180 MG tablet Take 180 mg by mouth daily as needed for allergies.     Marland Kitchen FLUoxetine (PROZAC) 20 MG capsule Take 20 mg by mouth at bedtime. Takes with 40mg  tablet qhs  1  . FLUoxetine (PROZAC) 40 MG capsule Take 40 mg by mouth every evening. Takes with 20mg  tablet qhs  2  . LORazepam (ATIVAN) 1 MG tablet TAKE 1 TABLET BY MOUTH AT 7PM  0  . Melatonin 10 MG TABS Take 10 mg by mouth at bedtime.    . niacin (NIASPAN) 1000 MG CR tablet Take 1,000 mg by mouth at bedtime.    . Probiotic Product (PROBIOTIC DAILY PO) Take 1 capsule by mouth daily.    . ramipril (ALTACE) 5 MG capsule TAKE 1 BY MOUTH CAPSULE EVERY EVENING AFTER DINNER  3  . rosuvastatin (CRESTOR) 20 MG  tablet Take 20 mg by mouth daily.     No current facility-administered medications on file prior to visit.      3.) Review of functional ability and level of safety:  Any difficulty hearing? None  History of falling? None  Any trouble with IADLs - using a phone, using transportation, grocery shopping, preparing meals, doing housework, doing laundry, taking medications and managing money? No  Advance Directives?  Discussed briefly and offered more resources and detailed discussion with our trained staff.   See summary of recommendations in Patient Instructions below.  4.) Physical Exam There were no vitals filed for this visit. Estimated body mass index is 23.93 kg/m as calculated from the following:   Height as of 04/01/17: 5\' 5"  (1.651 m).   Weight as of 08/20/18: 143 lb 12.8 oz (65.2 kg).  EKG (optional): deferred  General: alert, appear well hydrated and in no acute distress  HEENT: visual acuity grossly intact  CV: HRRR  Lungs: CTA bilaterally  Psych: pleasant and cooperative, no obvious depression or anxiety  Cognitive function grossly intact  See patient instructions for recommendations.  Education and counseling regarding the above review of health provided with a plan for the following: -see scanned patient completed form for further details -fall prevention strategies discussed  -healthy lifestyle discussed -importance and resources for completing advanced directives discussed -see patient instructions below for any other recommendations provided  4)The following written screening schedule of preventive measures were reviewed with assessment and plan made per below, orders and patient instructions:      AAA screening done if applicable     Alcohol screening done     Obesity Screening and counseling done     STI screening (Hep C if born 64-65) offered and per pt wishes     Tobacco Screening done done       Pneumococcal (PPSV23 -one dose after 64, one  before if risk factors), influenza yearly and hepatitis B vaccines (if high risk - end stage renal disease, IV drugs, homosexual men, live in home for mentally retarded, hemophilia receiving factors) ASSESSMENT/PLAN: done if applicable      Screening mammograph (yearly if >40) ASSESSMENT/PLAN: utd      Screening Pap smear/pelvic exam (q2 years) ASSESSMENT/PLAN: n/a, declined      Prostate cancer screening ASSESSMENT/PLAN: n/a      Colorectal cancer screening (FOBT yearly or flex sig q4y or colonoscopy q10y or barium enema q4y) ASSESSMENT/PLAN: utd       Diabetes outpatient self-management training services ASSESSMENT/PLAN: utd or done      Bone mass measurements(covered q2y if indicated - estrogen def, osteoporosis, hyperparathyroid, vertebral abnormalities, osteoporosis or steroids) ASSESSMENT/PLAN: utd or discussed and ordered per pt wishes      Screening for glaucoma(q1y if high risk - diabetes, FH, AA and > 50 or hispanic and > 65) ASSESSMENT/PLAN: utd      Medical nutritional therapy for individuals with diabetes or renal disease ASSESSMENT/PLAN: see orders      Cardiovascular screening blood tests (lipids q5y) ASSESSMENT/PLAN: see orders and labs      Diabetes screening tests ASSESSMENT/PLAN: see orders and labs   7.) Summary: -risk factors and conditions per above assessment were discussed and treatment, recommendations and referrals were offered per documentation above and orders and patient instructions.  No diagnosis found.  There are no Patient Instructions on file for this visit.  [name], NP

## 2018-09-18 NOTE — Patient Instructions (Signed)
It was great seeing you today   We will follow up with you regarding your blood work   You can schedule your bone density screen at [facility] on your way out.   Please let me know if you need anything

## 2018-09-19 ENCOUNTER — Telehealth: Payer: Self-pay | Admitting: Adult Health

## 2018-09-19 LAB — HEPATITIS C ANTIBODY
HEP C AB: NONREACTIVE
SIGNAL TO CUT-OFF: 0.04 (ref <1.00)

## 2018-09-19 MED ORDER — TOLTERODINE TARTRATE 2 MG PO TABS: 2.0000 mg | ORAL_TABLET | Freq: Two times a day (BID) | ORAL | 3 refills | Status: AC

## 2018-09-19 NOTE — Telephone Encounter (Signed)
Updated patient on labs.   She is requesting refill of Detrol

## 2018-09-22 ENCOUNTER — Ambulatory Visit (INDEPENDENT_AMBULATORY_CARE_PROVIDER_SITE_OTHER)
Admission: RE | Admit: 2018-09-22 | Discharge: 2018-09-22 | Disposition: A | Discharge status: Home / Self Care | Payer: Medicare Other | Source: Ambulatory Visit | Attending: Adult Health | Admitting: Adult Health

## 2018-09-22 ENCOUNTER — Other Ambulatory Visit: Payer: Medicare Other

## 2018-09-22 DIAGNOSIS — Z780 Asymptomatic menopausal state: Secondary | ICD-10-CM | POA: Diagnosis not present

## 2018-09-22 DIAGNOSIS — Z0000 Encounter for general adult medical examination without abnormal findings: Secondary | ICD-10-CM

## 2018-09-23 ENCOUNTER — Encounter: Payer: Self-pay | Admitting: Adult Health

## 2018-09-23 ENCOUNTER — Other Ambulatory Visit: Payer: Self-pay | Admitting: Adult Health

## 2018-09-23 MED ORDER — METFORMIN HCL 500 MG PO TABS: 250.0000 mg | ORAL_TABLET | Freq: Every day | ORAL | 1 refills | Status: DC

## 2018-09-23 NOTE — Telephone Encounter (Signed)
Spoke to patient and answered her questions on pre diabetes. We decided to start her on 250 mg metformin daily.   She has started exercising and will follow up in 3 months or sooner if needed

## 2018-09-26 ENCOUNTER — Encounter: Payer: Self-pay | Admitting: Adult Health

## 2018-10-28 ENCOUNTER — Other Ambulatory Visit: Payer: Self-pay | Admitting: Adult Health

## 2018-10-28 DIAGNOSIS — Z1231 Encounter for screening mammogram for malignant neoplasm of breast: Secondary | ICD-10-CM

## 2018-10-31 LAB — HM DIABETES EYE EXAM

## 2018-11-18 ENCOUNTER — Ambulatory Visit (INDEPENDENT_AMBULATORY_CARE_PROVIDER_SITE_OTHER): Payer: Medicare Other | Admitting: Psychology

## 2018-11-18 DIAGNOSIS — F331 Major depressive disorder, recurrent, moderate: Secondary | ICD-10-CM

## 2018-11-28 ENCOUNTER — Encounter: Payer: Self-pay | Admitting: Family Medicine

## 2018-12-04 ENCOUNTER — Ambulatory Visit
Admission: RE | Admit: 2018-12-04 | Discharge: 2018-12-04 | Disposition: A | Discharge status: Home / Self Care | Payer: Medicare Other | Source: Ambulatory Visit | Attending: Adult Health | Admitting: Adult Health

## 2018-12-04 DIAGNOSIS — Z1231 Encounter for screening mammogram for malignant neoplasm of breast: Secondary | ICD-10-CM

## 2018-12-25 ENCOUNTER — Encounter: Payer: Self-pay | Admitting: Adult Health

## 2018-12-26 ENCOUNTER — Encounter: Payer: Self-pay | Admitting: Adult Health

## 2018-12-30 ENCOUNTER — Ambulatory Visit (INDEPENDENT_AMBULATORY_CARE_PROVIDER_SITE_OTHER): Payer: Medicare Other | Admitting: Psychology

## 2018-12-30 DIAGNOSIS — F331 Major depressive disorder, recurrent, moderate: Secondary | ICD-10-CM

## 2018-12-31 ENCOUNTER — Encounter: Payer: Self-pay | Admitting: Adult Health

## 2019-01-01 ENCOUNTER — Other Ambulatory Visit: Payer: Self-pay

## 2019-01-01 MED ORDER — NIACIN ER (ANTIHYPERLIPIDEMIC) 1000 MG PO TBCR: 1000.0000 mg | EXTENDED_RELEASE_TABLET | Freq: Every day | ORAL | 1 refills | Status: DC

## 2019-01-01 MED ORDER — RAMIPRIL 5 MG PO CAPS: ORAL_CAPSULE | ORAL | 1 refills | Status: DC

## 2019-01-12 IMAGING — CR DG MYELOGRAPHY LUMBAR INJ LUMBOSACRAL
13 of 16 series · 13 of 16 positions shown · non-contrast
Comparison: Lumbar spine MRI 12/18/2016 and myelogram 07/02/2014

CLINICAL DATA: Low back pain and right lower extremity pain. Prior
lumbar fusion.
TECHNIQUE: Contiguous axial images were obtained through the Lumbar spine after
the intrathecal infusion of infusion. Coronal and sagittal
reconstructions were obtained of the axial image sets.

[w lumbar spine lat]
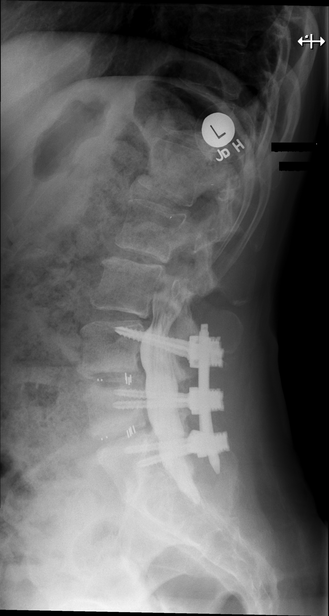

[vasc adipose (1 of 11)]
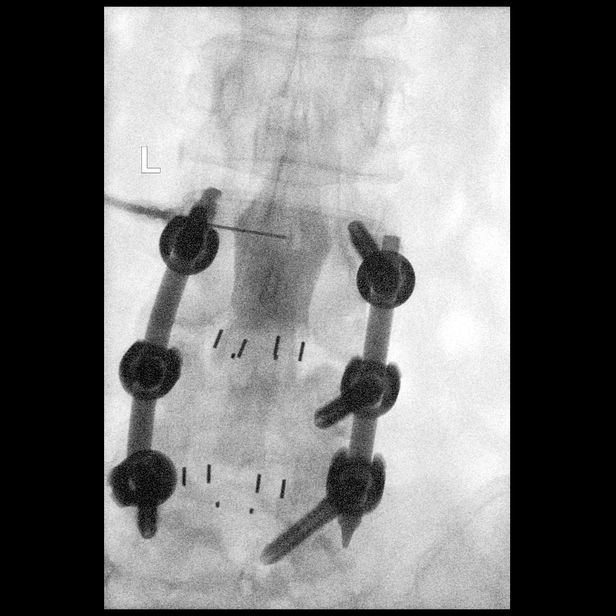

[vasc adipose (2 of 11)]
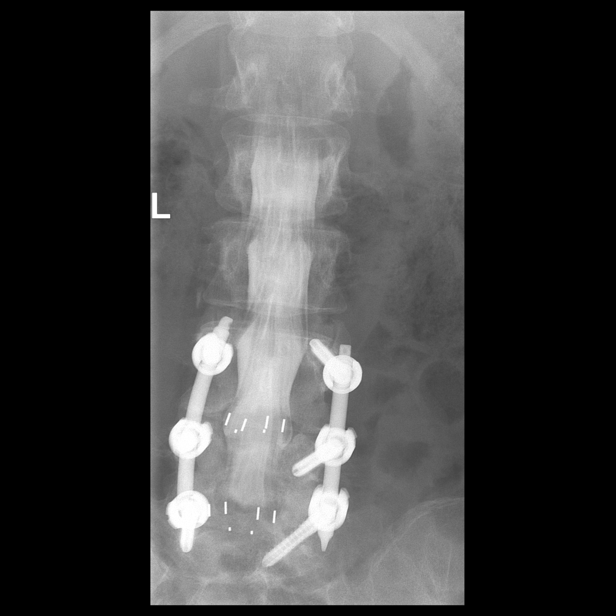

[w lumbar spine extension]
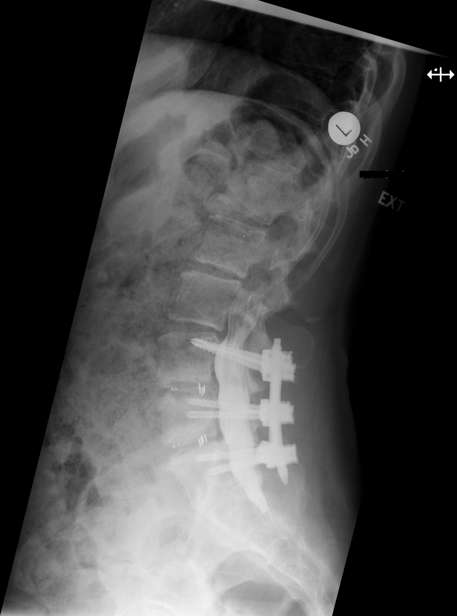

[vasc adipose (3 of 11)]
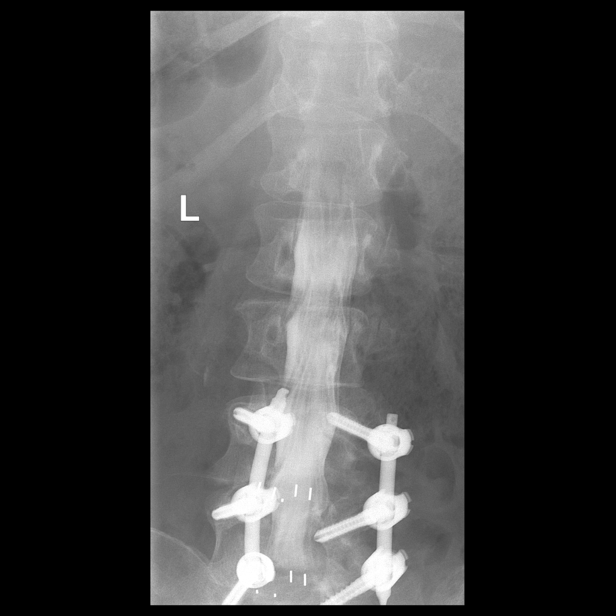

[vasc adipose (4 of 11)]
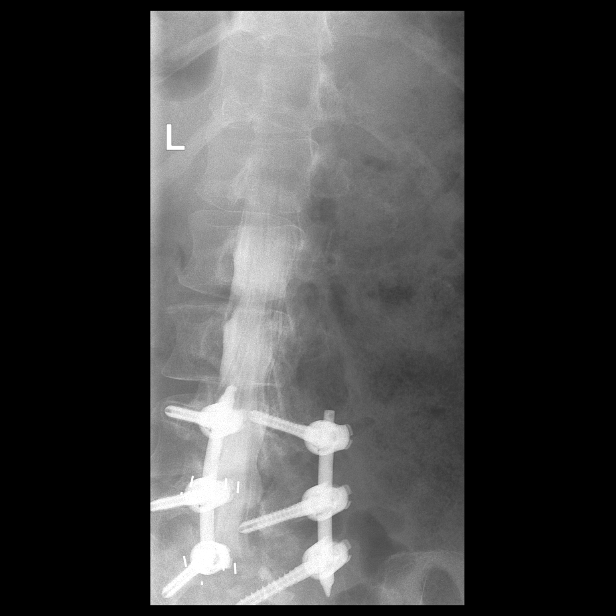

[vasc adipose (5 of 11)]
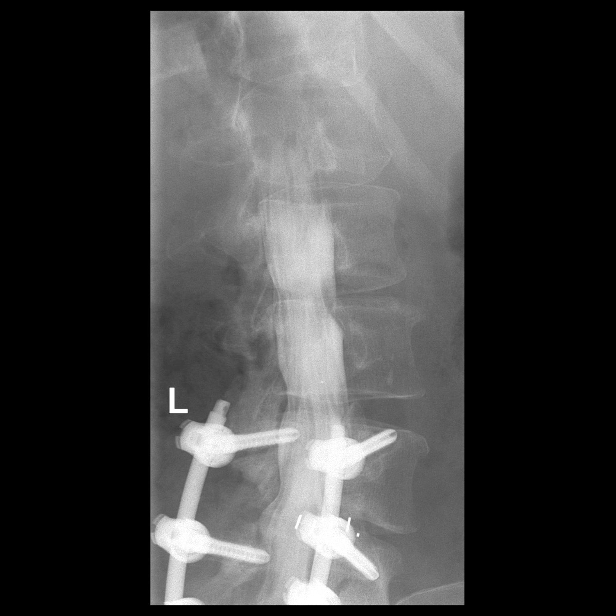

[vasc adipose (6 of 11)]
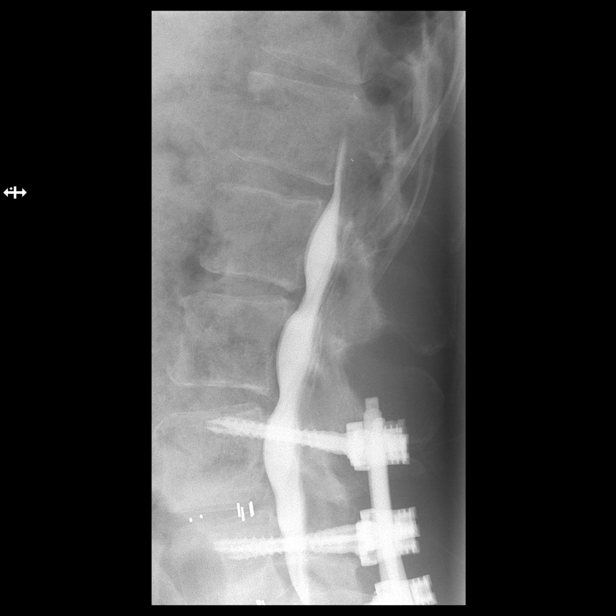

[vasc adipose (7 of 11)]
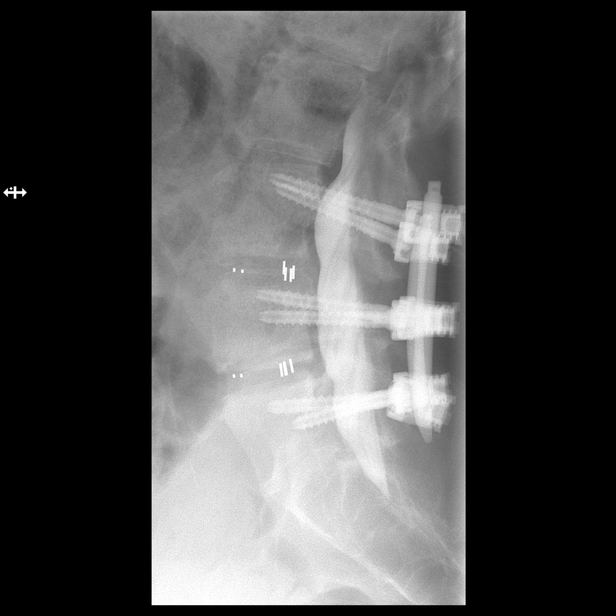

[vasc adipose (8 of 11)]
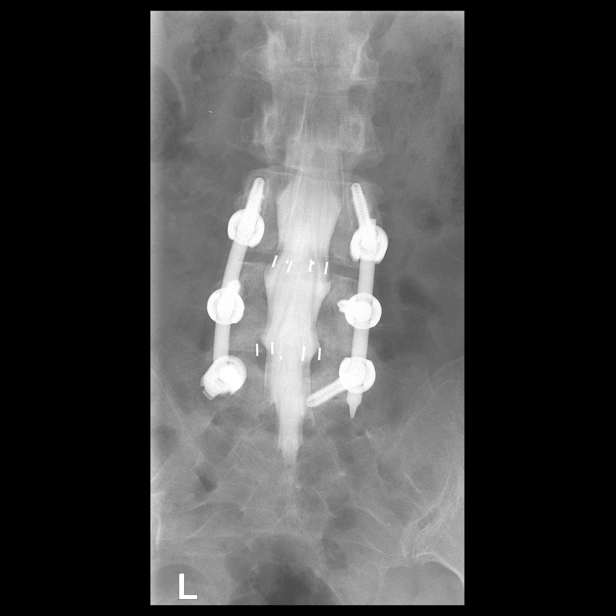

[vasc adipose (9 of 11)]
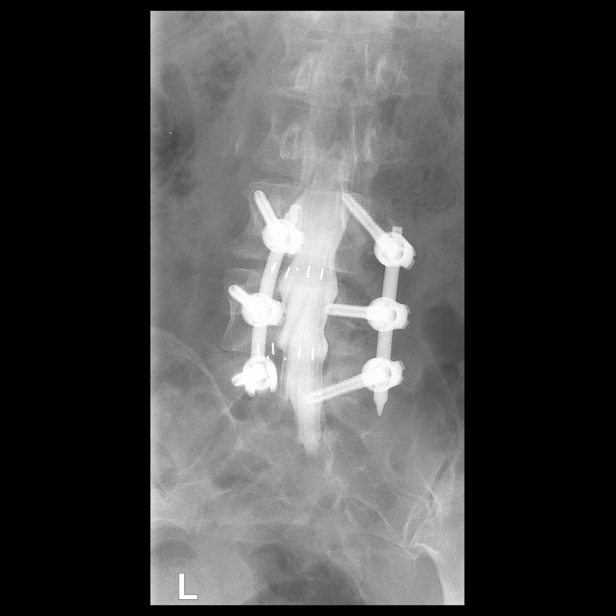

[vasc adipose (10 of 11)]
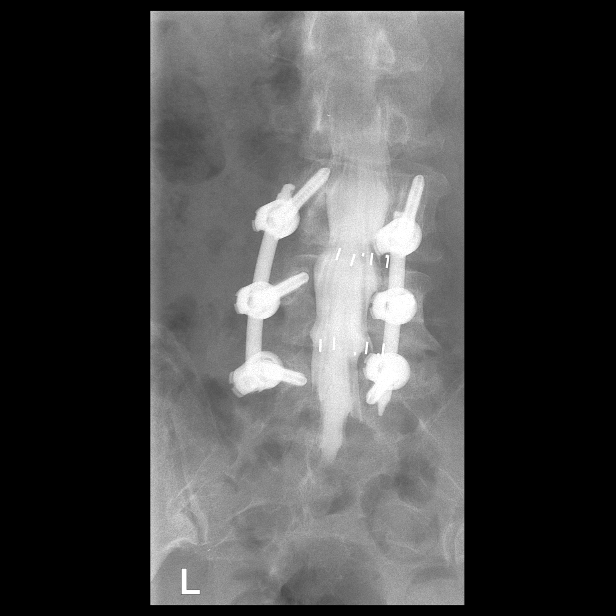

[vasc adipose (11 of 11)]
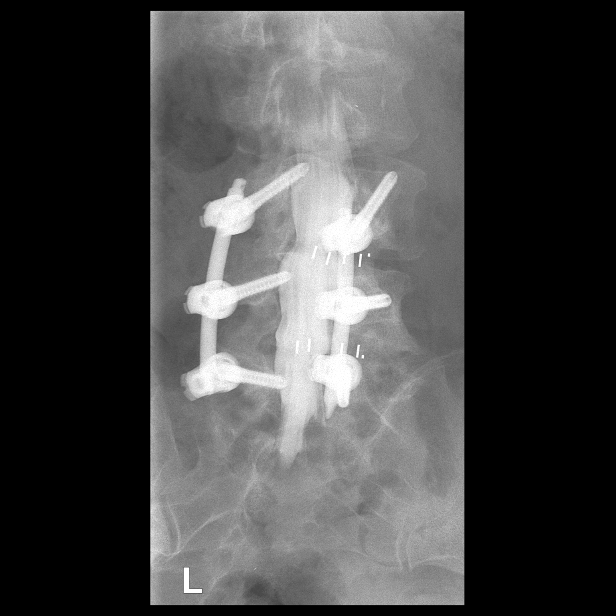

[13 of 16 positions shown; findings below may reference images not displayed]

EXAM:
LUMBAR MYELOGRAM

FLUOROSCOPY TIME:  Radiation Exposure Index (as provided by the
fluoroscopic device): 145.78 microGray*m^2

Fluoroscopy Time (in minutes and seconds):  21 seconds

PROCEDURE:
After thorough discussion of risks and benefits of the procedure
including bleeding, infection, injury to nerves, blood vessels,
adjacent structures as well as headache and CSF leak, written and
oral informed consent was obtained. Consent was obtained by Dr.
Caleb Aujla. Time out form was completed.

Patient was positioned prone on the fluoroscopy table. Local
anesthesia was provided with 1% lidocaine without epinephrine after
prepped and draped in the usual sterile fashion. Puncture was
performed at L3-4 using a 3 1/2 inch 22-gauge spinal needle via a
left interlaminar approach. Using a single pass through the dura,
the needle was placed within the thecal sac, with return of clear
CSF. 15 mL of Isovue M 200 was injected into the thecal sac, with
normal opacification of the nerve roots and cauda equina consistent
with free flow within the subarachnoid space.

I personally performed the lumbar puncture and administered the
intrathecal contrast. I also personally supervised acquisition of
the myelogram images.
FINDINGS: LUMBAR MYELOGRAM FINDINGS:

There is minimal right convex curvature of the lumbar spine. Trace
retrolisthesis is noted of L2 on L3 and L3 on L4 without significant
change during flexion or extension. Sequelae of L4-S1 posterior and
interbody fusion are identified. There is varying degrees of lucency
about all of the pedicle screws. The right S1 screw appears medially
positioned. There is a ventral extradural defect at L3-4 with mild
waist like narrowing of the thecal sac. A shallow ventral extradural
defect is also present at L2-3. The thecal sac appears widely patent
from L4-S1.

CT LUMBAR MYELOGRAM FINDINGS:

There is minimal right convex curvature of the lumbar spine. Minimal
retrolisthesis of L2 on L3 and L3 on L4 measure 2 mm. Sequelae of
L4-S1 posterior and interbody fusion are again identified. Solid
osseous fusion is not identified at either level. There is lucency
about the L4 greater than L5 screws bilaterally. There is lucency
diffusely about the right S1 screw and minimal lucency about the
posterior aspect of the left S1 screw. The right S1 screw courses
well medial to the pedicle and crosses immediately superior to the
right S1 nerve root as it enters its foramen.

No fracture is identified. The conus medullaris terminates at L1-2.
A mild abdominal aortic atherosclerosis is noted without aneurysm.

L1-2:  Minimal disc bulging without stenosis, unchanged.

L2-3: Mild disc space narrowing with Schmorl's nodes.
Circumferential disc bulging without significant stenosis,
unchanged.

L3-4: Mild circumferential disc bulging and mild facet and
ligamentum flavum hypertrophy results in minimal left neural
foraminal narrowing without spinal stenosis, unchanged.

L4-5: Prior fusion. Widely patent spinal canal. Leftward disc
bulging results in minimal left neural foraminal narrowing, improved
from the preoperative myelogram.

L5-S1: Prior fusion. Widely patent spinal canal. Disc bulging and
facet arthrosis result in mild right neural foraminal stenosis,
improved from the preoperative myelogram.

S1-2: Rudimentary disc. Right S1 pedicle screw potentially impinging
upon the right S1 nerve root as above.
IMPRESSION: 1. Postoperative changes L4-S1 with loosening of all the pedicle
screws and no evidence of solid osseous fusion. The right S1 screw
courses medial to the pedicle and may result in right S1 nerve root
impingement.
2. Disc degeneration and facet arthrosis elsewhere as above without
evidence of neural impingement.

## 2019-01-16 ENCOUNTER — Other Ambulatory Visit: Payer: Self-pay

## 2019-01-16 MED ORDER — RAMIPRIL 5 MG PO CAPS: ORAL_CAPSULE | ORAL | 1 refills | Status: DC

## 2019-01-16 MED ORDER — NIACIN ER (ANTIHYPERLIPIDEMIC) 1000 MG PO TBCR: 1000.0000 mg | EXTENDED_RELEASE_TABLET | Freq: Every day | ORAL | 1 refills | Status: DC

## 2019-02-17 ENCOUNTER — Ambulatory Visit: Payer: Medicare Other | Admitting: Psychology

## 2019-02-23 ENCOUNTER — Telehealth: Payer: Self-pay

## 2019-02-23 ENCOUNTER — Other Ambulatory Visit: Payer: Self-pay

## 2019-02-23 DIAGNOSIS — I2510 Atherosclerotic heart disease of native coronary artery without angina pectoris: Secondary | ICD-10-CM

## 2019-02-23 MED ORDER — NIACIN ER (ANTIHYPERLIPIDEMIC) 1000 MG PO TBCR: 1000.0000 mg | EXTENDED_RELEASE_TABLET | Freq: Two times a day (BID) | ORAL | 3 refills | Status: DC

## 2019-02-23 NOTE — Telephone Encounter (Signed)
Pt called and she says that her niacin changed she used to take 1000 mg BID and now its QD has it been reduced since feb 2020

## 2019-02-23 NOTE — Telephone Encounter (Signed)
Must have been refilled incorrectly. Looks like it was refilled in March for Niacin 1000mg  2 tablets every evening after dinner according to [employer].

## 2019-03-23 ENCOUNTER — Ambulatory Visit: Payer: Medicare Other | Admitting: Psychology

## 2019-03-25 ENCOUNTER — Other Ambulatory Visit: Payer: Self-pay | Admitting: Adult Health

## 2019-03-25 NOTE — Telephone Encounter (Signed)
We can retest her A1c for impaired glucose levels

## 2019-03-26 NOTE — Telephone Encounter (Signed)
Left a message for a return call.  Pt needs a1c and virtual visit.

## 2019-03-31 ENCOUNTER — Other Ambulatory Visit (INDEPENDENT_AMBULATORY_CARE_PROVIDER_SITE_OTHER): Payer: Medicare Other

## 2019-03-31 ENCOUNTER — Other Ambulatory Visit: Payer: Self-pay

## 2019-03-31 ENCOUNTER — Other Ambulatory Visit: Payer: Self-pay | Admitting: Adult Health

## 2019-03-31 DIAGNOSIS — R7309 Other abnormal glucose: Secondary | ICD-10-CM | POA: Diagnosis not present

## 2019-03-31 LAB — HEMOGLOBIN A1C: Hgb A1c MFr Bld: 6.3 % (ref 4.6–6.5)

## 2019-04-01 ENCOUNTER — Ambulatory Visit (INDEPENDENT_AMBULATORY_CARE_PROVIDER_SITE_OTHER): Payer: Medicare Other | Admitting: Adult Health

## 2019-04-01 ENCOUNTER — Other Ambulatory Visit: Payer: Self-pay

## 2019-04-01 ENCOUNTER — Encounter: Payer: Self-pay | Admitting: Adult Health

## 2019-04-01 DIAGNOSIS — R7302 Impaired glucose tolerance (oral): Secondary | ICD-10-CM

## 2019-04-01 NOTE — Telephone Encounter (Signed)
Sent to the pharmacy by e-scribe for 6 months per [facility].

## 2019-04-01 NOTE — Telephone Encounter (Signed)
Please read

## 2019-04-01 NOTE — Progress Notes (Signed)
Virtual Visit via Video Note  I connected with Monique Miller on 04/01/19 at 10:00 AM EDT by a video enabled telemedicine application and verified that I am speaking with the correct person using two identifiers.  Location patient: home Location provider:work or home office Persons participating in the virtual visit: patient, provider  I discussed the limitations of evaluation and management by telemedicine and the availability of in person appointments. The patient expressed understanding and agreed to proceed.   HPI: 68 year old female who is being evaluated today for 53-month follow-up regarding impaired glucose tolerance.  November 2019 her A1c was 6.2.  We decided collectively to start her on metformin 250 mg daily for tighter blood sugar control.  She reports today that she has not been taking metformin on a routine basis.  She is exercising and walking 4 miles 3 to 4 days a week.  She continues to try and eat healthy but does eat sweets throughout the week.  She also has 1 alcoholic drink a night.   ROS: See pertinent positives and negatives per HPI.  Past Medical History:  Diagnosis Date  . Allergy   . Anemia    "many years ago"  . Anxiety   . Arthritis   . Cancer ([place])    skin cancer (basal cell)  . Constipation by delayed colonic transit   . Coronary artery disease   . Depression    see [facility] Child psychotherapist and Trey Paula  . Dyspnea    occ  . Grover's disease   . Headache   . Hx of migraines   . NSTEMI (non-ST elevated myocardial infarction) ([place]) 02/2016  . Pneumonia    walking pneumonia  . Restless leg syndrome   . Thyroid disease    hx of hypothyroid    Past Surgical History:  Procedure Laterality Date  . APPENDECTOMY    . AUGMENTATION MAMMAPLASTY Bilateral   . BACK SURGERY     L4-L5  . BREAST SURGERY     augmentation  . CARDIAC CATHETERIZATION N/A 03/08/2016   Procedure: Left Heart Cath and Coronary Angiography;  Surgeon: Adrian Prows, MD;  Location: [place] [facility];  Service: Cardiovascular;  Laterality: N/A;  . CARDIAC CATHETERIZATION N/A 03/08/2016   Procedure: Coronary Stent Intervention;  Surgeon: Adrian Prows, MD;  Location: [place] [facility];  Service: Cardiovascular;  Laterality: N/A;  Resolute 3.5x15  . CARDIAC CATHETERIZATION N/A 03/08/2016   Procedure: Intravascular Ultrasound/IVUS;  Surgeon: Adrian Prows, MD;  Location: [place] [facility];  Service: Cardiovascular;  Laterality: N/A;  RCA MID  . CESAREAN SECTION     x2  . COLONOSCOPY    . CORONARY ANGIOGRAM  03/08/2016  . CORONARY ANGIOPLASTY     DES LAD, DES D1 02/16/16 [facility], [place] DES RCA 03/08/16 [facility])  . LAMINECTOMY WITH POSTERIOR LATERAL ARTHRODESIS LEVEL 2 N/A 03/07/2017   Procedure: Lumbar Four-Five, Lumbar Five-Sacral One Posterior Lateral Fusion with Removal of pedicle screws;  Surgeon: Eustace Moore, MD;  Location: [place];  Service: Neurosurgery;  Laterality: N/A;    Family History  Problem Relation Age of Onset  . Cancer Mother        pancreatic  . Cancer Maternal Uncle        pancreatic  . Cancer Maternal Grandmother        pancreatic  . Breast cancer Neg Hx      Current Outpatient Medications:  .  aspirin 81 MG tablet, Take 81 mg by mouth daily. , Disp: , Rfl:  .  FLUOXETINE HCL PO, Take 80 mg by mouth every evening. Takes with 20mg  tablet qhs, Disp: , Rfl: 2 .  LORazepam (ATIVAN) 1 MG tablet, TAKE 1 TABLET BY MOUTH AT 7PM, Disp: , Rfl: 0 .  Melatonin 10 MG TABS, Take 10 mg by mouth at bedtime., Disp: , Rfl:  .  metFORMIN (GLUCOPHAGE) 500 MG tablet, TAKE 0.5 TABLETS (250 MG TOTAL) BY MOUTH DAILY WITH BREAKFAST., Disp: 45 tablet, Rfl: 1 .  niacin (NIASPAN) 1000 MG CR tablet, Take 1 tablet (1,000 mg total) by mouth 2 (two) times daily., Disp: 180 tablet, Rfl: 3 .  Probiotic Product (PROBIOTIC DAILY PO), Take 1 capsule by mouth daily., Disp: , Rfl:  .  ramipril (ALTACE) 5 MG capsule, TAKE 1 BY MOUTH CAPSULE EVERY EVENING AFTER DINNER, Disp: 90 capsule, Rfl: 1 .  rosuvastatin  (CRESTOR) 20 MG tablet, Take 20 mg by mouth daily., Disp: , Rfl:   EXAM:  VITALS per patient if applicable:  GENERAL: alert, oriented, appears well and in no acute distress  HEENT: atraumatic, conjunttiva clear, no obvious abnormalities on inspection of external nose and ears  NECK: normal movements of the head and neck  LUNGS: on inspection no signs of respiratory distress, breathing rate appears normal, no obvious gross SOB, gasping or wheezing  CV: no obvious cyanosis  MS: moves all visible extremities without noticeable abnormality  PSYCH/NEURO: pleasant and cooperative, no obvious depression or anxiety, speech and thought processing grossly intact  ASSESSMENT AND PLAN:  Discussed the following assessment and plan:  A1c has increased to 6.3  Encouraged to continue to exercise and work on diet.  To cut back on sweets.  Advised to take metformin 250 mg daily and we will retest in 6 months  Impaired glucose tolerance     I discussed the assessment and treatment plan with the patient. The patient was provided an opportunity to ask questions and all were answered. The patient agreed with the plan and demonstrated an understanding of the instructions.   The patient was advised to call back or seek an in-person evaluation if the symptoms worsen or if the condition fails to improve as anticipated.   [name], NP

## 2019-04-27 ENCOUNTER — Ambulatory Visit: Payer: Medicare Other | Admitting: Psychology

## 2019-05-26 ENCOUNTER — Encounter: Payer: Self-pay | Admitting: Adult Health

## 2019-06-03 ENCOUNTER — Ambulatory Visit: Payer: Self-pay | Admitting: Cardiology

## 2019-06-23 ENCOUNTER — Other Ambulatory Visit: Payer: Self-pay | Admitting: Adult Health

## 2019-06-24 NOTE — Telephone Encounter (Signed)
DENIED. FILLED ON 09/20/2019 FOR 1 YEAR.  REQUEST IS EARLY.  MESSAGE SENT TO THE PHARMACY.

## 2019-06-25 ENCOUNTER — Other Ambulatory Visit: Payer: Self-pay

## 2019-06-25 MED ORDER — ROSUVASTATIN CALCIUM 20 MG PO TABS: 20.0000 mg | ORAL_TABLET | Freq: Every day | ORAL | 0 refills | Status: DC

## 2019-08-03 ENCOUNTER — Other Ambulatory Visit: Payer: Self-pay | Admitting: Cardiology

## 2019-08-05 LAB — LIPID PANEL W/O CHOL/HDL RATIO
Cholesterol, Total: 130 mg/dL (ref 100–199)
HDL: 77 mg/dL (ref >39)
LDL Chol Calc (NIH): 42 mg/dL (ref 0–99)
Triglycerides: 47 mg/dL (ref 0–149)
VLDL Cholesterol Cal: 11 mg/dL (ref 5–40)

## 2019-08-05 LAB — COMPREHENSIVE METABOLIC PANEL
ALT: 29 IU/L (ref 0–32)
AST: 36 IU/L (ref 0–40)
Albumin/Globulin Ratio: 2.1 (ref 1.2–2.2)
Albumin: 4.2 g/dL (ref 3.8–4.8)
Alkaline Phosphatase: 54 IU/L (ref 39–117)
BUN/Creatinine Ratio: 14 (ref 12–28)
BUN: 15 mg/dL (ref 8–27)
Bilirubin Total: 0.5 mg/dL (ref 0.0–1.2)
CO2: 22 mmol/L (ref 20–29)
Calcium: 9.2 mg/dL (ref 8.7–10.3)
Chloride: 104 mmol/L (ref 96–106)
Creatinine, Ser: 1.05 mg/dL — ABNORMAL HIGH (ref 0.57–1.00)
GFR calc Af Amer: 64 mL/min/{1.73_m2} (ref >59)
GFR calc non Af Amer: 56 mL/min/{1.73_m2} — ABNORMAL LOW (ref >59)
Globulin, Total: 2 g/dL (ref 1.5–4.5)
Glucose: 103 mg/dL — ABNORMAL HIGH (ref 65–99)
Potassium: 5 mmol/L (ref 3.5–5.2)
Sodium: 141 mmol/L (ref 134–144)
Total Protein: 6.2 g/dL (ref 6.0–8.5)

## 2019-08-05 LAB — LIPOPROTEIN A (LPA): Lipoprotein (a): 120.7 nmol/L — ABNORMAL HIGH (ref <75.0)

## 2019-08-05 LAB — TSH: TSH: 2.3 u[IU]/mL (ref 0.450–4.500)

## 2019-08-07 ENCOUNTER — Ambulatory Visit: Payer: Self-pay | Admitting: Cardiology

## 2019-08-12 ENCOUNTER — Other Ambulatory Visit: Payer: Self-pay

## 2019-08-25 ENCOUNTER — Ambulatory Visit: Payer: Medicare Other | Admitting: Cardiology

## 2019-08-25 ENCOUNTER — Other Ambulatory Visit: Payer: Self-pay

## 2019-08-25 ENCOUNTER — Encounter: Payer: Self-pay | Admitting: Cardiology

## 2019-08-25 VITALS — BP 118/71 | HR 74 | Temp 98.1°F | Ht 65.0 in | Wt 146.6 lb

## 2019-08-25 DIAGNOSIS — E782 Mixed hyperlipidemia: Secondary | ICD-10-CM

## 2019-08-25 DIAGNOSIS — I252 Old myocardial infarction: Secondary | ICD-10-CM

## 2019-08-25 DIAGNOSIS — Z8249 Family history of ischemic heart disease and other diseases of the circulatory system: Secondary | ICD-10-CM

## 2019-08-25 DIAGNOSIS — E7841 Elevated Lipoprotein(a): Secondary | ICD-10-CM | POA: Diagnosis not present

## 2019-08-25 DIAGNOSIS — I2510 Atherosclerotic heart disease of native coronary artery without angina pectoris: Secondary | ICD-10-CM

## 2019-08-25 NOTE — Progress Notes (Signed)
Primary Physician/Referring:  [name], NP  Patient ID: Monique Miller, female    DOB: 10/15/1951, 68 y.o.   MRN: [id number]  Chief Complaint  Patient presents with  . Coronary Artery Disease  . Follow-up   HPI:    Monique Miller  is a 68 y.o. coronary artery disease and underwent PCI to LAD and D1 with the 2.75 x 23 mm Xience DES and 2.5 x 15 mm Xience DES respectively in [place], [place] for occluded diagonal and mid LAD. Had residual stenosis for which she underwent IVUS guided PCI to the RCA with a 3.5x15 mm Resolute DES on 03/08/2016. She had normal LDL but markedly elevated LPA and hence is presently on high-dose statin along with niacin which is tolerating. She does have a family history of premature CAD as her brother had an MI at age 43. she also has multiple second-degree relatives with CAD as well.    No specific complaints today, presently doing well, back pain is under control, takes ibuprofen on a p.r.n. basis and Tylenol.  She and her husband concerned about Covid 62, want to visit her family and have multiple questions.  Past Medical History:  Diagnosis Date  . Allergy   . Anemia    "many years ago"  . Anxiety   . Arthritis   . Cancer ([place])    skin cancer (basal cell)  . Constipation by delayed colonic transit   . Coronary artery disease   . Depression    see [facility] Child psychotherapist and Trey Paula  . Dyspnea    occ  . Grover's disease   . Headache   . Hx of migraines   . NSTEMI (non-ST elevated myocardial infarction) ([place]) 02/2016  . Pneumonia    walking pneumonia  . Restless leg syndrome   . Thyroid disease    hx of hypothyroid   Past Surgical History:  Procedure Laterality Date  . APPENDECTOMY    . AUGMENTATION MAMMAPLASTY Bilateral   . BACK SURGERY     L4-L5  . BREAST SURGERY     augmentation  . CARDIAC CATHETERIZATION N/A 03/08/2016   Procedure: Left Heart Cath and Coronary Angiography;  Surgeon: Adrian Prows, MD;  Location: [place] [facility];   Service: Cardiovascular;  Laterality: N/A;  . CARDIAC CATHETERIZATION N/A 03/08/2016   Procedure: Coronary Stent Intervention;  Surgeon: Adrian Prows, MD;  Location: [place] [facility];  Service: Cardiovascular;  Laterality: N/A;  Resolute 3.5x15  . CARDIAC CATHETERIZATION N/A 03/08/2016   Procedure: Intravascular Ultrasound/IVUS;  Surgeon: Adrian Prows, MD;  Location: [place] [facility];  Service: Cardiovascular;  Laterality: N/A;  RCA MID  . CESAREAN SECTION     x2  . COLONOSCOPY    . CORONARY ANGIOGRAM  03/08/2016  . CORONARY ANGIOPLASTY     DES LAD, DES D1 02/16/16 [facility], [place] DES RCA 03/08/16 [facility])  . LAMINECTOMY WITH POSTERIOR LATERAL ARTHRODESIS LEVEL 2 N/A 03/07/2017   Procedure: Lumbar Four-Five, Lumbar Five-Sacral One Posterior Lateral Fusion with Removal of pedicle screws;  Surgeon: Eustace Moore, MD;  Location: [place];  Service: Neurosurgery;  Laterality: N/A;   Social History   Socioeconomic History  . Marital status: Married    Spouse name: Not on file  . Number of children: 2  . Years of education: Not on file  . Highest education level: Not on file  Occupational History  . Not on file  Social Needs  . Financial resource strain: Not on file  . Food  insecurity    Worry: Not on file    Inability: Not on file  . Transportation needs    Medical: Not on file    Non-medical: Not on file  Tobacco Use  . Smoking status: Never Smoker  . Smokeless tobacco: Never Used  Substance and Sexual Activity  . Alcohol use: Yes    Alcohol/week: 7.0 standard drinks    Types: 7 Glasses of wine per week    Comment: occassionally  . Drug use: No  . Sexual activity: Not on file  Lifestyle  . Physical activity    Days per week: Not on file    Minutes per session: Not on file  . Stress: Not on file  Relationships  . Social Herbalist on phone: Not on file    Gets together: Not on file    Attends religious service: Not on file    Active member of club or organization: Not on  file    Attends meetings of clubs or organizations: Not on file    Relationship status: Not on file  . Intimate partner violence    Fear of current or ex partner: Not on file    Emotionally abused: Not on file    Physically abused: Not on file    Forced sexual activity: Not on file  Other Topics Concern  . 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    ROS  Review of Systems  Constitution: Negative for chills, decreased appetite, malaise/fatigue and weight gain.  Cardiovascular: Negative for dyspnea on exertion, leg swelling and syncope.  Endocrine: Negative for cold intolerance.  Hematologic/Lymphatic: Does not bruise/bleed easily.  Musculoskeletal: Positive for back pain. Negative for joint swelling.  Gastrointestinal: Negative for abdominal pain, anorexia, change in bowel habit, hematochezia and melena.  Neurological: Negative for headaches and light-headedness.  Psychiatric/Behavioral: Negative for depression and substance abuse.  All other systems reviewed and are negative.  Objective   Vitals with BMI 08/25/2019 09/18/2018 08/20/2018  Height 5' 5"  5' 5"  -  Weight 146 lbs 10 oz 142 lbs 5 oz 143 lbs 13 oz  BMI 74.1 28.78 -  Systolic 676 720 98  Diastolic 71 76 54  Pulse 74 73 69    Blood pressure 118/71, pulse 74, temperature 98.1 F (36.7 C), height 5' 5"  (1.651 m), weight 146 lb 9.6 oz (66.5 kg), SpO2 98 %. Body mass index is 24.4 kg/m.   Physical Exam  Constitutional:  She is moderately built and well-nourished in no acute distress.  HENT:  Head: Atraumatic.  Eyes: Conjunctivae are normal.  Neck: Neck supple. No JVD present. No thyromegaly present.  Cardiovascular: Normal rate, regular rhythm, normal heart sounds and intact distal pulses. Exam reveals no  gallop.  No murmur heard. No leg edema, no JVD.  Pulmonary/Chest: Effort normal and breath sounds normal.  Abdominal: Soft. Bowel sounds are normal.  Musculoskeletal: Normal range of motion.  Neurological: She is alert.  Skin: Skin is warm and dry.  Psychiatric: She has a normal mood and affect.   Radiology: No results found.  Laboratory examination:   10/29/2017: Cholesterol 118, triglycerides 52, HDL 72, LDL 36.  Glucose 110, creatinine 1.12, EGFR 52/60, potassium 4.4, CMP normal.  Lipoprotein a 135.   Ref Range & Units 08/03/2019  Lipoprotein (a) <75.0 nmol/L 120.7High     Recent Labs    09/18/18 1124 08/03/19 0845  NA 137 141  K 3.9 5.0  CL 99 104  CO2 30 22  GLUCOSE 111* 103*  BUN 17 15  CREATININE 0.87 1.05*  CALCIUM 9.3 9.2  GFRNONAA  --  56*  GFRAA  --  64   CMP Latest Ref Rng & Units 08/03/2019 09/18/2018 04/01/2017  Glucose 65 - 99 mg/dL 103(H) 111(H) 129(H)  BUN 8 - 27 mg/dL 15 17 11   Creatinine 0.57 - 1.00 mg/dL 1.05(H) 0.87 0.85  Sodium 134 - 144 mmol/L 141 137 139  Potassium 3.5 - 5.2 mmol/L 5.0 3.9 4.3  Chloride 96 - 106 mmol/L 104 99 104  CO2 20 - 29 mmol/L 22 30 26   Calcium 8.7 - 10.3 mg/dL 9.2 9.3 9.2  Total Protein 6.0 - 8.5 g/dL 6.2 6.7 6.7  Total Bilirubin 0.0 - 1.2 mg/dL 0.5 0.7 0.8  Alkaline Phos 39 - 117 IU/L 54 48 72  AST 0 - 40 IU/L 36 24 45(H)  ALT 0 - 32 IU/L 29 18 40   CBC Latest Ref Rng & Units 09/18/2018 04/01/2017 02/27/2017  WBC 4.0 - 10.5 K/uL 4.5 5.4 5.7  Hemoglobin 12.0 - 15.0 g/dL 14.3 13.3 14.1  Hematocrit 36.0 - 46.0 % 42.9 40.4 42.2  Platelets 150.0 - 400.0 K/uL 168.0 189 173   Lipid Panel     Component Value Date/Time   CHOL 130 08/03/2019 0845   TRIG 47 08/03/2019 0845   HDL 77 08/03/2019 0845   CHOLHDL 2 09/18/2018 1124   VLDL 10.0 09/18/2018 1124   LDLCALC 42 08/03/2019 0845   LDLDIRECT 115.5 11/09/2011 0840   HEMOGLOBIN A1C Lab Results  Component Value Date   HGBA1C 6.3 03/31/2019   TSH Recent Labs     09/18/18 1124 08/03/19 0845  TSH 2.03 2.300   Medications and allergies   Allergies  Allergen Reactions  . No Known Allergies      Prior to Admission medications   Medication Sig Start Date End Date Taking? Authorizing Provider  aspirin 81 MG tablet Take 81 mg by mouth daily.     [provider]  FLUOXETINE HCL PO Take 80 mg by mouth every evening. Takes with 69m tablet qhs 07/31/18   [provider]  LORazepam (ATIVAN) 1 MG tablet TAKE 1 TABLET BY MOUTH AT 7PM 01/21/18   [provider]  Melatonin 10 MG TABS Take 10 mg by mouth at bedtime.    [provider]  metFORMIN (GLUCOPHAGE) 500 MG tablet TAKE 0.5 TABLETS (250 MG TOTAL) BY MOUTH DAILY WITH BREAKFAST. 04/01/19 06/30/19  Nafziger, CTommi Rumps NP  niacin (NIASPAN) 1000 MG CR tablet Take 1 tablet (1,000 mg total) by mouth 2 (two) times daily. 02/23/19   KMiquel Dunn NP  Probiotic Product (PROBIOTIC DAILY PO) Take 1 capsule by mouth daily.    [provider]  ramipril (ALTACE) 5 MG capsule TAKE 1 BY MOUTH CAPSULE EVERY EVENING AFTER DINNER 01/16/19   GAdrian Prows MD  rosuvastatin (CRESTOR) 20 MG tablet Take 1 tablet (20 mg total) by mouth daily. 06/25/19   GAdrian Prows MD     Current Outpatient Medications  Medication Instructions  . aspirin 81 mg, Oral, Daily  . FLUOXETINE HCL PO 80 mg, Oral, Every evening  . LORazepam (ATIVAN)  1 MG tablet TAKE 1 TABLET BY MOUTH AT 7PM  . Melatonin 10 mg, Oral, Daily at bedtime  . metFORMIN (GLUCOPHAGE) 250 mg, Oral, Daily with breakfast  . niacin (NIASPAN) 1,000 mg, Oral, 2 times daily  . Probiotic Product (PROBIOTIC DAILY PO) 1 capsule, Oral, Daily  . ramipril (ALTACE) 5 MG capsule TAKE 1 BY MOUTH CAPSULE EVERY EVENING AFTER DINNER  . rosuvastatin (CRESTOR) 20 mg, Oral, Daily  . tolterodine (DETROL) 2 mg, Oral, 2 times daily    Cardiac Studies:    Coronary angiogram 03/08/2016: PCI to Prox RCA with 3.5x1 mm Resolute. Acute MI 02/16/2016 S/P PCI to  LAD and D1 with the 2.75 x 23 mm Xience DES and 2.5 x 15 mm Xience DES respectively. Normal LVEF.  Echocardiogram 02/15/2016: Normal LV systolic function.  Performed in [place], [place]   Assessment     ICD-10-CM   1. Coronary artery disease involving native coronary artery of native heart without angina pectoris  I25.10 EKG 12-Lead  2. H/O acute myocardial infarction of anterior wall  I25.2    02/16/16: LAD stent for NSTEMI  3. Elevated lipoprotein(a)  E78.41   4. Mixed hyperlipidemia  E78.2   5. Family history of premature coronary artery disease  Z82.49     brother had an MI at age 55, multiple second-degree relatives with CAD in 66 years of age.    EKG 08/25/2019: Normal sinus rhythm at the rate of 67 bpm, normal axis.  No evidence of ischemia, normal EKG. No significant change from EKG 06/04/2018  Recommendations:   Ms. Wisby is presently doing well and essentially remains asymptomatic with regard to coronary disease without recurrence of angina pectoris.  She had multiple questions regarding Covid 19 and reactive discussed regarding social distancing and also regarding safety and precautions that she needs to take.  Blood pressure is well controlled, lipids are controlled, LPA has improved on niacin.  I discussed with her regarding "heritage" trial for patient's with MRI and elevated LPA.  Her husband is present at the bedside and all cautions answered.  I'd like to see her back in one year.  Adrian Prows, MD, [facility] 08/25/2019, 9:17 PM [facility] [place] Pager: [phone] Office: [phone] If no answer Cell [phone]

## 2019-08-28 ENCOUNTER — Other Ambulatory Visit: Payer: Self-pay

## 2019-08-28 DIAGNOSIS — E782 Mixed hyperlipidemia: Secondary | ICD-10-CM

## 2019-08-28 MED ORDER — ROSUVASTATIN CALCIUM 20 MG PO TABS: 20.0000 mg | ORAL_TABLET | Freq: Every day | ORAL | 3 refills | Status: DC

## 2019-09-14 ENCOUNTER — Ambulatory Visit (INDEPENDENT_AMBULATORY_CARE_PROVIDER_SITE_OTHER): Payer: Medicare Other | Admitting: Psychology

## 2019-09-14 ENCOUNTER — Other Ambulatory Visit: Payer: Self-pay | Admitting: Cardiology

## 2019-09-14 DIAGNOSIS — F331 Major depressive disorder, recurrent, moderate: Secondary | ICD-10-CM | POA: Diagnosis not present

## 2019-09-19 ENCOUNTER — Other Ambulatory Visit: Payer: Self-pay | Admitting: Adult Health

## 2019-09-23 NOTE — Telephone Encounter (Signed)
Needs a1c and fu

## 2019-09-24 ENCOUNTER — Other Ambulatory Visit: Payer: Self-pay | Admitting: Family Medicine

## 2019-09-24 DIAGNOSIS — R7302 Impaired glucose tolerance (oral): Secondary | ICD-10-CM

## 2019-09-24 NOTE — Telephone Encounter (Signed)
Pt scheduled and medication sent to the pharmacy.

## 2019-09-28 ENCOUNTER — Other Ambulatory Visit: Payer: Medicare Other

## 2019-09-29 ENCOUNTER — Other Ambulatory Visit (INDEPENDENT_AMBULATORY_CARE_PROVIDER_SITE_OTHER): Payer: Medicare Other

## 2019-09-29 ENCOUNTER — Other Ambulatory Visit: Payer: Self-pay

## 2019-09-29 DIAGNOSIS — R7302 Impaired glucose tolerance (oral): Secondary | ICD-10-CM

## 2019-09-29 LAB — HEMOGLOBIN A1C: Hgb A1c MFr Bld: 5.9 % (ref 4.6–6.5)

## 2019-09-30 ENCOUNTER — Telehealth (INDEPENDENT_AMBULATORY_CARE_PROVIDER_SITE_OTHER): Payer: Medicare Other | Admitting: Adult Health

## 2019-09-30 DIAGNOSIS — R7302 Impaired glucose tolerance (oral): Secondary | ICD-10-CM | POA: Diagnosis not present

## 2019-09-30 NOTE — Progress Notes (Signed)
Virtual Visit via Video Note  I connected with Monique Miller on 09/30/19 at 11:00 AM EST by a video enabled telemedicine application and verified that I am speaking with the correct person using two identifiers.  Location patient: home Location provider:work or home office Persons participating in the virtual visit: patient, provider  I discussed the limitations of evaluation and management by telemedicine and the availability of in person appointments. The patient expressed understanding and agreed to proceed.   HPI: 68 year old female who being evaluated today for follow-up regarding prediabetes.  He is currently maintained on metformin 250 mg daily.  She has been walking 5 days a week and working on cutting out carbs and sugars.  Her last A1c was 6.3.  She denies side effects of metformin such as nausea, vomiting, or diarrhea.  She is not feeling as though her blood sugars are dropping too low.   ROS: See pertinent positives and negatives per HPI.  Past Medical History:  Diagnosis Date  . Allergy   . Anemia    "many years ago"  . Anxiety   . Arthritis   . Cancer ([place])    skin cancer (basal cell)  . Constipation by delayed colonic transit   . Coronary artery disease   . Depression    see [facility] Child psychotherapist and Trey Paula  . Dyspnea    occ  . Grover's disease   . Headache   . Hx of migraines   . NSTEMI (non-ST elevated myocardial infarction) ([place]) 02/2016  . Pneumonia    walking pneumonia  . Restless leg syndrome   . Thyroid disease    hx of hypothyroid    Past Surgical History:  Procedure Laterality Date  . APPENDECTOMY    . AUGMENTATION MAMMAPLASTY Bilateral   . BACK SURGERY     L4-L5  . BREAST SURGERY     augmentation  . CARDIAC CATHETERIZATION N/A 03/08/2016   Procedure: Left Heart Cath and Coronary Angiography;  Surgeon: Adrian Prows, MD;  Location: [facility];  Service: Cardiovascular;  Laterality: N/A;  . CARDIAC CATHETERIZATION N/A 03/08/2016   Procedure:  Coronary Stent Intervention;  Surgeon: Adrian Prows, MD;  Location: [facility];  Service: Cardiovascular;  Laterality: N/A;  Resolute 3.5x15  . CARDIAC CATHETERIZATION N/A 03/08/2016   Procedure: Intravascular Ultrasound/IVUS;  Surgeon: Adrian Prows, MD;  Location: [facility];  Service: Cardiovascular;  Laterality: N/A;  RCA MID  . CESAREAN SECTION     x2  . COLONOSCOPY    . CORONARY ANGIOGRAM  03/08/2016  . CORONARY ANGIOPLASTY     DES LAD, DES D1 02/16/16 Bartolo Darter, [place]), DES RCA 03/08/16 [facility])  . LAMINECTOMY WITH POSTERIOR LATERAL ARTHRODESIS LEVEL 2 N/A 03/07/2017   Procedure: Lumbar Four-Five, Lumbar Five-Sacral One Posterior Lateral Fusion with Removal of pedicle screws;  Surgeon: Eustace Moore, MD;  Location: [facility];  Service: Neurosurgery;  Laterality: N/A;    Family History  Problem Relation Age of Onset  . Cancer Mother        pancreatic  . Cancer Maternal Uncle        pancreatic  . Cancer Maternal Grandmother        pancreatic  . Breast cancer Neg Hx      Current Outpatient Medications:  .  aspirin 81 MG tablet, Take 81 mg by mouth daily. , Disp: , Rfl:  .  FLUOXETINE HCL PO, Take 80 mg by mouth every evening. , Disp: , Rfl: 2 .  LORazepam (  ATIVAN) 1 MG tablet, TAKE 1 TABLET BY MOUTH AT 7PM, Disp: , Rfl: 0 .  Melatonin 10 MG TABS, Take 10 mg by mouth at bedtime., Disp: , Rfl:  .  metFORMIN (GLUCOPHAGE) 500 MG tablet, TAKE 1/2 TABLET BY MOUTH DAILY WITH BREAKFAST., Disp: 45 tablet, Rfl: 0 .  niacin (NIASPAN) 1000 MG CR tablet, Take 1 tablet (1,000 mg total) by mouth 2 (two) times daily., Disp: 180 tablet, Rfl: 3 .  Probiotic Product (PROBIOTIC DAILY PO), Take 1 capsule by mouth daily., Disp: , Rfl:  .  ramipril (ALTACE) 5 MG capsule, TAKE 1 BY MOUTH CAPSULE EVERY EVENING AFTER DINNER, Disp: 90 capsule, Rfl: 1 .  rosuvastatin (CRESTOR) 20 MG tablet, Take 1 tablet (20 mg total) by mouth daily., Disp: 90 tablet, Rfl: 3 .  tolterodine (DETROL) 2 MG tablet, Take 2 mg by  mouth 2 (two) times daily., Disp: , Rfl:   EXAM:  VITALS per patient if applicable:  GENERAL: alert, oriented, appears well and in no acute distress  HEENT: atraumatic, conjunttiva clear, no obvious abnormalities on inspection of external nose and ears  NECK: normal movements of the head and neck  LUNGS: on inspection no signs of respiratory distress, breathing rate appears normal, no obvious gross SOB, gasping or wheezing  CV: no obvious cyanosis  MS: moves all visible extremities without noticeable abnormality  PSYCH/NEURO: pleasant and cooperative, no obvious depression or anxiety, speech and thought processing grossly intact  ASSESSMENT AND PLAN:  Discussed the following assessment and plan:  1. Impaired glucose tolerance -A1c of 5.9. -Continue on metformin 250 mg daily -Continue to work on lifestyle modification -We will touch base in 6 months     I discussed the assessment and treatment plan with the patient. The patient was provided an opportunity to ask questions and all were answered. The patient agreed with the plan and demonstrated an understanding of the instructions.   The patient was advised to call back or seek an in-person evaluation if the symptoms worsen or if the condition fails to improve as anticipated.   [name], NP

## 2019-10-12 ENCOUNTER — Encounter: Payer: Self-pay | Admitting: Adult Health

## 2019-10-30 ENCOUNTER — Encounter: Payer: Self-pay | Admitting: Adult Health

## 2019-11-16 ENCOUNTER — Other Ambulatory Visit: Payer: Self-pay | Admitting: Adult Health

## 2019-12-01 ENCOUNTER — Encounter: Payer: Self-pay | Admitting: Adult Health

## 2019-12-02 ENCOUNTER — Ambulatory Visit: Payer: Medicare PPO | Attending: Internal Medicine

## 2019-12-02 DIAGNOSIS — Z23 Encounter for immunization: Secondary | ICD-10-CM | POA: Insufficient documentation

## 2019-12-02 NOTE — Progress Notes (Signed)
   Covid-19 Vaccination Clinic  Name:  Monique Miller    MRN: [id number] DOB: Nov 17, 1950  12/02/2019  [name] was observed post Covid-19 immunization for 15 minutes without incidence. She was provided with Vaccine Information Sheet and instruction to access the V-Safe system.   [name] was instructed to call 911 with any severe reactions post vaccine: Marland Kitchen Difficulty breathing  . Swelling of your face and throat  . A fast heartbeat  . A bad rash all over your body  . Dizziness and weakness    Immunizations Administered    Name Date Dose VIS Date Route   Pfizer COVID-19 Vaccine 12/02/2019  5:40 PM 0.3 mL 10/23/2019 Intramuscular   Manufacturer: [place]   Lot: [id number]   [place]: [id number]

## 2019-12-21 ENCOUNTER — Other Ambulatory Visit: Payer: Self-pay | Admitting: Adult Health

## 2019-12-23 ENCOUNTER — Ambulatory Visit: Payer: Medicare PPO | Attending: Internal Medicine

## 2019-12-23 DIAGNOSIS — Z23 Encounter for immunization: Secondary | ICD-10-CM

## 2019-12-23 NOTE — Progress Notes (Signed)
   Covid-19 Vaccination Clinic  Name:  Monique Miller    MRN: [id number] DOB: 1951/09/17  12/23/2019  [name] was observed post Covid-19 immunization for 15 minutes without incidence. She was provided with Vaccine Information Sheet and instruction to access the V-Safe system.   [name] was instructed to call 911 with any severe reactions post vaccine: Marland Kitchen Difficulty breathing  . Swelling of your face and throat  . A fast heartbeat  . A bad rash all over your body  . Dizziness and weakness    Immunizations Administered    Name Date Dose VIS Date Route   Pfizer COVID-19 Vaccine 12/23/2019 10:12 AM 0.3 mL 10/23/2019 Intramuscular   Manufacturer: [employer]   Lot: [id number]   [place]: [id number]

## 2019-12-28 ENCOUNTER — Ambulatory Visit: Payer: Medicare Other

## 2020-01-21 ENCOUNTER — Other Ambulatory Visit: Payer: Self-pay | Admitting: Adult Health

## 2020-01-21 DIAGNOSIS — Z1231 Encounter for screening mammogram for malignant neoplasm of breast: Secondary | ICD-10-CM

## 2020-02-14 ENCOUNTER — Other Ambulatory Visit: Payer: Self-pay | Admitting: Cardiology

## 2020-02-14 DIAGNOSIS — I2510 Atherosclerotic heart disease of native coronary artery without angina pectoris: Secondary | ICD-10-CM

## 2020-02-18 ENCOUNTER — Ambulatory Visit: Payer: Medicare PPO

## 2020-02-23 ENCOUNTER — Other Ambulatory Visit: Payer: Self-pay | Admitting: Cardiology

## 2020-03-02 ENCOUNTER — Other Ambulatory Visit: Payer: Self-pay

## 2020-03-02 ENCOUNTER — Ambulatory Visit
Admission: RE | Admit: 2020-03-02 | Discharge: 2020-03-02 | Disposition: A | Discharge status: Home / Self Care | Payer: Medicare PPO | Source: Ambulatory Visit | Attending: Adult Health | Admitting: Adult Health

## 2020-03-02 DIAGNOSIS — Z1231 Encounter for screening mammogram for malignant neoplasm of breast: Secondary | ICD-10-CM

## 2020-05-02 ENCOUNTER — Encounter: Payer: Self-pay | Admitting: Adult Health

## 2020-05-10 MED ORDER — CELECOXIB 200 MG PO CAPS: 200.0000 mg | ORAL_CAPSULE | Freq: Every day | ORAL | 0 refills | Status: DC

## 2020-05-11 ENCOUNTER — Other Ambulatory Visit: Payer: Self-pay | Admitting: Adult Health

## 2020-05-12 NOTE — Telephone Encounter (Signed)
DENIED.  PAST DUE FOR A1C FOLLOW UP

## 2020-06-10 ENCOUNTER — Encounter: Payer: Self-pay | Admitting: Adult Health

## 2020-06-26 ENCOUNTER — Other Ambulatory Visit: Payer: Self-pay | Admitting: Adult Health

## 2020-07-04 ENCOUNTER — Encounter: Payer: Self-pay | Admitting: Adult Health

## 2020-07-05 MED ORDER — METFORMIN HCL 500 MG PO TABS: ORAL_TABLET | ORAL | 0 refills | Status: DC

## 2020-07-08 ENCOUNTER — Other Ambulatory Visit: Payer: Self-pay

## 2020-07-08 ENCOUNTER — Ambulatory Visit: Payer: Medicare PPO | Admitting: Adult Health

## 2020-07-08 ENCOUNTER — Encounter: Payer: Self-pay | Admitting: Adult Health

## 2020-07-08 VITALS — BP 100/62 | HR 66 | Temp 98.6°F | Ht 66.0 in | Wt 148.0 lb

## 2020-07-08 DIAGNOSIS — N3281 Overactive bladder: Secondary | ICD-10-CM | POA: Diagnosis not present

## 2020-07-08 DIAGNOSIS — R7303 Prediabetes: Secondary | ICD-10-CM

## 2020-07-08 LAB — POCT GLYCOSYLATED HEMOGLOBIN (HGB A1C): Hemoglobin A1C: 5.7 % — AB (ref 4.0–5.6)

## 2020-07-08 MED ORDER — MIRABEGRON ER 25 MG PO TB24: 25.0000 mg | ORAL_TABLET | Freq: Every day | ORAL | 1 refills | Status: DC

## 2020-07-08 NOTE — Progress Notes (Signed)
Subjective:    Patient ID: Monique Miller, female    DOB: 08-20-1951, 69 y.o.   MRN: [id number]  HPI 69 year old female who  has a past medical history of Allergy, Anemia, Anxiety, Arthritis, Cancer ([place]), Constipation by delayed colonic transit, Coronary artery disease, Depression, Dyspnea, Grover's disease, Headache, migraines, NSTEMI (non-ST elevated myocardial infarction) ([place]) (02/2016), Pneumonia, Restless leg syndrome, and Thyroid disease.  She presents to the office today for follow up regarding glucose intolerance. She is currently prescribed Metformin 250 mg daily. She continues to stay active and eat healthy.  Lab Results  Component Value Date   HGBA1C 5.9 09/29/2019   She has also been on Detrol 2 mg BID  For OAB but does not feel as though it is working well for her. She continues to have frequent urination without other UTI like symptoms. She is wondering if she can try anothermedications   Review of Systems See HPI   Past Medical History:  Diagnosis Date   Allergy    Anemia    "many years ago"   Anxiety    Arthritis    Cancer ([place])    skin cancer (basal cell)   Constipation by delayed colonic transit    Coronary artery disease    Depression    see [facility] Child psychotherapist and Trey Paula   Dyspnea    occ   Grover's disease    Headache    Hx of migraines    NSTEMI (non-ST elevated myocardial infarction) ([place]) 02/2016   Pneumonia    walking pneumonia   Restless leg syndrome    Thyroid disease    hx of hypothyroid    Social History   Socioeconomic History   Marital status: Married    Spouse name: Not on file   Number of children: 2   Years of education: Not on file   Highest education level: Not on file  Occupational History   Not on file  Tobacco Use   Smoking status: Never Smoker   Smokeless tobacco: Never Used  Vaping Use   Vaping Use: Never used  Substance and Sexual Activity   Alcohol use: Yes    Alcohol/week: 7.0 standard  drinks    Types: 7 Glasses of wine per week    Comment: occassionally   Drug use: No   Sexual activity: Not on file  Other Topics Concern   Not on file  Social History Narrative   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    Social Determinants of Health   Financial Resource Strain:    Difficulty of Paying Living Expenses: Not on file  Food Insecurity:    Worried About Charity fundraiser in the Last Year: Not on file   [employer] of Food in the Last Year: Not on file  Transportation Needs:    Lack of Transportation (Medical): Not on file   Lack of Transportation (Non-Medical): Not on file  Physical Activity:    Days of Exercise per Week: Not on file   Minutes of Exercise per Session: Not on file  Stress:    Feeling of Stress : Not on file  Social Connections:    Frequency of Communication with Friends and Family:  Not on file   Frequency of Social Gatherings with Friends and Family: Not on file   Attends Religious Services: Not on file   Active Member of Clubs or Organizations: Not on file   Attends Archivist Meetings: Not on file   Marital Status: Not on file  Intimate Partner Violence:    Fear of Current or Ex-Partner: Not on file   Emotionally Abused: Not on file   Physically Abused: Not on file   Sexually Abused: Not on file    Past Surgical History:  Procedure Laterality Date   APPENDECTOMY     AUGMENTATION MAMMAPLASTY Bilateral    BACK SURGERY     L4-L5   BREAST SURGERY     augmentation   CARDIAC CATHETERIZATION N/A 03/08/2016   Procedure: Left Heart Cath and Coronary Angiography;  Surgeon: Adrian Prows, MD;  Location: [facility];  Service: Cardiovascular;  Laterality: N/A;   CARDIAC CATHETERIZATION N/A 03/08/2016   Procedure: Coronary Stent  Intervention;  Surgeon: Adrian Prows, MD;  Location: [facility];  Service: Cardiovascular;  Laterality: N/A;  Resolute 3.5x15   CARDIAC CATHETERIZATION N/A 03/08/2016   Procedure: Intravascular Ultrasound/IVUS;  Surgeon: Adrian Prows, MD;  Location: [facility];  Service: Cardiovascular;  Laterality: N/A;  RCA MID   CESAREAN SECTION     x2   COLONOSCOPY     CORONARY ANGIOGRAM  03/08/2016   CORONARY ANGIOPLASTY     DES LAD, DES D1 02/16/16 Bartolo Darter, [place]), DES RCA 03/08/16 [facility])   LAMINECTOMY WITH POSTERIOR LATERAL ARTHRODESIS LEVEL 2 N/A 03/07/2017   Procedure: Lumbar Four-Five, Lumbar Five-Sacral One Posterior Lateral Fusion with Removal of pedicle screws;  Surgeon: Eustace Moore, MD;  Location: [facility];  Service: Neurosurgery;  Laterality: N/A;    Family History  Problem Relation Age of Onset   Cancer Mother        pancreatic   Cancer Maternal Uncle        pancreatic   Cancer Maternal Grandmother        pancreatic   Breast cancer Neg Hx     Allergies  Allergen Reactions   No Known Allergies     Current Outpatient Medications on File Prior to Visit  Medication Sig Dispense Refill   aspirin 81 MG tablet Take 81 mg by mouth daily.      celecoxib (CELEBREX) 200 MG capsule Take 1 capsule (200 mg total) by mouth daily. 90 capsule 0   FLUOXETINE HCL PO Take 80 mg by mouth every evening.   2   LORazepam (ATIVAN) 1 MG tablet TAKE 1 TABLET BY MOUTH AT 7PM  0   Melatonin 10 MG TABS Take 10 mg by mouth at bedtime.     metFORMIN (GLUCOPHAGE) 500 MG tablet TAKE 1/2 TABLET BY MOUTH DAILY WITH BREAKFAST. 90 tablet 0   niacin (NIASPAN) 1000 MG CR tablet TAKE 1 TABLET BY MOUTH TWICE A DAY 180 tablet 1   Probiotic Product (PROBIOTIC DAILY PO) Take 1 capsule by mouth daily.     ramipril (ALTACE) 5 MG capsule TAKE 1 CAPSULE BY MOUTH EVERY EVENING AFTER DINNER 90 capsule 1   rosuvastatin (CRESTOR) 20 MG tablet Take 1 tablet (20 mg total) by mouth daily. 90 tablet 3    tolterodine (DETROL) 2 MG tablet TAKE 1 TABLET (2 MG TOTAL) BY MOUTH 2 (TWO) TIMES DAILY. 180 tablet 3   No current facility-administered medications on file prior to visit.    BP 100/62  Pulse 66    Temp 98.6 F (37 C) (Oral)    Ht 5\' 6"  (1.676 m)    Wt 148 lb (67.1 kg)    SpO2 99%    BMI 23.89 kg/m       Objective:   Physical Exam Vitals and nursing note reviewed.  Constitutional:      Appearance: Normal appearance.  Cardiovascular:     Rate and Rhythm: Normal rate and regular rhythm.     Pulses: Normal pulses.     Heart sounds: Normal heart sounds.  Pulmonary:     Effort: Pulmonary effort is normal.     Breath sounds: Normal breath sounds.  Musculoskeletal:        General: Normal range of motion.  Skin:    General: Skin is warm and dry.  Neurological:     General: No focal deficit present.     Mental Status: She is alert and oriented to person, place, and time.       Assessment & Plan:  1. Pre-diabetes  - POCT glycosylated hemoglobin (Hb A1C) - 5.7.  - Continue with metformin 250mg  daily. She does not need a refill at this itme   2. OAB (overactive bladder) - mirabegron ER (MYRBETRIQ) 25 MG TB24 tablet; Take 1 tablet (25 mg total) by mouth daily.  Dispense: 90 tablet; Refill: 1 - If insurance does not cover then we can try Detrol ER   [name], NP

## 2020-07-12 ENCOUNTER — Encounter: Payer: Self-pay | Admitting: Adult Health

## 2020-08-02 ENCOUNTER — Telehealth: Payer: Self-pay | Admitting: Adult Health

## 2020-08-02 NOTE — Telephone Encounter (Signed)
Left message for patient to schedule Annual Wellness Visit.  Please schedule with Nurse Health Advisor Shannon Crews, RN at [facility]  

## 2020-08-04 ENCOUNTER — Other Ambulatory Visit: Payer: Self-pay | Admitting: Cardiology

## 2020-08-04 DIAGNOSIS — E782 Mixed hyperlipidemia: Secondary | ICD-10-CM

## 2020-08-08 ENCOUNTER — Ambulatory Visit (INDEPENDENT_AMBULATORY_CARE_PROVIDER_SITE_OTHER): Payer: Medicare PPO

## 2020-08-08 ENCOUNTER — Other Ambulatory Visit: Payer: Self-pay

## 2020-08-08 DIAGNOSIS — Z0000 Encounter for general adult medical examination without abnormal findings: Secondary | ICD-10-CM

## 2020-08-08 NOTE — Progress Notes (Signed)
Subjective:   Monique Miller is a 69 y.o. female who presents for an Initial Medicare Annual Wellness Visit.  I connected with Monique Miller today by telephone and verified that I am speaking with the correct person using two identifiers. Location patient: home Location provider: work Persons participating in the virtual visit: patient, provider.   I discussed the limitations, risks, security and privacy concerns of performing an evaluation and management service by telephone and the availability of in person appointments. I also discussed with the patient that there may be a patient responsible charge related to this service. The patient expressed understanding and verbally consented to this telephonic visit.    Interactive audio and video telecommunications were attempted between this provider and patient, however failed, due to patient having technical difficulties OR patient did not have access to video capability.  We continued and completed visit with audio only.      Review of Systems    N/A Cardiac Risk Factors include: advanced age (>21men, >17 women);dyslipidemia     Objective:    Today's Vitals   There is no height or weight on file to calculate BMI.  Advanced Directives 08/08/2020 04/01/2017 03/08/2017 03/07/2017 02/27/2017 03/08/2016 08/06/2014  Does Patient Have a Medical Advance Directive? Yes No;Yes Yes Yes Yes Yes Yes  Type of Paramedic of [employer];Living will [place];Living will [place];Living will [place];Living will [place];Living will [place];Living will Living will  Does patient want to make changes to medical advance directive? No - Patient declined - No - Patient declined - No - Patient declined No - Patient declined No - Patient declined  Copy of [place] in Chart? Yes - validated most recent copy scanned in chart (See  row information) No - copy requested No - copy requested Yes No - copy requested No - copy requested No - copy requested    Current Medications (verified) Outpatient Encounter Medications as of 08/08/2020  Medication Sig  . aspirin 81 MG tablet Take 81 mg by mouth daily.   . celecoxib (CELEBREX) 200 MG capsule Take 1 capsule (200 mg total) by mouth daily.  Marland Kitchen FLUOXETINE HCL PO Take 80 mg by mouth every evening.   Marland Kitchen LORazepam (ATIVAN) 1 MG tablet TAKE 1 TABLET BY MOUTH AT 7PM  . Melatonin 10 MG TABS Take 10 mg by mouth at bedtime.  . metFORMIN (GLUCOPHAGE) 500 MG tablet TAKE 1/2 TABLET BY MOUTH DAILY WITH BREAKFAST.  Marland Kitchen mirabegron ER (MYRBETRIQ) 25 MG TB24 tablet Take 1 tablet (25 mg total) by mouth daily.  . niacin (NIASPAN) 1000 MG CR tablet TAKE 1 TABLET BY MOUTH TWICE A DAY  . Probiotic Product (PROBIOTIC DAILY PO) Take 1 capsule by mouth daily.  . ramipril (ALTACE) 5 MG capsule TAKE 1 CAPSULE BY MOUTH EVERY EVENING AFTER DINNER  . rosuvastatin (CRESTOR) 20 MG tablet TAKE 1 TABLET BY MOUTH EVERY DAY  . tolterodine (DETROL) 2 MG tablet TAKE 1 TABLET (2 MG TOTAL) BY MOUTH 2 (TWO) TIMES DAILY. (Patient not taking: Reported on 08/08/2020)   No facility-administered encounter medications on file as of 08/08/2020.    Allergies (verified) No known allergies   History: Past Medical History:  Diagnosis Date  . Allergy   . Anemia    "many years ago"  . Anxiety   . Arthritis   . Cancer ([place])    skin cancer (basal cell)  . Constipation by  delayed colonic transit   . Coronary artery disease   . Depression    see [facility] psychotherapist and Trey Paula  . Dyspnea    occ  . Grover's disease   . Headache   . Hx of migraines   . NSTEMI (non-ST elevated myocardial infarction) ([place]) 02/2016  . Pneumonia    walking pneumonia  . Restless leg syndrome   . Thyroid disease    hx of hypothyroid   Past Surgical History:  Procedure Laterality Date  . APPENDECTOMY    . AUGMENTATION MAMMAPLASTY  Bilateral   . BACK SURGERY     L4-L5  . BREAST SURGERY     augmentation  . CARDIAC CATHETERIZATION N/A 03/08/2016   Procedure: Left Heart Cath and Coronary Angiography;  Surgeon: Adrian Prows, MD;  Location: [facility];  Service: Cardiovascular;  Laterality: N/A;  . CARDIAC CATHETERIZATION N/A 03/08/2016   Procedure: Coronary Stent Intervention;  Surgeon: Adrian Prows, MD;  Location: [facility];  Service: Cardiovascular;  Laterality: N/A;  [place] 3.5x15  . CARDIAC CATHETERIZATION N/A 03/08/2016   Procedure: Intravascular Ultrasound/IVUS;  Surgeon: Adrian Prows, MD;  Location: [facility];  Service: Cardiovascular;  Laterality: N/A;  RCA MID  . CESAREAN SECTION     x2  . COLONOSCOPY    . CORONARY ANGIOGRAM  03/08/2016  . CORONARY ANGIOPLASTY     DES LAD, DES D1 02/16/16 [facility] Darter, [place]), DES RCA 03/08/16 [facility])  . LAMINECTOMY WITH POSTERIOR LATERAL ARTHRODESIS LEVEL 2 N/A 03/07/2017   Procedure: Lumbar Four-Five, Lumbar Five-Sacral One Posterior Lateral Fusion with Removal of pedicle screws;  Surgeon: Eustace Moore, MD;  Location: [facility];  Service: Neurosurgery;  Laterality: N/A;   Family History  Problem Relation Age of Onset  . Cancer Mother        pancreatic  . Cancer Maternal Uncle        pancreatic  . Cancer Maternal Grandmother        pancreatic  . Breast cancer Neg Hx    Social History   Socioeconomic History  . Marital status: Married    Spouse name: Not on file  . Number of children: 2  . Years of education: Not on file  . Highest education level: Not on file  Occupational History  . Not on file  Tobacco Use  . Smoking status: Never Smoker  . Smokeless tobacco: Never Used  Vaping Use  . Vaping Use: Never used  Substance and Sexual Activity  . Alcohol use: Yes    Alcohol/week: 7.0 standard drinks    Types: 7 Glasses of wine per week    Comment: occassionally  . Drug use: No  . Sexual activity: Not on file  Other Topics Concern  . 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    Social Determinants of Health   Financial Resource Strain: Low Risk   . Difficulty of Paying Living Expenses: Not hard at all  Food Insecurity: No Food Insecurity  . Worried About Charity fundraiser in the Last Year: Never true  . Ran Out of Food in the Last Year: Never true  Transportation Needs: No  Transportation Needs  . Lack of Transportation (Medical): No  . Lack of Transportation (Non-Medical): No  Physical Activity: Insufficiently Active  . Days of Exercise per Week: 3 days  . Minutes of Exercise per Session: 40 min  Stress: No Stress Concern Present  . Feeling of Stress : Not at all  Social Connections: Moderately Integrated  . Frequency of Communication with Friends and Family: More than three times a week  . Frequency of Social Gatherings with Friends and Family: Once a week  . Attends Religious Services: More than 4 times per year  . Active Member of Clubs or Organizations: No  . Attends Archivist Meetings: Never  . Marital Status: Married    Tobacco Counseling Counseling given: Not Answered   Clinical Intake:  Pre-visit preparation completed: Yes  Pain : No/denies pain     Nutritional Risks: None Diabetes: No (Patient is prediabetic but does not check glucose at home)  How often do you need to have someone help you when you read instructions, pamphlets, or other written materials from your doctor or pharmacy?: 1 - Never What is the last grade level you completed in school?: Master's Degree  Diabetic?No, Prediabetic   Interpreter Needed?: No  Information entered by :: [place] of Daily Living In your present state of health, do you have any difficulty performing the following activities:  08/08/2020  Hearing? Y  Comment has bilateral hearing aids  Vision? N  Difficulty concentrating or making decisions? N  Walking or climbing stairs? N  Dressing or bathing? N  Doing errands, shopping? N  Preparing Food and eating ? N  Using the Toilet? N  In the past six months, have you accidently leaked urine? Y  Comment has occassional bladder leakage take Mybetriq which helps  Do you have problems with loss of bowel control? N  Managing your Medications? N  Managing your Finances? N  Housekeeping or managing your Housekeeping? N  Some recent data might be hidden    Patient Care Team: [name], NP as PCP - General (Family Medicine) Adrian Prows, MD as Consulting Physician (Cardiology) Almedia Balls, MD as Referring Physician (Orthopedic Surgery)  Indicate any recent Medical Services you may have received from other than [facility] providers in the past year (date may be approximate).     Assessment:   This is a routine wellness examination for [place].  Hearing/Vision screen  Hearing Screening   125Hz  250Hz  500Hz  1000Hz  2000Hz  3000Hz  4000Hz  6000Hz  8000Hz   Right ear:           Left ear:           Vision Screening Comments: Patient states gets eyes checked annually   Dietary issues and exercise activities discussed: Current Exercise Habits: The patient does not participate in regular exercise at present  Goals    . Patient Stated     I will continue to walk 45 minutes 4-5 times per week      Depression Screen PHQ 2/9 Scores 08/08/2020  PHQ - 2 Score 0  PHQ- 9 Score 0    Fall Risk Fall Risk  08/08/2020 04/12/2016  Falls in the past year? 1 No  Number falls in past yr: 0 -  Comment fell out of a chair outside, no injury -  Injury with Fall? 0 -  Risk for fall due to : Medication side effect -  Follow up Falls evaluation completed;Falls prevention discussed -    Any stairs in or around the  home? Yes  If so, are there any without handrails? No  Home free of loose  throw rugs in walkways, pet beds, electrical cords, etc? Yes  Adequate lighting in your home to reduce risk of falls? Yes   ASSISTIVE DEVICES UTILIZED TO PREVENT FALLS:  Life alert? No  Use of a cane, walker or w/c? No  Grab bars in the bathroom? No  Shower chair or bench in shower? Yes  Elevated toilet seat or a handicapped toilet? No   Cognitive Function:  Cognitive screening not indicated based on direct observation       Immunizations Immunization History  Administered Date(s) Administered  . Influenza, Quadrivalent, Recombinant, Inj, Pf 08/27/2019  . Influenza,inj,Quad PF,6+ Mos 08/29/2017, 08/26/2018  . PFIZER SARS-COV-2 Vaccination 12/02/2019, 12/23/2019  . Pneumococcal Polysaccharide-23 08/26/2018, 08/26/2018  . Tdap 01/01/2019  . Zoster Recombinat (Shingrix) 09/26/2018, 12/25/2018    TDAP status: Up to date Flu Vaccine status: Up to date Pneumococcal vaccine status: Declined,  Education has been provided regarding the importance of this vaccine but patient still declined. Advised may receive this vaccine at local pharmacy or Health Dept. Aware to provide a copy of the vaccination record if obtained from local pharmacy or Health Dept. Verbalized acceptance and understanding.  Covid-19 vaccine status: Completed vaccines  Qualifies for Shingles Vaccine? Yes   Zostavax completed No   Shingrix Completed?: Yes  Screening Tests Health Maintenance  Topic Date Due  . PNA vac Low Risk Adult (1 of 2 - PCV13) 08/27/2019  . INFLUENZA VACCINE  06/12/2020  . MAMMOGRAM  03/02/2022  . COLONOSCOPY  09/25/2023  . TETANUS/TDAP  01/01/2029  . DEXA SCAN  Completed  . COVID-19 Vaccine  Completed  . Hepatitis C Screening  Completed    Health Maintenance  Health Maintenance Due  Topic Date Due  . PNA vac Low Risk Adult (1 of 2 - PCV13) 08/27/2019  . INFLUENZA VACCINE  06/12/2020    Colorectal cancer screening: Completed 09/24/2013. Repeat every 10 years Mammogram status:  Completed 03/02/2020. Repeat every year Bone Density status: Completed 09/22/2018. Results reflect: Bone density results: OSTEOPENIA. Repeat every 2 years.  Lung Cancer Screening: (Low Dose CT Chest recommended if Age 48-80 years, 30 pack-year currently smoking OR have quit w/in 15years.) does not qualify.   Lung Cancer Screening Referral: N/A  Additional Screening:  Hepatitis C Screening: does qualify; Completed 09/18/2018   Vision Screening: Recommended annual ophthalmology exams for early detection of glaucoma and other disorders of the eye. Is the patient up to date with their annual eye exam?  Yes  Who is the provider or what is the name of the office in which the patient attends annual eye exams? [facility]  If pt is not established with a provider, would they like to be referred to a provider to establish care? No .   Dental Screening: Recommended annual dental exams for proper oral hygiene  Community Resource Referral / Chronic Care Management: CRR required this visit?  No   CCM required this visit?  No      Plan:     I have personally reviewed and noted the following in the patient's chart:   . Medical and social history . Use of alcohol, tobacco or illicit drugs  . Current medications and supplements . Functional ability and status . Nutritional status . Physical activity . Advanced directives . List of other physicians . Hospitalizations, surgeries, and ER visits in previous 12 months . Vitals . Screenings to include cognitive,  depression, and falls . Referrals and appointments  In addition, I have reviewed and discussed with patient certain preventive protocols, quality metrics, and best practice recommendations. A written personalized care plan for preventive services as well as general preventive health recommendations were provided to patient.     Ofilia Neas, LPN   6/33/3545   Nurse Notes: None

## 2020-08-08 NOTE — Patient Instructions (Signed)
Monique Miller , Thank you for taking time to come for your Medicare Wellness Visit. I appreciate your ongoing commitment to your health goals. Please review the following plan we discussed and let me know if I can assist you in the future.   Screening recommendations/referrals: Colonoscopy: Up to date, next due 09/25/2023 Mammogram: Up to date, next due 03/02/2021 Bone Density: Up to date, next due 09/22/2020 Recommended yearly ophthalmology/optometry visit for glaucoma screening and checkup Recommended yearly dental visit for hygiene and checkup  Vaccinations: Influenza vaccine: Currently due, you may receive at your next in person office visit or at your local pharmacy  Pneumococcal vaccine: Currently due for Prevnar 13, you may receive at your next in person office visit Tdap vaccine: Up to date next due 01/01/2029 Shingles vaccine: Completed series    Advanced directives: Copies on file  Conditions/risks identified: None   Next appointment: None    Preventive Care 3 Years and Older, Female Preventive care refers to lifestyle choices and visits with your health care provider that can promote health and wellness. What does preventive care include?  A yearly physical exam. This is also called an annual well check.  Dental exams once or twice a year.  Routine eye exams. Ask your health care provider how often you should have your eyes checked.  Personal lifestyle choices, including:  Daily care of your teeth and gums.  Regular physical activity.  Eating a healthy diet.  Avoiding tobacco and drug use.  Limiting alcohol use.  Practicing safe sex.  Taking low-dose aspirin every day.  Taking vitamin and mineral supplements as recommended by your health care provider. What happens during an annual well check? The services and screenings done by your health care provider during your annual well check will depend on your age, overall health, lifestyle risk factors, and  family history of disease. Counseling  Your health care provider may ask you questions about your:  Alcohol use.  Tobacco use.  Drug use.  Emotional well-being.  Home and relationship well-being.  Sexual activity.  Eating habits.  History of falls.  Memory and ability to understand (cognition).  Work and work Statistician.  Reproductive health. Screening  You may have the following tests or measurements:  Height, weight, and BMI.  Blood pressure.  Lipid and cholesterol levels. These may be checked every 5 years, or more frequently if you are over 49 years old.  Skin check.  Lung cancer screening. You may have this screening every year starting at age 78 if you have a 30-pack-year history of smoking and currently smoke or have quit within the past 15 years.  Fecal occult blood test (FOBT) of the stool. You may have this test every year starting at age 28.  Flexible sigmoidoscopy or colonoscopy. You may have a sigmoidoscopy every 5 years or a colonoscopy every 10 years starting at age 16.  Hepatitis C blood test.  Hepatitis B blood test.  Sexually transmitted disease (STD) testing.  Diabetes screening. This is done by checking your blood sugar (glucose) after you have not eaten for a while (fasting). You may have this done every 1-3 years.  Bone density scan. This is done to screen for osteoporosis. You may have this done starting at age 29.  Mammogram. This may be done every 1-2 years. Talk to your health care provider about how often you should have regular mammograms. Talk with your health care provider about your test results, treatment options, and if necessary, the need for more  tests. Vaccines  Your health care provider may recommend certain vaccines, such as:  Influenza vaccine. This is recommended every year.  Tetanus, diphtheria, and acellular pertussis (Tdap, Td) vaccine. You may need a Td booster every 10 years.  Zoster vaccine. You may need this  after age 55.  Pneumococcal 13-valent conjugate (PCV13) vaccine. One dose is recommended after age 66.  Pneumococcal polysaccharide (PPSV23) vaccine. One dose is recommended after age 9. Talk to your health care provider about which screenings and vaccines you need and how often you need them. This information is not intended to replace advice given to you by your health care provider. Make sure you discuss any questions you have with your health care provider. Document Released: 11/25/2015 Document Revised: 07/18/2016 Document Reviewed: 08/30/2015 Elsevier Interactive Patient Education  2017 [place] Prevention in the Home Falls can cause injuries. They can happen to people of all ages. There are many things you can do to make your home safe and to help prevent falls. What can I do on the outside of my home?  Regularly fix the edges of walkways and driveways and fix any cracks.  Remove anything that might make you trip as you walk through a door, such as a raised step or threshold.  Trim any bushes or trees on the path to your home.  Use bright outdoor lighting.  Clear any walking paths of anything that might make someone trip, such as rocks or tools.  Regularly check to see if handrails are loose or broken. Make sure that both sides of any steps have handrails.  Any raised decks and porches should have guardrails on the edges.  Have any leaves, snow, or ice cleared regularly.  Use sand or salt on walking paths during winter.  Clean up any spills in your garage right away. This includes oil or grease spills. What can I do in the bathroom?  Use night lights.  Install grab bars by the toilet and in the tub and shower. Do not use towel bars as grab bars.  Use non-skid mats or decals in the tub or shower.  If you need to sit down in the shower, use a plastic, non-slip stool.  Keep the floor dry. Clean up any water that spills on the floor as soon as it  happens.  Remove soap buildup in the tub or shower regularly.  Attach bath mats securely with double-sided non-slip rug tape.  Do not have throw rugs and other things on the floor that can make you trip. What can I do in the bedroom?  Use night lights.  Make sure that you have a light by your bed that is easy to reach.  Do not use any sheets or blankets that are too big for your bed. They should not hang down onto the floor.  Have a firm chair that has side arms. You can use this for support while you get dressed.  Do not have throw rugs and other things on the floor that can make you trip. What can I do in the kitchen?  Clean up any spills right away.  Avoid walking on wet floors.  Keep items that you use a lot in easy-to-reach places.  If you need to reach something above you, use a strong step stool that has a grab bar.  Keep electrical cords out of the way.  Do not use floor polish or wax that makes floors slippery. If you must use wax, use non-skid floor  wax.  Do not have throw rugs and other things on the floor that can make you trip. What can I do with my stairs?  Do not leave any items on the stairs.  Make sure that there are handrails on both sides of the stairs and use them. Fix handrails that are broken or loose. Make sure that handrails are as long as the stairways.  Check any carpeting to make sure that it is firmly attached to the stairs. Fix any carpet that is loose or worn.  Avoid having throw rugs at the top or bottom of the stairs. If you do have throw rugs, attach them to the floor with carpet tape.  Make sure that you have a light switch at the top of the stairs and the bottom of the stairs. If you do not have them, ask someone to add them for you. What else can I do to help prevent falls?  Wear shoes that:  Do not have high heels.  Have rubber bottoms.  Are comfortable and fit you well.  Are closed at the toe. Do not wear sandals.  If you  use a stepladder:  Make sure that it is fully opened. Do not climb a closed stepladder.  Make sure that both sides of the stepladder are locked into place.  Ask someone to hold it for you, if possible.  Clearly mark and make sure that you can see:  Any grab bars or handrails.  First and last steps.  Where the edge of each step is.  Use tools that help you move around (mobility aids) if they are needed. These include:  Canes.  Walkers.  Scooters.  Crutches.  Turn on the lights when you go into a dark area. Replace any light bulbs as soon as they burn out.  Set up your furniture so you have a clear path. Avoid moving your furniture around.  If any of your floors are uneven, fix them.  If there are any pets around you, be aware of where they are.  Review your medicines with your doctor. Some medicines can make you feel dizzy. This can increase your chance of falling. Ask your doctor what other things that you can do to help prevent falls. This information is not intended to replace advice given to you by your health care provider. Make sure you discuss any questions you have with your health care provider. Document Released: 08/25/2009 Document Revised: 04/05/2016 Document Reviewed: 12/03/2014 Elsevier Interactive Patient Education  2017 [employer].

## 2020-08-16 ENCOUNTER — Encounter: Payer: Self-pay | Admitting: Adult Health

## 2020-08-16 ENCOUNTER — Other Ambulatory Visit: Payer: Self-pay | Admitting: Cardiology

## 2020-08-16 ENCOUNTER — Telehealth: Payer: Self-pay

## 2020-08-16 DIAGNOSIS — R739 Hyperglycemia, unspecified: Secondary | ICD-10-CM

## 2020-08-16 DIAGNOSIS — I2510 Atherosclerotic heart disease of native coronary artery without angina pectoris: Secondary | ICD-10-CM

## 2020-08-16 DIAGNOSIS — E782 Mixed hyperlipidemia: Secondary | ICD-10-CM

## 2020-08-16 NOTE — Telephone Encounter (Signed)
Patient called regarding her upcoming ov questioning if she needed to get any labs done before she comes in I didn't see no lab orders that have been dropped please advise

## 2020-08-16 NOTE — Telephone Encounter (Signed)
Spoke to patient, patient is aware

## 2020-08-16 NOTE — Telephone Encounter (Signed)
Orders placed, ask if she got My Chart message I sent today and if she did, encourage her to use Mychart messaging

## 2020-08-18 ENCOUNTER — Other Ambulatory Visit: Payer: Self-pay | Admitting: Cardiology

## 2020-08-18 DIAGNOSIS — I2510 Atherosclerotic heart disease of native coronary artery without angina pectoris: Secondary | ICD-10-CM

## 2020-08-18 LAB — CMP14+EGFR
ALT: 48 IU/L — ABNORMAL HIGH (ref 0–32)
AST: 52 IU/L — ABNORMAL HIGH (ref 0–40)
Albumin/Globulin Ratio: 1.9 (ref 1.2–2.2)
Albumin: 4.1 g/dL (ref 3.8–4.8)
Alkaline Phosphatase: 65 IU/L (ref 44–121)
BUN/Creatinine Ratio: 23 (ref 12–28)
BUN: 22 mg/dL (ref 8–27)
Bilirubin Total: 0.4 mg/dL (ref 0.0–1.2)
CO2: 24 mmol/L (ref 20–29)
Calcium: 9.3 mg/dL (ref 8.7–10.3)
Chloride: 103 mmol/L (ref 96–106)
Creatinine, Ser: 0.95 mg/dL (ref 0.57–1.00)
GFR calc Af Amer: 72 mL/min/{1.73_m2} (ref >59)
GFR calc non Af Amer: 63 mL/min/{1.73_m2} (ref >59)
Globulin, Total: 2.2 g/dL (ref 1.5–4.5)
Glucose: 95 mg/dL (ref 65–99)
Potassium: 4.8 mmol/L (ref 3.5–5.2)
Sodium: 140 mmol/L (ref 134–144)
Total Protein: 6.3 g/dL (ref 6.0–8.5)

## 2020-08-18 LAB — LIPID PANEL WITH LDL/HDL RATIO
Cholesterol, Total: 141 mg/dL (ref 100–199)
HDL: 72 mg/dL (ref >39)
LDL Chol Calc (NIH): 60 mg/dL (ref 0–99)
LDL/HDL Ratio: 0.8 ratio (ref 0.0–3.2)
Triglycerides: 33 mg/dL (ref 0–149)
VLDL Cholesterol Cal: 9 mg/dL (ref 5–40)

## 2020-08-18 LAB — CBC
Hematocrit: 35.7 % (ref 34.0–46.6)
Hemoglobin: 11.1 g/dL (ref 11.1–15.9)
MCH: 25.8 pg — ABNORMAL LOW (ref 26.6–33.0)
MCHC: 31.1 g/dL — ABNORMAL LOW (ref 31.5–35.7)
MCV: 83 fL (ref 79–97)
Platelets: 186 10*3/uL (ref 150–450)
RBC: 4.3 x10E6/uL (ref 3.77–5.28)
RDW: 15.7 % — ABNORMAL HIGH (ref 11.7–15.4)
WBC: 7 10*3/uL (ref 3.4–10.8)

## 2020-08-18 LAB — HGB A1C W/O EAG: Hgb A1c MFr Bld: 6.2 % — ABNORMAL HIGH (ref 4.8–5.6)

## 2020-08-24 ENCOUNTER — Ambulatory Visit: Payer: Medicare PPO | Admitting: Cardiology

## 2020-08-24 ENCOUNTER — Other Ambulatory Visit: Payer: Self-pay

## 2020-08-24 ENCOUNTER — Encounter: Payer: Self-pay | Admitting: Cardiology

## 2020-08-24 VITALS — BP 122/61 | HR 58 | Ht 66.0 in | Wt 149.0 lb

## 2020-08-24 DIAGNOSIS — R002 Palpitations: Secondary | ICD-10-CM

## 2020-08-24 DIAGNOSIS — E782 Mixed hyperlipidemia: Secondary | ICD-10-CM

## 2020-08-24 DIAGNOSIS — R739 Hyperglycemia, unspecified: Secondary | ICD-10-CM

## 2020-08-24 DIAGNOSIS — I2510 Atherosclerotic heart disease of native coronary artery without angina pectoris: Secondary | ICD-10-CM

## 2020-08-24 DIAGNOSIS — R0609 Other forms of dyspnea: Secondary | ICD-10-CM

## 2020-08-24 DIAGNOSIS — E7841 Elevated Lipoprotein(a): Secondary | ICD-10-CM

## 2020-08-24 MED ORDER — METOPROLOL SUCCINATE ER 25 MG PO TB24: 25.0000 mg | ORAL_TABLET | Freq: Every day | ORAL | 2 refills | Status: DC

## 2020-08-24 NOTE — Progress Notes (Signed)
Primary Physician/Referring:  [name], NP  Patient ID: Monique Miller, female    DOB: February 19, 1951, 69 y.o.   MRN: [id number]  Chief Complaint  Patient presents with  . Coronary Artery Disease  . Follow-up   HPI:    Monique Miller  is a 69 y.o. coronary artery disease with NSTEMI and underwent PCI to LAD and D1 with the 2.75 x 23 mm Xience DES and 2.5 x 15 mm Xience DES respectively in [place], [place] for occluded diagonal and mid LAD in 2016, IVUS guided PCI to the RCA with a 3.5x15 mm Resolute DES on 03/08/2016.   She had normal LDL but markedly elevated LPA and hence is presently on high-dose statin along with niacin which is tolerating. She does have a family history of premature CAD as her brother had an MI at age 27. she also has multiple second-degree relatives with CAD as well.    Her main complaint is dizziness when she suddenly gets up that has become lifestyle limiting and she is also noticing dyspnea when she does physical activity and feels like she is sitting against a wall and cannot go beyond it.  She has not had any chest discomfort, no PND or orthopnea, no leg edema.  Past Medical History:  Diagnosis Date  . Allergy   . Anemia    "many years ago"  . Anxiety   . Arthritis   . Cancer ([place])    skin cancer (basal cell)  . Constipation by delayed colonic transit   . Coronary artery disease   . Depression    see Meridith Child psychotherapist and Trey Paula  . Dyspnea    occ  . Grover's disease   . Headache   . Hx of migraines   . NSTEMI (non-ST elevated myocardial infarction) ([place]) 02/2016  . Pneumonia    walking pneumonia  . Restless leg syndrome   . Thyroid disease    hx of hypothyroid   Past Surgical History:  Procedure Laterality Date  . APPENDECTOMY    . AUGMENTATION MAMMAPLASTY Bilateral   . BACK SURGERY     L4-L5  . BREAST SURGERY     augmentation  . CARDIAC CATHETERIZATION N/A 03/08/2016   Procedure: Left Heart Cath and Coronary Angiography;   Surgeon: Adrian Prows, MD;  Location: [facility];  Service: Cardiovascular;  Laterality: N/A;  . CARDIAC CATHETERIZATION N/A 03/08/2016   Procedure: Coronary Stent Intervention;  Surgeon: Adrian Prows, MD;  Location: [facility];  Service: Cardiovascular;  Laterality: N/A;  Resolute 3.5x15  . CARDIAC CATHETERIZATION N/A 03/08/2016   Procedure: Intravascular Ultrasound/IVUS;  Surgeon: Adrian Prows, MD;  Location: [facility];  Service: Cardiovascular;  Laterality: N/A;  RCA MID  . CESAREAN SECTION     x2  . COLONOSCOPY    . CORONARY ANGIOGRAM  03/08/2016  . CORONARY ANGIOPLASTY     DES LAD, DES D1 02/16/16 [facility], [place]), DES RCA 03/08/16 [facility])  . LAMINECTOMY WITH POSTERIOR LATERAL ARTHRODESIS LEVEL 2 N/A 03/07/2017   Procedure: Lumbar Four-Five, Lumbar Five-Sacral One Posterior Lateral Fusion with Removal of pedicle screws;  Surgeon: Eustace Moore, MD;  Location: [place];  Service: Neurosurgery;  Laterality: N/A;   Social History   Tobacco Use  . Smoking status: Never Smoker  . Smokeless tobacco: Never Used  Substance Use Topics  . Alcohol use: Yes    Alcohol/week: 7.0 standard drinks    Types: 7 Glasses of wine per week  Comment: occassionally  Marital Status: Married    ROS  Review of Systems  Cardiovascular: Negative for chest pain, dyspnea on exertion and leg swelling.  Gastrointestinal: Negative for melena.   Objective   Vitals with BMI 08/24/2020 08/08/2020 07/08/2020  Height 5' 6"  - 5' 6"   Weight 149 lbs (No Data) 148 lbs  BMI 17.61 - 60.7  Systolic 371 (No Data) 062  Diastolic 61 (No Data) 62  Pulse 58 (No Data) 66    Blood pressure 122/61, pulse (!) 58, height 5' 6"  (1.676 m), weight 149 lb (67.6 kg), SpO2 99 %. Body mass index is 24.05 kg/m.   Physical Exam Constitutional:      Comments: She is moderately built and well-nourished in no acute distress.  Neck:     Thyroid: No thyromegaly.     Vascular: No JVD.  Cardiovascular:     Rate and Rhythm:  Normal rate and regular rhythm.     Pulses: Intact distal pulses.     Heart sounds: Normal heart sounds. No murmur heard.  No gallop.      Comments: No leg edema, no JVD. Pulmonary:     Effort: Pulmonary effort is normal.     Breath sounds: Normal breath sounds.  Abdominal:     General: Bowel sounds are normal.     Palpations: Abdomen is soft.  Musculoskeletal:        General: Normal range of motion.     Cervical back: Neck supple.  Skin:    General: Skin is warm and dry.    Radiology: No results found.  Laboratory examination:   10/29/2017: Cholesterol 118, triglycerides 52, HDL 72, LDL 36.  Glucose 110, creatinine 1.12, EGFR 52/60, potassium 4.4, CMP normal.  Lipoprotein a 135.   Ref Range & Units 08/03/2019  Lipoprotein (a) <75.0 nmol/L 120.7High     Recent Labs    08/17/20 0805  NA 140  K 4.8  CL 103  CO2 24  GLUCOSE 95  BUN 22  CREATININE 0.95  CALCIUM 9.3  GFRNONAA 63  GFRAA 72   CMP Latest Ref Rng & Units 08/17/2020 08/03/2019 09/18/2018  Glucose 65 - 99 mg/dL 95 103(H) 111(H)  BUN 8 - 27 mg/dL 22 15 17   Creatinine 0.57 - 1.00 mg/dL 0.95 1.05(H) 0.87  Sodium 134 - 144 mmol/L 140 141 137  Potassium 3.5 - 5.2 mmol/L 4.8 5.0 3.9  Chloride 96 - 106 mmol/L 103 104 99  CO2 20 - 29 mmol/L 24 22 30   Calcium 8.7 - 10.3 mg/dL 9.3 9.2 9.3  Total Protein 6.0 - 8.5 g/dL 6.3 6.2 6.7  Total Bilirubin 0.0 - 1.2 mg/dL 0.4 0.5 0.7  Alkaline Phos 44 - 121 IU/L 65 54 48  AST 0 - 40 IU/L 52(H) 36 24  ALT 0 - 32 IU/L 48(H) 29 18   CBC Latest Ref Rng & Units 08/17/2020 09/18/2018 04/01/2017  WBC 3.4 - 10.8 x10E3/uL 7.0 4.5 5.4  Hemoglobin 11.1 - 15.9 g/dL 11.1 14.3 13.3  Hematocrit 34.0 - 46.6 % 35.7 42.9 40.4  Platelets 150 - 450 x10E3/uL 186 168.0 189   Lipid Panel Recent Labs    08/17/20 0805  CHOL 141  TRIG 33  LDLCALC 60  HDL 72    HEMOGLOBIN A1C Lab Results  Component Value Date   HGBA1C 6.2 (H) 08/17/2020   TSH No results for input(s): TSH in the last  8760 hours. Medications and allergies   No Known Allergies   Current Outpatient Medications  Medication Instructions  . ARIPiprazole (ABILIFY) 2 mg, Oral, Daily  . aspirin 81 mg, Oral, Daily  . calcium-vitamin D (OSCAL WITH D) 250-125 MG-UNIT tablet 1 tablet, Oral, Daily  . celecoxib (CELEBREX) 200 mg, Oral, Daily  . FLUOXETINE HCL PO 80 mg, Oral, Every evening  . LORazepam (ATIVAN) 1 MG tablet TAKE 1 TABLET BY MOUTH AT 7PM  . Melatonin 10 mg, Oral, Daily at bedtime  . metFORMIN (GLUCOPHAGE) 500 MG tablet TAKE 1/2 TABLET BY MOUTH DAILY WITH BREAKFAST.  . metoprolol succinate (TOPROL-XL) 25 mg, Oral, Daily, Take with or immediately following a meal.  . mirabegron ER (MYRBETRIQ) 25 mg, Oral, Daily  . niacin (NIASPAN) 1000 MG CR tablet TAKE 1 TABLET BY MOUTH TWICE A DAY  . Probiotic Product (PROBIOTIC DAILY PO) 1 capsule, Oral, Daily  . rosuvastatin (CRESTOR) 20 MG tablet TAKE 1 TABLET BY MOUTH EVERY DAY   Cardiac Studies:   Coronary angiogram 03/08/2016: PCI to Prox RCA with 3.5x1 mm Resolute. Acute MI 02/16/2016 S/P PCI to LAD and D1 with the 2.75 x 23 mm Xience DES and 2.5 x 15 mm Xience DES respectively. Normal LVEF.  Echocardiogram 02/15/2016: Normal LV systolic function.  Performed in [place], [place]  EKG   EKG 08/24/2020: Sinus bradycardia at rate of 57 bpm, normal axis, no evidence of ischemia, normal EKG. no change from 08/25/2019.  Assessment:     ICD-10-CM   1. Coronary artery disease involving native coronary artery of native heart without angina pectoris  I25.10 EKG 12-Lead    metoprolol succinate (TOPROL-XL) 25 MG 24 hr tablet    PCV ECHOCARDIOGRAM COMPLETE    PCV MYOCARDIAL PERFUSION WO LEXISCAN  2. Dyspnea on exertion  R06.00 PCV ECHOCARDIOGRAM COMPLETE    PCV MYOCARDIAL PERFUSION WO LEXISCAN  3. Palpitations  R00.2   4. Mixed hyperlipidemia  E78.2   5. Hyperglycemia  R73.9   6. Elevated lipoprotein(a)  E78.41     Recommendations:   Monique Miller  is a  69 y.o. coronary artery disease with NSTEMI and underwent PCI to LAD and D1 with the 2.75 x 23 mm Xience DES and 2.5 x 15 mm Xience DES respectively in [place], [place] for occluded diagonal and mid LAD in 2016, IVUS guided PCI to the RCA with a 3.5x15 mm Resolute DES on 03/08/2016.   She had normal LDL but markedly elevated LPA and hence is presently on high-dose statin along with niacin which is tolerating. She does have a family history of premature CAD as her brother had an MI at age 24. she also has multiple second-degree relatives with CAD as well.  She has noticed frequent episodes of marked dizziness to near syncope and also marked dyspnea on exertion recently.  Blood pressure is well controlled, lipids are controlled, LPA has improved on niacin, due to dizziness, may have been contributed by vasodilatory effect, will reduce it to once a day in the evening from twice daily.    I suspect her episodes of dizziness and elevated heart rate and palpitations are related to multiple psychotropic agents that she is on.  I will discontinue ramipril and switch her to metoprolol succinate 25 mg daily.  She does not have hypertension, she is on ramipril in view of hyperglycemia and also known coronary artery disease.  Hopefully this which will make an improvement in her palpitations and also dyspnea.  In view of her dyspnea, as it has been greater than 4 to 5 years since prior evaluations, I set  her up for exercise nuclear stress test along with an echocardiogram.  Unless the stress test and echocardiogram are abnormal I like to see her back in 6 months.  However I also advised her that if her symptoms do not improve irrespective of the test results, that she needs to contact me.  Her husband is present.  All questions answered, labs reviewed, this was a 40-minute encounter in evaluation of complex medical issues and making complex decisions.  I have advised her to discuss with her psychiatrist  regarding making changes to her psychotropic agents.  I will also screen her for "Horizon" LPA trial.       Adrian Prows, MD, [facility] 08/25/2020, 1:11 PM Office: [phone]

## 2020-08-25 MED ORDER — NIACIN ER (ANTIHYPERLIPIDEMIC) 1000 MG PO TBCR: 1000.0000 mg | EXTENDED_RELEASE_TABLET | Freq: Every day | ORAL | 1 refills | Status: DC

## 2020-08-31 ENCOUNTER — Other Ambulatory Visit: Payer: Self-pay

## 2020-08-31 ENCOUNTER — Ambulatory Visit: Payer: Medicare PPO

## 2020-08-31 DIAGNOSIS — I2510 Atherosclerotic heart disease of native coronary artery without angina pectoris: Secondary | ICD-10-CM

## 2020-08-31 DIAGNOSIS — R0609 Other forms of dyspnea: Secondary | ICD-10-CM

## 2020-09-20 DIAGNOSIS — L821 Other seborrheic keratosis: Secondary | ICD-10-CM | POA: Diagnosis not present

## 2020-09-20 DIAGNOSIS — L578 Other skin changes due to chronic exposure to nonionizing radiation: Secondary | ICD-10-CM | POA: Diagnosis not present

## 2020-09-20 DIAGNOSIS — L814 Other melanin hyperpigmentation: Secondary | ICD-10-CM | POA: Diagnosis not present

## 2020-09-20 DIAGNOSIS — D0471 Carcinoma in situ of skin of right lower limb, including hip: Secondary | ICD-10-CM | POA: Diagnosis not present

## 2020-09-20 DIAGNOSIS — L570 Actinic keratosis: Secondary | ICD-10-CM | POA: Diagnosis not present

## 2020-09-20 DIAGNOSIS — D485 Neoplasm of uncertain behavior of skin: Secondary | ICD-10-CM | POA: Diagnosis not present

## 2020-09-20 DIAGNOSIS — D225 Melanocytic nevi of trunk: Secondary | ICD-10-CM | POA: Diagnosis not present

## 2020-09-20 DIAGNOSIS — Z85828 Personal history of other malignant neoplasm of skin: Secondary | ICD-10-CM | POA: Diagnosis not present

## 2020-09-23 ENCOUNTER — Other Ambulatory Visit: Payer: Self-pay | Admitting: Adult Health

## 2020-09-23 NOTE — Telephone Encounter (Signed)
Last OV 07/08/20 Last fill 07/05/20  #90/0

## 2020-09-27 ENCOUNTER — Encounter: Payer: Self-pay | Admitting: Adult Health

## 2020-09-27 ENCOUNTER — Other Ambulatory Visit: Payer: Self-pay

## 2020-09-27 ENCOUNTER — Ambulatory Visit (INDEPENDENT_AMBULATORY_CARE_PROVIDER_SITE_OTHER): Payer: Medicare PPO | Admitting: Adult Health

## 2020-09-27 VITALS — BP 124/76 | HR 60 | Temp 98.4°F | Ht 65.0 in | Wt 150.6 lb

## 2020-09-27 DIAGNOSIS — N3281 Overactive bladder: Secondary | ICD-10-CM

## 2020-09-27 DIAGNOSIS — F32A Depression, unspecified: Secondary | ICD-10-CM

## 2020-09-27 DIAGNOSIS — Z0000 Encounter for general adult medical examination without abnormal findings: Secondary | ICD-10-CM

## 2020-09-27 DIAGNOSIS — I2510 Atherosclerotic heart disease of native coronary artery without angina pectoris: Secondary | ICD-10-CM

## 2020-09-27 DIAGNOSIS — F419 Anxiety disorder, unspecified: Secondary | ICD-10-CM

## 2020-09-27 DIAGNOSIS — R7302 Impaired glucose tolerance (oral): Secondary | ICD-10-CM

## 2020-09-27 NOTE — Progress Notes (Signed)
Subjective:    Patient ID: Monique Miller, female    DOB: January 03, 1951, 69 y.o.   MRN: [id number]  HPI Patient presents for yearly preventative medicine examination. She is a pleasant 69 year old female who  has a past medical history of Allergy, Anemia, Anxiety, Arthritis, Cancer ([place]), Constipation by delayed colonic transit, Coronary artery disease, Depression, Dyspnea, Grover's disease, Headache, migraines, NSTEMI (non-ST elevated myocardial infarction) ([place]) (02/2016), Pneumonia, Restless leg syndrome, and Thyroid disease.  CAD-history of heart cath and coronary angioplasty in 2017.  Currently on Crestor 20mg , aspirin 81 mg, niacin daily and metoprolol 25 ER.  She is seen by cardiology, Dr. Einar Gip on a yearly basis. Lab Results  Component Value Date   CHOL 141 08/17/2020   HDL 72 08/17/2020   LDLCALC 60 08/17/2020   LDLDIRECT 115.5 11/09/2011   TRIG 33 08/17/2020   CHOLHDL 2 09/18/2018   Anxiety/Depression -followed by psychiatry. She takes Abilify 2 mg daily, Prozac 80 mg daily.  She does feel as though her symptoms are well controlled  Glucose Intolerance-currently prescribed Metformin 250 mg daily.  Her last A1c approximately 1 month ago that was done by cardiology was 6.2 Lab Results  Component Value Date   HGBA1C 6.2 (H) 08/17/2020   OAB - Takes Myrbetriq 25 mg daily but cannot afford this medication. She will go back to [facility] - which she has a prescription for  All immunizations and health maintenance protocols were reviewed with the patient and needed orders were placed.  Appropriate screening laboratory values were ordered for the patient including screening of hyperlipidemia, renal function and hepatic function.  Medication reconciliation,  past medical history, social history, problem list and allergies were reviewed in detail with the patient  Goals were established with regard to weight loss, exercise, and  diet in compliance with medications  She is up-to-date on  routine colon cancer screening as well as well woman care.   Review of Systems  Constitutional: Negative.   HENT: Negative.   Eyes: Negative.   Respiratory: Negative.   Cardiovascular: Negative.   Gastrointestinal: Negative.   Endocrine: Negative.   Genitourinary: Negative.   Musculoskeletal: Negative.   Skin: Negative.   Allergic/Immunologic: Negative.   Neurological: Negative.   Hematological: Negative.   Psychiatric/Behavioral: Negative.    Past Medical History:  Diagnosis Date   Allergy    Anemia    "many years ago"   Anxiety    Arthritis    Cancer ([place])    skin cancer (basal cell)   Constipation by delayed colonic transit    Coronary artery disease    Depression    see Meridith Child psychotherapist and Trey Paula   Dyspnea    occ   Grover's disease    Headache    Hx of migraines    NSTEMI (non-ST elevated myocardial infarction) ([place]) 02/2016   Pneumonia    walking pneumonia   Restless leg syndrome    Thyroid disease    hx of hypothyroid    Social History   Socioeconomic History   Marital status: Married    Spouse name: Not on file   Number of children: 2   Years of education: Not on file   Highest education level: Not on file  Occupational History   Not on file  Tobacco Use   Smoking status: Never Smoker   Smokeless tobacco: Never Used  Vaping Use   Vaping Use: Never used  Substance and Sexual Activity   Alcohol use: Yes  Alcohol/week: 7.0 standard drinks    Types: 7 Glasses of wine per week    Comment: occassionally   Drug use: No   Sexual activity: Not on file  Other Topics Concern   Not on file  Social History Narrative   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    Social Determinants of Health   Financial Resource Strain: Low Risk     Difficulty of Paying Living Expenses: Not hard at all  Food Insecurity: No Food Insecurity   Worried About Charity fundraiser in the Last Year: Never true   [place] in the Last Year: Never true  Transportation Needs: No Transportation Needs   Lack of Transportation (Medical): No   Lack of Transportation (Non-Medical): No  Physical Activity: Insufficiently Active   Days of Exercise per Week: 3 days   Minutes of Exercise per Session: 40 min  Stress: No Stress Concern Present   Feeling of Stress : Not at all  Social Connections: Moderately Integrated   Frequency of Communication with Friends and Family: More than three times a week   Frequency of Social Gatherings with Friends and Family: Once a week   Attends Religious Services: More than 4 times per year   Active Member of [employer] or Organizations: No   Attends Archivist Meetings: Never   Marital Status: Married  Human resources officer Violence: Not At Risk   Fear of Current or Ex-Partner: No   Emotionally Abused: No   Physically Abused: No   Sexually Abused: No    Past Surgical History:  Procedure Laterality Date   APPENDECTOMY     AUGMENTATION MAMMAPLASTY Bilateral    BACK SURGERY     L4-L5   BREAST SURGERY     augmentation   CARDIAC CATHETERIZATION N/A 03/08/2016   Procedure: Left Heart Cath and Coronary Angiography;  Surgeon: Adrian Prows, MD;  Location: [place] [facility];  Service: Cardiovascular;  Laterality: N/A;   CARDIAC CATHETERIZATION N/A 03/08/2016   Procedure: Coronary Stent Intervention;  Surgeon: Adrian Prows, MD;  Location: [place];  Service: Cardiovascular;  Laterality: N/A;  Resolute 3.5x15   CARDIAC CATHETERIZATION N/A 03/08/2016   Procedure: Intravascular Ultrasound/IVUS;  Surgeon: Adrian Prows, MD;  Location: [place] [facility];  Service: Cardiovascular;  Laterality: N/A;  RCA MID   CESAREAN SECTION     x2   COLONOSCOPY     CORONARY ANGIOGRAM  03/08/2016    CORONARY ANGIOPLASTY     DES LAD, DES D1 02/16/16 [facility] Darter, [place]), DES RCA 03/08/16 [facility])   LAMINECTOMY WITH POSTERIOR LATERAL ARTHRODESIS LEVEL 2 N/A 03/07/2017   Procedure: Lumbar Four-Five, Lumbar Five-Sacral One Posterior Lateral Fusion with Removal of pedicle screws;  Surgeon: Eustace Moore, MD;  Location: [place];  Service: Neurosurgery;  Laterality: N/A;    Family History  Problem Relation Age of Onset   Cancer Mother        pancreatic   Cancer Maternal Uncle        pancreatic   Cancer Maternal Grandmother        pancreatic   Breast cancer Neg Hx     No Known Allergies  Current Outpatient Medications on File Prior  to Visit  Medication Sig Dispense Refill   ARIPiprazole (ABILIFY) 2 MG tablet Take 1 mg by mouth daily.      aspirin 81 MG tablet Take 81 mg by mouth daily.      calcium-vitamin D (OSCAL WITH D) 250-125 MG-UNIT tablet Take 1 tablet by mouth daily.     celecoxib (CELEBREX) 200 MG capsule Take 1 capsule (200 mg total) by mouth daily. 90 capsule 0   FLUOXETINE HCL PO Take 80 mg by mouth every evening.   2   Melatonin 10 MG TABS Take 10 mg by mouth at bedtime.     metFORMIN (GLUCOPHAGE) 500 MG tablet TAKE 1/2 TABLET BY MOUTH DAILY WITH BREAKFAST. 45 tablet 0   metoprolol succinate (TOPROL-XL) 25 MG 24 hr tablet Take 1 tablet (25 mg total) by mouth daily. Take with or immediately following a meal. 30 tablet 2   mirabegron ER (MYRBETRIQ) 25 MG TB24 tablet Take 1 tablet (25 mg total) by mouth daily. 90 tablet 1   niacin (NIASPAN) 1000 MG CR tablet Take 1 tablet (1,000 mg total) by mouth at bedtime. 180 tablet 1   Probiotic Product (PROBIOTIC DAILY PO) Take 1 capsule by mouth daily.     rosuvastatin (CRESTOR) 20 MG tablet TAKE 1 TABLET BY MOUTH EVERY DAY 90 tablet 3   No current facility-administered medications on file prior to visit.    BP 124/76 (BP Location: Left Arm, Patient Position: Sitting, Cuff Size: Normal)    Pulse 60    Temp 98.4 F (36.9 C)  (Oral)    Ht 5\' 5"  (1.651 m)    Wt 150 lb 9.6 oz (68.3 kg)    BMI 25.06 kg/m       Objective:   Physical Exam Vitals and nursing note reviewed.  Constitutional:      General: She is not in acute distress.    Appearance: Normal appearance. She is well-developed. She is not ill-appearing.  HENT:     Head: Normocephalic and atraumatic.     Right Ear: Tympanic membrane, ear canal and external ear normal. There is no impacted cerumen.     Left Ear: Tympanic membrane, ear canal and external ear normal. There is no impacted cerumen.     Nose: Nose normal. No congestion or rhinorrhea.     Mouth/Throat:     Mouth: Mucous membranes are moist.     Pharynx: Oropharynx is clear. No oropharyngeal exudate or posterior oropharyngeal erythema.  Eyes:     General:        Right eye: No discharge.        Left eye: No discharge.     Extraocular Movements: Extraocular movements intact.     Conjunctiva/sclera: Conjunctivae normal.     Pupils: Pupils are equal, round, and reactive to light.  Neck:     Thyroid: No thyromegaly.     Vascular: No carotid bruit.     Trachea: No tracheal deviation.  Cardiovascular:     Rate and Rhythm: Normal rate and regular rhythm.     Pulses: Normal pulses.     Heart sounds: Normal heart sounds. No murmur heard.  No friction rub. No gallop.   Pulmonary:     Effort: Pulmonary effort is normal. No respiratory distress.     Breath sounds: Normal breath sounds. No stridor. No wheezing, rhonchi or rales.  Chest:     Chest wall: No tenderness.  Abdominal:     General: Abdomen is flat. Bowel sounds are normal. There  is no distension.     Palpations: Abdomen is soft. There is no mass.     Tenderness: There is no abdominal tenderness. There is no right CVA tenderness, left CVA tenderness, guarding or rebound.     Hernia: No hernia is present.  Musculoskeletal:        General: No swelling, tenderness, deformity or signs of injury. Normal range of motion.     Cervical back:  Normal range of motion and neck supple.     Right lower leg: No edema.     Left lower leg: No edema.  Lymphadenopathy:     Cervical: No cervical adenopathy.  Skin:    General: Skin is warm and dry.     Coloration: Skin is not jaundiced or pale.     Findings: No bruising, erythema, lesion or rash.  Neurological:     General: No focal deficit present.     Mental Status: She is alert and oriented to person, place, and time.     Cranial Nerves: No cranial nerve deficit.     Sensory: No sensory deficit.     Motor: No weakness.     Coordination: Coordination normal.     Gait: Gait normal.     Deep Tendon Reflexes: Reflexes normal.  Psychiatric:        Mood and Affect: Mood normal.        Behavior: Behavior normal.        Thought Content: Thought content normal.        Judgment: Judgment normal.       Assessment & Plan:   1. Routine general medical examination at a health care facility -Patient had lab work done about 1 month ago by cardiology.  I do not see a reason to repeat these labs.  Advised to continue to eat a heart healthy diet and exercise as much as possible  2. Coronary artery disease involving native coronary artery of native heart without angina pectoris -Continue with plan of care by cardiology  3. OAB (overactive bladder) -Okay with having her go back to taking Detrol.  We will see if her insurance covers Myrbetriq after the first of the year.  She does not need a prescription at this time  4. Anxiety -Continue with plan of care by psychiatry  5. Depression, unspecified depression type -Continue with plan of care by psychiatry  6. Impaired glucose tolerance -Continue with Metformin as directed   [name], NP

## 2020-10-18 DIAGNOSIS — C44719 Basal cell carcinoma of skin of left lower limb, including hip: Secondary | ICD-10-CM | POA: Diagnosis not present

## 2020-10-18 DIAGNOSIS — L853 Xerosis cutis: Secondary | ICD-10-CM | POA: Diagnosis not present

## 2020-10-21 ENCOUNTER — Other Ambulatory Visit: Payer: Self-pay | Admitting: Cardiology

## 2020-10-21 DIAGNOSIS — I2510 Atherosclerotic heart disease of native coronary artery without angina pectoris: Secondary | ICD-10-CM

## 2020-12-12 ENCOUNTER — Other Ambulatory Visit: Payer: Self-pay | Admitting: Cardiology

## 2020-12-12 ENCOUNTER — Other Ambulatory Visit: Payer: Self-pay | Admitting: Adult Health

## 2020-12-12 DIAGNOSIS — E7841 Elevated Lipoprotein(a): Secondary | ICD-10-CM

## 2020-12-12 DIAGNOSIS — I2510 Atherosclerotic heart disease of native coronary artery without angina pectoris: Secondary | ICD-10-CM

## 2020-12-13 NOTE — Telephone Encounter (Signed)
Every 6 months for A1C follow up?

## 2020-12-14 NOTE — Telephone Encounter (Signed)
Sent to the pharmacy by e-scribe. 

## 2020-12-14 NOTE — Telephone Encounter (Signed)
Every six months  

## 2020-12-29 ENCOUNTER — Encounter: Payer: Self-pay | Admitting: Adult Health

## 2020-12-29 MED ORDER — CELECOXIB 200 MG PO CAPS: 200.0000 mg | ORAL_CAPSULE | Freq: Every day | ORAL | 0 refills | Status: DC

## 2021-01-03 ENCOUNTER — Other Ambulatory Visit: Payer: Self-pay

## 2021-01-04 ENCOUNTER — Encounter: Payer: Self-pay | Admitting: Adult Health

## 2021-01-05 ENCOUNTER — Other Ambulatory Visit: Payer: Self-pay | Admitting: Adult Health

## 2021-01-05 MED ORDER — FLUOXETINE HCL 40 MG PO CAPS: 80.0000 mg | ORAL_CAPSULE | Freq: Every evening | ORAL | 1 refills | Status: DC

## 2021-01-05 MED ORDER — ARIPIPRAZOLE 2 MG PO TABS: 2.0000 mg | ORAL_TABLET | Freq: Every day | ORAL | 1 refills | Status: DC

## 2021-02-02 DIAGNOSIS — L820 Inflamed seborrheic keratosis: Secondary | ICD-10-CM | POA: Diagnosis not present

## 2021-02-02 DIAGNOSIS — L570 Actinic keratosis: Secondary | ICD-10-CM | POA: Diagnosis not present

## 2021-02-08 ENCOUNTER — Other Ambulatory Visit: Payer: Self-pay | Admitting: Adult Health

## 2021-02-08 DIAGNOSIS — Z1231 Encounter for screening mammogram for malignant neoplasm of breast: Secondary | ICD-10-CM

## 2021-02-20 ENCOUNTER — Ambulatory Visit: Payer: Medicare PPO | Admitting: Cardiology

## 2021-03-03 ENCOUNTER — Ambulatory Visit: Payer: Medicare PPO | Admitting: Cardiology

## 2021-03-09 ENCOUNTER — Other Ambulatory Visit: Payer: Self-pay | Admitting: Adult Health

## 2021-03-20 ENCOUNTER — Encounter: Payer: Self-pay | Admitting: Adult Health

## 2021-03-20 ENCOUNTER — Ambulatory Visit (INDEPENDENT_AMBULATORY_CARE_PROVIDER_SITE_OTHER): Payer: Medicare PPO

## 2021-03-20 ENCOUNTER — Encounter: Payer: Self-pay | Admitting: Family Medicine

## 2021-03-20 ENCOUNTER — Ambulatory Visit: Payer: Medicare PPO | Admitting: Family Medicine

## 2021-03-20 ENCOUNTER — Other Ambulatory Visit: Payer: Self-pay

## 2021-03-20 VITALS — BP 128/78 | HR 67 | Temp 98.5°F | Wt 162.4 lb

## 2021-03-20 DIAGNOSIS — M79672 Pain in left foot: Secondary | ICD-10-CM

## 2021-03-20 DIAGNOSIS — W108XXA Fall (on) (from) other stairs and steps, initial encounter: Secondary | ICD-10-CM

## 2021-03-20 NOTE — Progress Notes (Signed)
Subjective:    Patient ID: Monique Miller, female    DOB: 14-Oct-1951, 70 y.o.   MRN: [id number]  Chief Complaint  Patient presents with  . Foot Injury    Fell yesterday down some steps, landed on left foot. Put ice on it last night, but it has gotten worse overnight, still swollen and sore to the touch.    HPI Patient was seen today for acute concern.  Pt fell down stairs yesterday.  Unsure of exactly how she landed but endorses soreness of RUE and pain on top of L foot.  Pt got up with assistance from her son in law.  Pt was able to apply pressure to her foot immediately after the incident.  Endorses increased pain 8/10 and edema last night.  Tried ice.  Has not been able to wear shoe 2/2 discomfort.  Past Medical History:  Diagnosis Date  . Allergy   . Anemia    "many years ago"  . Anxiety   . Arthritis   . Cancer ([place])    skin cancer (basal cell)  . Constipation by delayed colonic transit   . Coronary artery disease   . Depression    see Meridith Child psychotherapist and Trey Paula  . Dyspnea    occ  . Grover's disease   . Headache   . Hx of migraines   . NSTEMI (non-ST elevated myocardial infarction) ([place]) 02/2016  . Pneumonia    walking pneumonia  . Restless leg syndrome   . Thyroid disease    hx of hypothyroid    No Known Allergies  ROS General: Denies fever, chills, night sweats, changes in weight, changes in appetite HEENT: Denies headaches, ear pain, changes in vision, rhinorrhea, sore throat CV: Denies CP, palpitations, SOB, orthopnea Pulm: Denies SOB, cough, wheezing GI: Denies abdominal pain, nausea, vomiting, diarrhea, constipation GU: Denies dysuria, hematuria, frequency, vaginal discharge Msk: Denies muscle cramps, joint pains  + L foot pain and edema Neuro: Denies weakness, numbness, tingling Skin: Denies rashes, bruising Psych: Denies depression, anxiety, hallucinations     Objective:    Blood pressure 128/78, pulse 67, temperature 98.5 F (36.9 C), temperature  source Oral, weight 162 lb 6.4 oz (73.7 kg), SpO2 99 %.  Gen. Pleasant, well-nourished, in no distress, normal affect    Wearing right shoe and sock on left foot HEENT: [place]/AT, face symmetric, conjunctiva clear, no scleral icterus, PERRLA, EOMI, nares patent without drainage Lungs: no accessory muscle use Cardiovascular: RRR, no peripheral edema Musculoskeletal: L DP and PT pulses intact.  No TTP of ankle or medial malleolus.  Normal inversion and eversion of left foot.  TTP of medial dorsum of foot at first metatarsal/midfoot.  No ankle instability no deformities, no cyanosis or clubbing, normal tone Neuro:  A&Ox3, CN II-XII intact, ambulating with a limp favoring left foot. Skin:  Warm, no lesions/ rash.  Mild erythema of medial left midfoot foot at first metatarsal  Wt Readings from Last 3 Encounters:  03/20/21 162 lb 6.4 oz (73.7 kg)  09/27/20 150 lb 9.6 oz (68.3 kg)  08/24/20 149 lb (67.6 kg)    Lab Results  Component Value Date   WBC 7.0 08/17/2020   HGB 11.1 08/17/2020   HCT 35.7 08/17/2020   PLT 186 08/17/2020   GLUCOSE 95 08/17/2020   CHOL 141 08/17/2020   TRIG 33 08/17/2020   HDL 72 08/17/2020   LDLDIRECT 115.5 11/09/2011   LDLCALC 60 08/17/2020   ALT 48 (H) 08/17/2020   AST 52 (H)  08/17/2020   NA 140 08/17/2020   K 4.8 08/17/2020   CL 103 08/17/2020   CREATININE 0.95 08/17/2020   BUN 22 08/17/2020   CO2 24 08/17/2020   TSH 2.300 08/03/2019   INR 1.05 03/07/2017   HGBA1C 6.2 (H) 08/17/2020    Assessment/Plan:  Left foot pain -Discussed possible causes including sprain, deep bruise -We will obtain x-ray to rule out fracture -Elevation, compression, ice, heat, NSAIDs or Tylenol advised.  Pt has Tylenol and Ibuprofen at home. -Appropriate sized postop shoe not available in clinic. -Left foot wrapped in clinic with ACE bandage -Further recommendations based on imaging results. - Plan: DG Foot Complete Left  Fall (on) (from) other stairs and steps, initial  encounter -Fall prevention  F/u as needed  Grier Mitts, MD

## 2021-03-20 NOTE — Patient Instructions (Addendum)
Is advised that you elevate your foot when sitting.  You can also use an Ace bandage to wrap your foot to provider compression.  You can take Tylenol or ibuprofen as needed for any pain/discomfort.  He can also continue icing your foot to help decrease swelling. Foot Pain Many things can cause foot pain. Some common causes are:  An injury.  A sprain.  Arthritis.  Blisters.  Bunions. Follow these instructions at home: Managing pain, stiffness, and swelling If directed, put ice on the painful area:  Put ice in a plastic bag.  Place a towel between your skin and the bag.  Leave the ice on for 20 minutes, 2-3 times a day.   Activity  Do not stand or walk for long periods.  Return to your normal activities as told by your health care provider. Ask your health care provider what activities are safe for you.  Do stretches to relieve foot pain and stiffness as told by your health care provider.  Do not lift anything that is heavier than 10 lb (4.5 kg), or the limit that you are told, until your health care provider says that it is safe. Lifting a lot of weight can put added pressure on your feet. Lifestyle  Wear comfortable, supportive shoes that fit you well. Do not wear high heels.  Keep your feet clean and dry. General instructions  Take over-the-counter and prescription medicines only as told by your health care provider.  Rub your foot gently.  Pay attention to any changes in your symptoms.  Keep all follow-up visits as told by your health care provider. This is important. Contact a health care provider if:  Your pain does not get better after a few days of self-care.  Your pain gets worse.  You cannot stand on your foot. Get help right away if:  Your foot is numb or tingling.  Your foot or toes are swollen.  Your foot or toes turn white or blue.  You have warmth and redness along your foot. Summary  Common causes of foot pain are injury, sprain, arthritis,  blisters or bunions.  Ice, medicines, and comfortable shoes may help foot pain.  Contact your health care provider if your pain does not get better after a few days of self-care. This information is not intended to replace advice given to you by your health care provider. Make sure you discuss any questions you have with your health care provider. Document Revised: 08/14/2018 Document Reviewed: 08/14/2018 Elsevier Patient Education  2021 [employer].

## 2021-03-21 ENCOUNTER — Encounter: Payer: Self-pay | Admitting: Cardiology

## 2021-03-21 ENCOUNTER — Ambulatory Visit: Payer: Medicare PPO | Admitting: Cardiology

## 2021-03-21 VITALS — BP 114/68 | HR 68 | Temp 98.0°F | Resp 17 | Ht 65.0 in | Wt 163.8 lb

## 2021-03-21 DIAGNOSIS — E7841 Elevated Lipoprotein(a): Secondary | ICD-10-CM | POA: Diagnosis not present

## 2021-03-21 DIAGNOSIS — E782 Mixed hyperlipidemia: Secondary | ICD-10-CM | POA: Diagnosis not present

## 2021-03-21 DIAGNOSIS — I25118 Atherosclerotic heart disease of native coronary artery with other forms of angina pectoris: Secondary | ICD-10-CM | POA: Diagnosis not present

## 2021-03-21 DIAGNOSIS — I2510 Atherosclerotic heart disease of native coronary artery without angina pectoris: Secondary | ICD-10-CM | POA: Diagnosis not present

## 2021-03-21 NOTE — Progress Notes (Signed)
Patient viewed results on MyChart. 

## 2021-03-21 NOTE — Progress Notes (Signed)
Primary Physician/Referring:  [name], NP  Monique Miller ID: Monique Miller, female    DOB: 12-04-1950, 70 y.o.   MRN: [id number]  Chief Complaint  Monique Miller presents with  . Coronary Artery Disease    6 MONTHS   HPI:    Monique Miller  is a 70 y.o. Caucasian female Monique Miller with NSTEMI SP PCI to LAD and D1 with the 2.75 x 23 mm Xience DES and 2.5 x 15 mm Xience DES respectively in [place], [place] for occluded diagonal and mid LAD in 2016, IVUS guided PCI to the RCA with a 3.5x15 mm Resolute DES on 03/08/2016.  Past medical history significant for mixed hyperlipidemia with markedly elevated LPA, LDL within normal limits presently tolerating high-dose, high intensity statin along with niacin.  Monique Miller has family history of premature coronary disease with brother having had MI at age 34. Monique Miller also has multiple second-degree relatives with CAD as well.    On Monique Miller last office visit Monique Miller had complained of dizziness which is now completely resolved.  Monique Miller still continues to have exertional chest tightness only with extremes of exertion.  Monique Miller is accompanied by Monique Miller husband.  Chest discomfort is associated with mild dyspnea.  Symptoms are remained stable.  Also since being on beta-blockers, Monique Miller has not had any further palpitations.  Past Medical History:  Diagnosis Date  . Allergy   . Anemia    "many years ago"  . Anxiety   . Arthritis   . Cancer ([place])    skin cancer (basal cell)  . Constipation by delayed colonic transit   . Coronary artery disease   . Depression    see Meridith Child psychotherapist and Trey Paula  . Dyspnea    occ  . Grover's disease   . Headache   . Hx of migraines   . NSTEMI (non-ST elevated myocardial infarction) ([place]) 02/2016  . Pneumonia    walking pneumonia  . Restless leg syndrome   . Thyroid disease    hx of hypothyroid   Past Surgical History:  Procedure Laterality Date  . APPENDECTOMY    . AUGMENTATION MAMMAPLASTY Bilateral   . BACK SURGERY     L4-L5  . BREAST SURGERY      augmentation  . CARDIAC CATHETERIZATION N/A 03/08/2016   Procedure: Left Heart Cath and Coronary Angiography;  Surgeon: Adrian Prows, MD;  Location: [facility];  Service: Cardiovascular;  Laterality: N/A;  . CARDIAC CATHETERIZATION N/A 03/08/2016   Procedure: Coronary Stent Intervention;  Surgeon: Adrian Prows, MD;  Location: [facility];  Service: Cardiovascular;  Laterality: N/A;  Resolute 3.5x15  . CARDIAC CATHETERIZATION N/A 03/08/2016   Procedure: Intravascular Ultrasound/IVUS;  Surgeon: Adrian Prows, MD;  Location: [facility];  Service: Cardiovascular;  Laterality: N/A;  RCA MID  . CESAREAN SECTION     x2  . COLONOSCOPY    . CORONARY ANGIOGRAM  03/08/2016  . CORONARY ANGIOPLASTY     DES LAD, DES D1 02/16/16 [facility] Darter, [place]), DES RCA 03/08/16 [facility])  . LAMINECTOMY WITH POSTERIOR LATERAL ARTHRODESIS LEVEL 2 N/A 03/07/2017   Procedure: Lumbar Four-Five, Lumbar Five-Sacral One Posterior Lateral Fusion with Removal of pedicle screws;  Surgeon: Eustace Moore, MD;  Location: [facility];  Service: Neurosurgery;  Laterality: N/A;   Social History   Tobacco Use  . Smoking status: Never Smoker  . Smokeless tobacco: Never Used  Substance Use Topics  . Alcohol use: Yes    Alcohol/week: 7.0 standard drinks    Types:  7 Glasses of wine per week    Comment: occassionally  Marital Status: Married    ROS  Review of Systems  Cardiovascular: Positive for chest pain and dyspnea on exertion. Negative for leg swelling.  Gastrointestinal: Negative for melena.   Objective   Vitals with BMI 03/21/2021 03/20/2021 09/27/2020  Height 5\' 5"  - 5\' 5"   Weight 163 lbs 13 oz 162 lbs 6 oz 150 lbs 10 oz  BMI 52.84 - 13.24  Systolic 401 027 253  Diastolic 68 78 76  Pulse 68 67 60    Blood pressure 114/68, pulse 68, temperature 98 F (36.7 C), temperature source Temporal, resp. rate 17, height 5\' 5"  (1.651 m), weight 163 lb 12.8 oz (74.3 kg), SpO2 98 %. Body mass index is 27.26 kg/m.   Physical  Exam Constitutional:      Appearance: Monique Miller is normal weight.     Comments: Monique Miller is moderately built and well-nourished in no acute distress.  Neck:     Thyroid: No thyromegaly.     Vascular: No JVD.  Cardiovascular:     Rate and Rhythm: Normal rate and regular rhythm.     Pulses: Intact distal pulses.     Heart sounds: Normal heart sounds. No murmur heard. No gallop.      Comments: No leg edema, no JVD. Pulmonary:     Effort: Pulmonary effort is normal.     Breath sounds: Normal breath sounds.  Abdominal:     General: Bowel sounds are normal.     Palpations: Abdomen is soft.  Musculoskeletal:        General: Normal range of motion.     Cervical back: Neck supple.  Skin:    General: Skin is warm and dry.  Neurological:     General: No focal deficit present.     Mental Status: Monique Miller is oriented to person, place, and time.    Radiology: DG Foot Complete Left  Result Date: 03/20/2021 CLINICAL DATA:  LEFT foot pain status post fall downstairs EXAM: LEFT FOOT - COMPLETE 3+ VIEW COMPARISON:  None. FINDINGS: No fracture or dislocation of mid foot or forefoot. The phalanges are normal. The calcaneus is normal. No soft tissue abnormality. IMPRESSION: No fracture or dislocation. Electronically Signed   By: Suzy Bouchard M.D.   On: 03/20/2021 10:51    Laboratory examination:     Ref Range & Units 08/03/2019  Lipoprotein (a) <75.0 nmol/L 120.7High     Recent Labs    08/17/20 0805  NA 140  K 4.8  CL 103  CO2 24  GLUCOSE 95  BUN 22  CREATININE 0.95  CALCIUM 9.3  GFRNONAA 63  GFRAA 72   CMP Latest Ref Rng & Units 08/17/2020 08/03/2019 09/18/2018  Glucose 65 - 99 mg/dL 95 103(H) 111(H)  BUN 8 - 27 mg/dL 22 15 17   Creatinine 0.57 - 1.00 mg/dL 0.95 1.05(H) 0.87  Sodium 134 - 144 mmol/L 140 141 137  Potassium 3.5 - 5.2 mmol/L 4.8 5.0 3.9  Chloride 96 - 106 mmol/L 103 104 99  CO2 20 - 29 mmol/L 24 22 30   Calcium 8.7 - 10.3 mg/dL 9.3 9.2 9.3  Total Protein 6.0 - 8.5 g/dL 6.3 6.2  6.7  Total Bilirubin 0.0 - 1.2 mg/dL 0.4 0.5 0.7  Alkaline Phos 44 - 121 IU/L 65 54 48  AST 0 - 40 IU/L 52(H) 36 24  ALT 0 - 32 IU/L 48(H) 29 18   CBC Latest Ref Rng & Units 08/17/2020 09/18/2018  04/01/2017  WBC 3.4 - 10.8 x10E3/uL 7.0 4.5 5.4  Hemoglobin 11.1 - 15.9 g/dL 11.1 14.3 13.3  Hematocrit 34.0 - 46.6 % 35.7 42.9 40.4  Platelets 150 - 450 x10E3/uL 186 168.0 189   Lipid Panel Recent Labs    08/17/20 0805  CHOL 141  TRIG 33  LDLCALC 60  HDL 72    HEMOGLOBIN A1C Lab Results  Component Value Date   HGBA1C 6.2 (H) 08/17/2020   TSH 08/03/2019: Normal. 2.300  No results for input(s): TSH in the last 8760 hours. Medications and allergies   No Known Allergies   Current Outpatient Medications on File Prior to Visit  Medication Sig Dispense Refill  . ARIPiprazole (ABILIFY) 2 MG tablet Take 2 mg by mouth daily.    Marland Kitchen aspirin 81 MG tablet Take 81 mg by mouth daily.     . calcium-vitamin D (OSCAL WITH D) 250-125 MG-UNIT tablet Take 1 tablet by mouth daily.    . celecoxib (CELEBREX) 200 MG capsule Take 1 capsule (200 mg total) by mouth daily. (Monique Miller taking differently: Take 200 mg by mouth as needed.) 90 capsule 0  . FLUoxetine (PROZAC) 40 MG capsule Take 2 capsules (80 mg total) by mouth every evening. 180 capsule 1  . Melatonin 10 MG TABS Take 10 mg by mouth at bedtime.    . metFORMIN (GLUCOPHAGE) 500 MG tablet TAKE 1/2 TABLET BY MOUTH DAILY WITH BREAKFAST. 45 tablet 0  . metoprolol succinate (TOPROL-XL) 25 MG 24 hr tablet TAKE 1 TABLET BY MOUTH DAILY. TAKE WITH OR IMMEDIATELY FOLLOWING A MEAL. 90 tablet 3  . niacin (NIASPAN) 1000 MG CR tablet TAKE 1 TABLET BY MOUTH TWICE A DAY 180 tablet 1  . Probiotic Product (PROBIOTIC DAILY PO) Take 1 capsule by mouth daily.    . rosuvastatin (CRESTOR) 20 MG tablet TAKE 1 TABLET BY MOUTH EVERY DAY 90 tablet 3   No current facility-administered medications on file prior to visit.    Cardiac Studies:   Coronary angiogram 03/08/2016: PCI  to Prox RCA with 3.5x1 mm Resolute. Acute MI 02/16/2016 S/P PCI to LAD and D1 with the 2.75 x 23 mm Xience DES and 2.5 x 15 mm Xience DES respectively. Normal LVEF.  Lexiscan Tetrofosmin stress test 08/31/2020: Lexiscan nuclear stress test performed using 1-day protocol. Stress EKG is non-diagnostic, as this is pharmacological stress test. Rest and stress EJG shows sinus rhythm, old anteroseptal infarct.  SPECT images show small sized, mild intensity, fixed perfusion defect in apical anterior myocardium. Stress LVEF 74%. Low risk study.   Echocardiogram 08/31/2020: Normal LV systolic function with EF 60%. Left ventricle cavity is normal in size. Mild concentric hypertrophy of the left ventricle. Normal global wall motion. Calculated EF 60%. Left atrial cavity is mildly dilated. Mild (Grade I) mitral regurgitation. Mild tricuspid regurgitation. Estimated pulmonary artery systolic pressure 26 mmHg.  EKG    EKG 03/21/2021: Sinus bradycardia rate of 57 bpm.  Normal axis.  No evidence of ischemia or underlying injury pattern. No significant change from EKG 08/24/2020   Assessment:     ICD-10-CM   1. Coronary artery disease of native artery of native heart with stable angina pectoris ([place])  I25.118   2. Palpitations  R00.2 EKG 12-Lead  3. Mixed hyperlipidemia  E78.2   4. Elevated lipoprotein(a)  E78.41     Recommendations:   Kateleen Encarnacion  is a 70 y.o. Caucasian female Monique Miller with NSTEMI SP PCI to LAD and D1 with the 2.75 x 23 mm Xience  DES and 2.5 x 15 mm Xience DES respectively in [place], [place] for occluded diagonal and mid LAD in 2016, IVUS guided PCI to the RCA with a 3.5x15 mm Resolute DES on 03/08/2016.  Past medical history significant for mixed hyperlipidemia with markedly elevated LPA, LDL within normal limits presently tolerating high-dose, high intensity statin along with niacin.  Monique Miller has family history of premature coronary disease with brother having had MI at age 52.  On  Monique Miller last office visit 6 months ago I had switched Monique Miller from Altace to metoprolol in view of palpitations, Monique Miller does not have hypertension.  Monique Miller also had nuclear stress test and echocardiogram which were essentially unremarkable.  I reviewed external labs, lipids under excellent control.  CBC shows mild microcytic indicis but normal hemoglobin.  Renal function is normal.  Monique Miller remains stable without recurrence of angina pectoris.  I will see Monique Miller back on annual basis.    Adrian Prows, MD, [facility] 03/21/2021, 9:10 PM Office: [phone]

## 2021-03-27 ENCOUNTER — Other Ambulatory Visit: Payer: Self-pay | Admitting: Adult Health

## 2021-03-30 ENCOUNTER — Ambulatory Visit: Payer: Medicare PPO

## 2021-05-01 ENCOUNTER — Other Ambulatory Visit: Payer: Self-pay | Admitting: Cardiology

## 2021-05-01 DIAGNOSIS — I2510 Atherosclerotic heart disease of native coronary artery without angina pectoris: Secondary | ICD-10-CM

## 2021-05-05 ENCOUNTER — Other Ambulatory Visit: Payer: Self-pay

## 2021-05-05 ENCOUNTER — Other Ambulatory Visit: Payer: Self-pay | Admitting: Adult Health

## 2021-05-05 ENCOUNTER — Ambulatory Visit
Admission: RE | Admit: 2021-05-05 | Discharge: 2021-05-05 | Disposition: A | Discharge status: Home / Self Care | Payer: Medicare PPO | Source: Ambulatory Visit | Attending: Adult Health | Admitting: Adult Health

## 2021-05-05 DIAGNOSIS — Z1231 Encounter for screening mammogram for malignant neoplasm of breast: Secondary | ICD-10-CM

## 2021-05-09 ENCOUNTER — Encounter: Payer: Self-pay | Admitting: Family Medicine

## 2021-05-10 ENCOUNTER — Telehealth (INDEPENDENT_AMBULATORY_CARE_PROVIDER_SITE_OTHER): Payer: Medicare PPO | Admitting: Family Medicine

## 2021-05-10 ENCOUNTER — Other Ambulatory Visit: Payer: Self-pay

## 2021-05-10 ENCOUNTER — Encounter: Payer: Self-pay | Admitting: Family Medicine

## 2021-05-10 VITALS — Temp 99.0°F | Wt 150.0 lb

## 2021-05-10 DIAGNOSIS — U071 COVID-19: Secondary | ICD-10-CM

## 2021-05-10 MED ORDER — MOLNUPIRAVIR EUA 200MG CAPSULE: 4.0000 | ORAL_CAPSULE | Freq: Two times a day (BID) | ORAL | 0 refills | Status: AC

## 2021-05-10 NOTE — Progress Notes (Signed)
Virtual Visit via Video Note  I connected with Monique Miller on 05/10/21 at  9:00 AM EDT by a video enabled telemedicine application 2/2 [id number] pandemic and verified that I am speaking with the correct person using two identifiers.  Location patient: home Location provider:work or home office Persons participating in the virtual visit: patient, provider  I discussed the limitations of evaluation and management by telemedicine and the availability of in person appointments. The patient expressed understanding and agreed to proceed.   HPI: Pt with fever Tmax 101 F yesterday, body aches, sore throat, and congestion.  Started feeling bad on Sunday night, then became worse.  Pt tested positive for COVID with at home Rapid test on 05/09/21.  Tried Tylenol and dayquil.  No appetite.  Denies n/v, diarrhea, loss of taste, ear pain or pressure, facial pain or pressure.  Unsure about loss of smell 2/2 congestion.  Pt's husband had COVID 2 wks ago.  Patient had Pfizer COVID-19 vaccine x2 and 1 booster.   ROS: See pertinent positives and negatives per HPI.  Past Medical History:  Diagnosis Date   Allergy    Anemia    "many years ago"   Anxiety    Arthritis    Cancer ([place])    skin cancer (basal cell)   Constipation by delayed colonic transit    Coronary artery disease    Depression    see Meridith Child psychotherapist and Trey Paula   Dyspnea    occ   Grover's disease    Headache    Hx of migraines    NSTEMI (non-ST elevated myocardial infarction) ([place]) 02/2016   Pneumonia    walking pneumonia   Restless leg syndrome    Thyroid disease    hx of hypothyroid    Past Surgical History:  Procedure Laterality Date   APPENDECTOMY     AUGMENTATION MAMMAPLASTY Bilateral    Patient doesn't remember date   BACK SURGERY     L4-L5   BREAST SURGERY     augmentation   CARDIAC CATHETERIZATION N/A 03/08/2016   Procedure: Left Heart Cath and Coronary Angiography;  Surgeon: Adrian Prows, MD;  Location: [facility];  Service: Cardiovascular;  Laterality: N/A;   CARDIAC CATHETERIZATION N/A 03/08/2016   Procedure: Coronary Stent Intervention;  Surgeon: Adrian Prows, MD;  Location: [place] [facility];  Service: Cardiovascular;  Laterality: N/A;  Resolute 3.5x15   CARDIAC CATHETERIZATION N/A 03/08/2016   Procedure: Intravascular Ultrasound/IVUS;  Surgeon: Adrian Prows, MD;  Location: [facility] [facility];  Service: Cardiovascular;  Laterality: N/A;  RCA MID   CESAREAN SECTION     x2   COLONOSCOPY     CORONARY ANGIOGRAM  03/08/2016   CORONARY ANGIOPLASTY     DES LAD, DES D1 02/16/16 Bartolo Darter, [place]), DES RCA 03/08/16 [facility])   LAMINECTOMY WITH POSTERIOR LATERAL ARTHRODESIS LEVEL 2 N/A 03/07/2017   Procedure: Lumbar Four-Five, Lumbar Five-Sacral One Posterior Lateral Fusion with Removal of pedicle screws;  Surgeon: Eustace Moore, MD;  Location: [place] [facility];  Service: Neurosurgery;  Laterality: N/A;    Family History  Problem Relation Age of Onset   Cancer Mother        pancreatic   Cancer Maternal Uncle        pancreatic   Cancer Maternal Grandmother        pancreatic   Heart disease Brother    Breast cancer Neg Hx       Current Outpatient Medications:    aspirin 81 MG  tablet, Take 81 mg by mouth daily. , Disp: , Rfl:    calcium-vitamin D (OSCAL WITH D) 250-125 MG-UNIT tablet, Take 1 tablet by mouth daily., Disp: , Rfl:    celecoxib (CELEBREX) 200 MG capsule, TAKE ONE CAPSULE BY MOUTH DAILY, Disp: 90 capsule, Rfl: 0   Melatonin 10 MG TABS, Take 10 mg by mouth at bedtime., Disp: , Rfl:    metFORMIN (GLUCOPHAGE) 500 MG tablet, TAKE 1/2 TABLET BY MOUTH DAILY WITH BREAKFAST., Disp: 45 tablet, Rfl: 0   metoprolol succinate (TOPROL-XL) 25 MG 24 hr tablet, TAKE 1 TABLET BY MOUTH DAILY. TAKE WITH OR IMMEDIATELY FOLLOWING A MEAL., Disp: 90 tablet, Rfl: 3   niacin (NIASPAN) 1000 MG CR tablet, TAKE 1 TABLET BY MOUTH TWICE A DAY, Disp: 180 tablet, Rfl: 1   Probiotic Product (PROBIOTIC DAILY PO), Take 1 capsule  by mouth daily., Disp: , Rfl:    rosuvastatin (CRESTOR) 20 MG tablet, TAKE 1 TABLET BY MOUTH EVERY DAY, Disp: 90 tablet, Rfl: 3   FLUoxetine (PROZAC) 40 MG capsule, Take 2 capsules (80 mg total) by mouth every evening., Disp: 180 capsule, Rfl: 1  EXAM:  VITALS per patient if applicable: RR between 88-82 bpm  GENERAL: alert, oriented, appears well and in no acute distress  HEENT: atraumatic, conjunctiva clear, no obvious abnormalities on inspection of external nose and ears  NECK: normal movements of the head and neck  LUNGS: Intermittent cough. On inspection no signs of respiratory distress, breathing rate appears normal, no obvious gross SOB, gasping or wheezing  CV: no obvious cyanosis  MS: moves all visible extremities without noticeable abnormality  PSYCH/NEURO: pleasant and cooperative, no obvious depression or anxiety, speech and thought processing grossly intact  ASSESSMENT AND PLAN:  Discussed the following assessment and plan:  COVID-19 virus infection  -Positive COVID-19 at home rapid test on 05/09/2021 -Continue supportive care of symptoms including Tylenol, rest, hydration, gargling with warm salt water or Chloraseptic spray, OTC cough/cold medications -Discussed r/b/a of antiviral meds.  Pt wishes to start molnupiravir. -Self quarantine -Given strict precautions - Plan: molnupiravir EUA 200 mg CAPS  Follow-up as needed   I discussed the assessment and treatment plan with the patient. The patient was provided an opportunity to ask questions and all were answered. The patient agreed with the plan and demonstrated an understanding of the instructions.   The patient was advised to call back or seek an in-person evaluation if the symptoms worsen or if the condition fails to improve as anticipated.  Billie Ruddy, MD

## 2021-05-10 NOTE — Telephone Encounter (Signed)
Spoke with patient and she is arrived for an appointment with Dr Volanda Napoleon.

## 2021-06-08 ENCOUNTER — Other Ambulatory Visit: Payer: Self-pay | Admitting: Cardiology

## 2021-06-08 DIAGNOSIS — I2510 Atherosclerotic heart disease of native coronary artery without angina pectoris: Secondary | ICD-10-CM

## 2021-06-08 MED ORDER — RAMIPRIL 5 MG PO CAPS: 5.0000 mg | ORAL_CAPSULE | Freq: Every day | ORAL | 3 refills | Status: DC

## 2021-06-08 NOTE — Telephone Encounter (Signed)
ICD-10-CM   1. Coronary artery disease involving native coronary artery of native heart without angina pectoris  I25.10 ramipril (ALTACE) 5 MG capsule

## 2021-06-08 NOTE — Telephone Encounter (Signed)
From patient.

## 2021-06-09 ENCOUNTER — Other Ambulatory Visit: Payer: Self-pay | Admitting: Adult Health

## 2021-06-28 ENCOUNTER — Other Ambulatory Visit: Payer: Self-pay | Admitting: Cardiology

## 2021-06-28 DIAGNOSIS — I2510 Atherosclerotic heart disease of native coronary artery without angina pectoris: Secondary | ICD-10-CM

## 2021-06-28 DIAGNOSIS — E7841 Elevated Lipoprotein(a): Secondary | ICD-10-CM

## 2021-06-29 ENCOUNTER — Telehealth: Payer: Self-pay | Admitting: Adult Health

## 2021-06-29 NOTE — Telephone Encounter (Signed)
Left message for patient to call back and schedule Medicare Annual Wellness Visit (AWV) either virtually or in office. Left  my Herbie Drape number [phone]   Last AWV 08/08/20 please schedule at anytime with [facility] Nurse Health Advisor 1 or 2   This should be a 45 minute visit.

## 2021-07-12 ENCOUNTER — Ambulatory Visit (INDEPENDENT_AMBULATORY_CARE_PROVIDER_SITE_OTHER): Payer: Medicare PPO

## 2021-07-12 DIAGNOSIS — Z0000 Encounter for general adult medical examination without abnormal findings: Secondary | ICD-10-CM | POA: Diagnosis not present

## 2021-07-12 NOTE — Patient Instructions (Signed)
Monique Miller , Thank you for taking time to come for your Medicare Wellness Visit. I appreciate your ongoing commitment to your health goals. Please review the following plan we discussed and let me know if I can assist you in the future.   Screening recommendations/referrals: Colonoscopy: 09/24/2013 due 2024 Mammogram: 05/05/2021 Bone Density: 09/22/2018 Recommended yearly ophthalmology/optometry visit for glaucoma screening and checkup Recommended yearly dental visit for hygiene and checkup  Vaccinations: Influenza vaccine: due in fall 2022  Pneumococcal vaccine: completed series  Tdap vaccine: 01/01/19 Shingles vaccine: completed series     Advanced directives: will provide copies   Conditions/risks identified: none   Next appointment: CPE 10/11/2021  0830  Monique Miller    Preventive Care 12 Years and Older, Female Preventive care refers to lifestyle choices and visits with your health care provider that can promote health and wellness. What does preventive care include? A yearly physical exam. This is also called an annual well check. Dental exams once or twice a year. Routine eye exams. Ask your health care provider how often you should have your eyes checked. Personal lifestyle choices, including: Daily care of your teeth and gums. Regular physical activity. Eating a healthy diet. Avoiding tobacco and drug use. Limiting alcohol use. Practicing safe sex. Taking low-dose aspirin every day. Taking vitamin and mineral supplements as recommended by your health care provider. What happens during an annual well check? The services and screenings done by your health care provider during your annual well check will depend on your age, overall health, lifestyle risk factors, and family history of disease. Counseling  Your health care provider may ask you questions about your: Alcohol use. Tobacco use. Drug use. Emotional well-being. Home and relationship well-being. Sexual  activity. Eating habits. History of falls. Memory and ability to understand (cognition). Work and work Statistician. Reproductive health. Screening  You may have the following tests or measurements: Height, weight, and BMI. Blood pressure. Lipid and cholesterol levels. These may be checked every 5 years, or more frequently if you are over 41 years old. Skin check. Lung cancer screening. You may have this screening every year starting at age 52 if you have a 30-pack-year history of smoking and currently smoke or have quit within the past 15 years. Fecal occult blood test (FOBT) of the stool. You may have this test every year starting at age 23. Flexible sigmoidoscopy or colonoscopy. You may have a sigmoidoscopy every 5 years or a colonoscopy every 10 years starting at age 32. Hepatitis C blood test. Hepatitis B blood test. Sexually transmitted disease (STD) testing. Diabetes screening. This is done by checking your blood sugar (glucose) after you have not eaten for a while (fasting). You may have this done every 1-3 years. Bone density scan. This is done to screen for osteoporosis. You may have this done starting at age 94. Mammogram. This may be done every 1-2 years. Talk to your health care provider about how often you should have regular mammograms. Talk with your health care provider about your test results, treatment options, and if necessary, the need for more tests. Vaccines  Your health care provider may recommend certain vaccines, such as: Influenza vaccine. This is recommended every year. Tetanus, diphtheria, and acellular pertussis (Tdap, Td) vaccine. You may need a Td booster every 10 years. Zoster vaccine. You may need this after age 37. Pneumococcal 13-valent conjugate (PCV13) vaccine. One dose is recommended after age 22. Pneumococcal polysaccharide (PPSV23) vaccine. One dose is recommended after age 38. Talk to  your health care provider about which screenings and vaccines  you need and how often you need them. This information is not intended to replace advice given to you by your health care provider. Make sure you discuss any questions you have with your health care provider. Document Released: 11/25/2015 Document Revised: 07/18/2016 Document Reviewed: 08/30/2015 Elsevier Interactive Patient Education  2017 [place] Prevention in the Home Falls can cause injuries. They can happen to people of all ages. There are many things you can do to make your home safe and to help prevent falls. What can I do on the outside of my home? Regularly fix the edges of walkways and driveways and fix any cracks. Remove anything that might make you trip as you walk through a door, such as a raised step or threshold. Trim any bushes or trees on the path to your home. Use bright outdoor lighting. Clear any walking paths of anything that might make someone trip, such as rocks or tools. Regularly check to see if handrails are loose or broken. Make sure that both sides of any steps have handrails. Any raised decks and porches should have guardrails on the edges. Have any leaves, snow, or ice cleared regularly. Use sand or salt on walking paths during winter. Clean up any spills in your garage right away. This includes oil or grease spills. What can I do in the bathroom? Use night lights. Install grab bars by the toilet and in the tub and shower. Do not use towel bars as grab bars. Use non-skid mats or decals in the tub or shower. If you need to sit down in the shower, use a plastic, non-slip stool. Keep the floor dry. Clean up any water that spills on the floor as soon as it happens. Remove soap buildup in the tub or shower regularly. Attach bath mats securely with double-sided non-slip rug tape. Do not have throw rugs and other things on the floor that can make you trip. What can I do in the bedroom? Use night lights. Make sure that you have a light by your bed that  is easy to reach. Do not use any sheets or blankets that are too big for your bed. They should not hang down onto the floor. Have a firm chair that has side arms. You can use this for support while you get dressed. Do not have throw rugs and other things on the floor that can make you trip. What can I do in the kitchen? Clean up any spills right away. Avoid walking on wet floors. Keep items that you use a lot in easy-to-reach places. If you need to reach something above you, use a strong step stool that has a grab bar. Keep electrical cords out of the way. Do not use floor polish or wax that makes floors slippery. If you must use wax, use non-skid floor wax. Do not have throw rugs and other things on the floor that can make you trip. What can I do with my stairs? Do not leave any items on the stairs. Make sure that there are handrails on both sides of the stairs and use them. Fix handrails that are broken or loose. Make sure that handrails are as long as the stairways. Check any carpeting to make sure that it is firmly attached to the stairs. Fix any carpet that is loose or worn. Avoid having throw rugs at the top or bottom of the stairs. If you do have throw rugs, attach  them to the floor with carpet tape. Make sure that you have a light switch at the top of the stairs and the bottom of the stairs. If you do not have them, ask someone to add them for you. What else can I do to help prevent falls? Wear shoes that: Do not have high heels. Have rubber bottoms. Are comfortable and fit you well. Are closed at the toe. Do not wear sandals. If you use a stepladder: Make sure that it is fully opened. Do not climb a closed stepladder. Make sure that both sides of the stepladder are locked into place. Ask someone to hold it for you, if possible. Clearly mark and make sure that you can see: Any grab bars or handrails. First and last steps. Where the edge of each step is. Use tools that help you  move around (mobility aids) if they are needed. These include: Canes. Walkers. Scooters. Crutches. Turn on the lights when you go into a dark area. Replace any light bulbs as soon as they burn out. Set up your furniture so you have a clear path. Avoid moving your furniture around. If any of your floors are uneven, fix them. If there are any pets around you, be aware of where they are. Review your medicines with your doctor. Some medicines can make you feel dizzy. This can increase your chance of falling. Ask your doctor what other things that you can do to help prevent falls. This information is not intended to replace advice given to you by your health care provider. Make sure you discuss any questions you have with your health care provider. Document Released: 08/25/2009 Document Revised: 04/05/2016 Document Reviewed: 12/03/2014 Elsevier Interactive Patient Education  2017 [employer].

## 2021-07-12 NOTE — Progress Notes (Signed)
Subjective:   Kaliegh Arment is a 70 y.o. female who presents for Medicare Annual (Subsequent) preventive examination.   Virtual Visit via Video Note  I connected with Oasis Fredrich  by a video enabled telemedicine application and verified that I am speaking with the correct person using two identifiers.  Location: Patient: Home Provider: Office Persons participating in the virtual visit: patient, provider   I discussed the limitations of evaluation and management by telemedicine and the availability of in person appointments. The patient expressed understanding and agreed to proceed.     Mickel Baas Mareo Portilla,LPN  Review of Systems    N/a       Objective:    There were no vitals filed for this visit. There is no height or weight on file to calculate BMI.  Advanced Directives 08/08/2020 04/01/2017 03/08/2017 03/07/2017 02/27/2017 03/08/2016 08/06/2014  Does Patient Have a Medical Advance Directive? Yes No;Yes Yes Yes Yes Yes Yes  Type of Paramedic of [place];Living will [place];Living will [place];Living will [place];Living will [place];Living will [place];Living will Living will  Does patient want to make changes to medical advance directive? No - Patient declined - No - Patient declined - No - Patient declined No - Patient declined No - Patient declined  Copy of [place] in Chart? Yes - validated most recent copy scanned in chart (See row information) No - copy requested No - copy requested Yes No - copy requested No - copy requested No - copy requested    Current Medications (verified) Outpatient Encounter Medications as of 07/12/2021  Medication Sig   aspirin 81 MG tablet Take 81 mg by mouth daily.    calcium-vitamin D (OSCAL WITH D) 250-125 MG-UNIT tablet Take 1 tablet by mouth daily.   celecoxib (CELEBREX) 200 MG capsule TAKE ONE CAPSULE  BY MOUTH DAILY   FLUoxetine (PROZAC) 40 MG capsule Take 2 capsules (80 mg total) by mouth every evening.   Melatonin 10 MG TABS Take 10 mg by mouth at bedtime.   metFORMIN (GLUCOPHAGE) 500 MG tablet TAKE 1/2 TABLET BY MOUTH DAILY WITH BREAKFAST.   metoprolol succinate (TOPROL-XL) 25 MG 24 hr tablet TAKE 1 TABLET BY MOUTH DAILY. TAKE WITH OR IMMEDIATELY FOLLOWING A MEAL.   niacin (NIASPAN) 1000 MG CR tablet TAKE 1 TABLET BY MOUTH TWICE A DAY   Probiotic Product (PROBIOTIC DAILY PO) Take 1 capsule by mouth daily.   ramipril (ALTACE) 5 MG capsule Take 1 capsule (5 mg total) by mouth daily.   rosuvastatin (CRESTOR) 20 MG tablet TAKE 1 TABLET BY MOUTH EVERY DAY   No facility-administered encounter medications on file as of 07/12/2021.    Allergies (verified) Patient has no known allergies.   History: Past Medical History:  Diagnosis Date   Allergy    Anemia    "many years ago"   Anxiety    Arthritis    Cancer ([place])    skin cancer (basal cell)   Constipation by delayed colonic transit    Coronary artery disease    Depression    see Meridith Child psychotherapist and Trey Paula   Dyspnea    occ   Grover's disease    Headache    Hx of migraines    NSTEMI (non-ST elevated myocardial infarction) ([place]) 02/2016   Pneumonia    walking pneumonia   Restless leg syndrome    Thyroid disease    hx of  hypothyroid   Past Surgical History:  Procedure Laterality Date   APPENDECTOMY     AUGMENTATION MAMMAPLASTY Bilateral    Patient doesn't remember date   BACK SURGERY     L4-L5   BREAST SURGERY     augmentation   CARDIAC CATHETERIZATION N/A 03/08/2016   Procedure: Left Heart Cath and Coronary Angiography;  Surgeon: Adrian Prows, MD;  Location: [facility];  Service: Cardiovascular;  Laterality: N/A;   CARDIAC CATHETERIZATION N/A 03/08/2016   Procedure: Coronary Stent Intervention;  Surgeon: Adrian Prows, MD;  Location: [facility];  Service: Cardiovascular;  Laterality: N/A;  [place]olute 3.5x15    CARDIAC CATHETERIZATION N/A 03/08/2016   Procedure: Intravascular Ultrasound/IVUS;  Surgeon: Adrian Prows, MD;  Location: [facility];  Service: Cardiovascular;  Laterality: N/A;  RCA MID   CESAREAN SECTION     x2   COLONOSCOPY     CORONARY ANGIOGRAM  03/08/2016   CORONARY ANGIOPLASTY     DES LAD, DES D1 02/16/16 Bartolo Darter, [place]), DES RCA 03/08/16 [facility])   LAMINECTOMY WITH POSTERIOR LATERAL ARTHRODESIS LEVEL 2 N/A 03/07/2017   Procedure: Lumbar Four-Five, Lumbar Five-Sacral One Posterior Lateral Fusion with Removal of pedicle screws;  Surgeon: Eustace Moore, MD;  Location: [facility];  Service: Neurosurgery;  Laterality: N/A;   Family History  Problem Relation Age of Onset   Cancer Mother        pancreatic   Cancer Maternal Uncle        pancreatic   Cancer Maternal Grandmother        pancreatic   Heart disease Brother    Breast cancer Neg Hx    Social History   Socioeconomic History   Marital status: Married    Spouse name: Not on file   Number of children: 2   Years of education: Not on file   Highest education level: Not on file  Occupational History   Not on file  Tobacco Use   Smoking status: Never   Smokeless tobacco: Never  Vaping Use   Vaping Use: Never used  Substance and Sexual Activity   Alcohol use: Yes    Alcohol/week: 7.0 standard drinks    Types: 7 Glasses of wine per week    Comment: occassionally   Drug use: No   Sexual activity: Not on file  Other Topics Concern   Not on file  Social History Narrative   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    Social Determinants of Health   Financial Resource Strain: Low Risk    Difficulty of Paying Living Expenses: Not hard at all  Food Insecurity: No Food Insecurity   Worried About Charity fundraiser in the Last Year: Never true   [place] in the Last Year: Never true  Transportation Needs: No Transportation Needs   Lack of Transportation (Medical): No   Lack of Transportation (Non-Medical): No  Physical Activity: Insufficiently Active   Days of Exercise per Week: 3 days   Minutes of Exercise per Session: 40 min  Stress: No Stress Concern Present   Feeling of Stress : Not at all  Social Connections: Moderately Integrated   Frequency of Communication with Friends  and Family: More than three times a week   Frequency of Social Gatherings with Friends and Family: Once a week   Attends Religious Services: More than 4 times per year   Active Member of [employer] or Organizations: No   Attends Archivist Meetings: Never   Marital Status: Married    Tobacco Counseling Counseling given: Not Answered   Clinical Intake:                 Diabetic?no         Activities of Daily Living In your present state of health, do you have any difficulty performing the following activities: 08/08/2020  Hearing? Y  Comment has bilateral hearing aids  Vision? N  Difficulty concentrating or making decisions? N  Walking or climbing stairs? N  Dressing or bathing? N  Doing errands, shopping? N  Preparing Food and eating ? N  Using the Toilet? N  In the past six months, have you accidently leaked urine? Y  Comment has occassional bladder leakage take Mybetriq which helps  Do you have problems with loss of bowel control? N  Managing your Medications? N  Managing your Finances? N  Housekeeping or managing your Housekeeping? N  Some recent data might be hidden    Patient Care Team: [name], NP as PCP - General (Family Medicine) Adrian Prows, MD as Consulting Physician (Cardiology) Almedia Balls, MD as Referring Physician (Orthopedic Surgery)  Indicate any recent Medical Services you may have received from other than [facility] providers in the past year (date may be approximate).     Assessment:   This  is a routine wellness examination for [place].  Hearing/Vision screen No results found.  Dietary issues and exercise activities discussed:     Goals Addressed   None    Depression Screen PHQ 2/9 Scores 09/27/2020 08/08/2020  PHQ - 2 Score 1 0  PHQ- 9 Score 3 0    Fall Risk Fall Risk  09/27/2020 08/08/2020 04/12/2016  Falls in the past year? 1 1 No  Number falls in past yr: 0 0 -  Comment - fell out of a chair outside, no injury -  Injury with Fall? 0 0 -  Risk for fall due to : - Medication side effect -  Follow up - Falls evaluation completed;Falls prevention discussed -    FALL RISK PREVENTION PERTAINING TO THE HOME:  Any stairs in or around the home? Yes  If so, are there any without handrails? No  Home free of loose throw rugs in walkways, pet beds, electrical cords, etc? Yes  Adequate lighting in your home to reduce risk of falls? Yes   ASSISTIVE DEVICES UTILIZED TO PREVENT FALLS:  Life alert? No  Use of a cane, walker or w/c? No  Grab bars in the bathroom? No  Shower chair or bench in shower? No  Elevated toilet seat or a handicapped toilet? No    Cognitive Function:    Normal cognitive status assessed by direct observation by this Nurse Health Advisor. No abnormalities found.      Immunizations Immunization History  Administered Date(s) Administered   Influenza, Quadrivalent, Recombinant, Inj, Pf 08/27/2019   Influenza,inj,Quad PF,6+ Mos 08/29/2017, 08/26/2018   Influenza-Unspecified 07/13/2020   PFIZER(Purple Top)SARS-COV-2 Vaccination 12/02/2019, 12/23/2019, 02/10/2021   Pneumococcal Polysaccharide-23 08/26/2018, 08/26/2018   Tdap 01/01/2019   Zoster Recombinat (Shingrix) 09/26/2018, 12/25/2018, 12/25/2018    TDAP status: Up to date  Flu Vaccine status: Up to date  Pneumococcal vaccine status: Up  to date  Covid-19 vaccine status: Completed vaccines  Qualifies for Shingles Vaccine? Yes   Zostavax completed Yes   Shingrix Completed?:  Yes  Screening Tests Health Maintenance  Topic Date Due   COVID-19 Vaccine (4 - Booster for Pfizer series) 06/12/2021   INFLUENZA VACCINE  06/12/2021   PNA vac Low Risk Adult (1 of 2 - PCV13) 05/10/2022 (Originally 08/27/2019)   MAMMOGRAM  05/06/2023   COLONOSCOPY (Pts 45-17yr Insurance coverage will need to be confirmed)  09/25/2023   TETANUS/TDAP  01/01/2029   DEXA SCAN  Completed   Hepatitis C Screening  Completed   Zoster Vaccines- Shingrix  Completed   HPV VACCINES  Aged Out    Health Maintenance  Health Maintenance Due  Topic Date Due   COVID-19 Vaccine (4 - Booster for [employer] [place]) 06/12/2021   INFLUENZA VACCINE  06/12/2021    Colorectal cancer screening: Type of screening: Colonoscopy. Completed 09/24/2013. Repeat every 10 years  Mammogram status: Completed 05/05/2021. Repeat every year  Bone Density status: Completed 09/22/2018. Results reflect: Bone density results: OSTEOPENIA. Repeat every 5 years.  Lung Cancer Screening: (Low Dose CT Chest recommended if Age 68-80years, 30 pack-year currently smoking OR have quit w/in 15years.) does not qualify.   Lung Cancer Screening Referral: n/a  Additional Screening:  Hepatitis C Screening: does not qualify; Completed 09/18/2018  Vision Screening: Recommended annual ophthalmology exams for early detection of glaucoma and other disorders of the eye. Is the patient up to date with their annual eye exam?  Yes  Who is the provider or what is the name of the office in which the patient attends annual eye exams? [facility] If pt is not established with a provider, would they like to be referred to a provider to establish care? No .   Dental Screening: Recommended annual dental exams for proper oral hygiene  Community Resource Referral / Chronic Care Management: CRR required this visit?  No   CCM required this visit?  No      Plan:     I have personally reviewed and noted the following in the patient's chart:    Medical and social history Use of alcohol, tobacco or illicit drugs  Current medications and supplements including opioid prescriptions.  Functional ability and status Nutritional status Physical activity Advanced directives List of other physicians Hospitalizations, surgeries, and ER visits in previous 12 months Vitals Screenings to include cognitive, depression, and falls Referrals and appointments  In addition, I have reviewed and discussed with patient certain preventive protocols, quality metrics, and best practice recommendations. A written personalized care plan for preventive services as well as general preventive health recommendations were provided to patient.     LRandel Pigg LPN   [id number]  Nurse Notes: none

## 2021-07-14 ENCOUNTER — Telehealth: Payer: Self-pay | Admitting: Cardiology

## 2021-07-14 NOTE — Telephone Encounter (Signed)
ON CALL CARDIOLOGY   [employer] called to make sure patient is on statin given her CAD history.   Records reviewed Human informed that she is on Crestor '20mg'$  po qhs.   No additioanl follow up needed.   May call [phone] if questions arise.   Rex Kras, [place], [facility]  Pager: [phone] Office: [phone]

## 2021-07-21 ENCOUNTER — Other Ambulatory Visit: Payer: Self-pay | Admitting: Adult Health

## 2021-08-14 ENCOUNTER — Other Ambulatory Visit: Payer: Self-pay | Admitting: Adult Health

## 2021-08-15 ENCOUNTER — Encounter: Payer: Self-pay | Admitting: Adult Health

## 2021-08-16 MED ORDER — FLUOXETINE HCL 40 MG PO CAPS: 80.0000 mg | ORAL_CAPSULE | Freq: Every evening | ORAL | 1 refills | Status: DC

## 2021-08-16 NOTE — Telephone Encounter (Signed)
Ok to refill 

## 2021-08-30 ENCOUNTER — Encounter: Payer: Self-pay | Admitting: Adult Health

## 2021-10-03 ENCOUNTER — Encounter: Payer: Self-pay | Admitting: Adult Health

## 2021-10-04 NOTE — Telephone Encounter (Signed)
FYI

## 2021-10-11 ENCOUNTER — Encounter: Payer: Medicare PPO | Admitting: Adult Health

## 2021-10-18 ENCOUNTER — Ambulatory Visit (INDEPENDENT_AMBULATORY_CARE_PROVIDER_SITE_OTHER): Payer: Medicare PPO | Admitting: Adult Health

## 2021-10-18 ENCOUNTER — Encounter: Payer: Self-pay | Admitting: Adult Health

## 2021-10-18 VITALS — BP 120/68 | HR 53 | Temp 97.1°F | Ht 64.5 in | Wt 160.0 lb

## 2021-10-18 DIAGNOSIS — R7303 Prediabetes: Secondary | ICD-10-CM

## 2021-10-18 DIAGNOSIS — Z23 Encounter for immunization: Secondary | ICD-10-CM

## 2021-10-18 DIAGNOSIS — F419 Anxiety disorder, unspecified: Secondary | ICD-10-CM

## 2021-10-18 DIAGNOSIS — I2510 Atherosclerotic heart disease of native coronary artery without angina pectoris: Secondary | ICD-10-CM | POA: Diagnosis not present

## 2021-10-18 DIAGNOSIS — F32A Depression, unspecified: Secondary | ICD-10-CM | POA: Diagnosis not present

## 2021-10-18 DIAGNOSIS — M8589 Other specified disorders of bone density and structure, multiple sites: Secondary | ICD-10-CM | POA: Diagnosis not present

## 2021-10-18 DIAGNOSIS — Z0000 Encounter for general adult medical examination without abnormal findings: Secondary | ICD-10-CM | POA: Diagnosis not present

## 2021-10-18 NOTE — Progress Notes (Signed)
Subjective:    Patient ID: Monique Miller, female    DOB: 1951/03/18, 70 y.o.   MRN: [id number]  HPI  Patient presents for yearly preventative medicine examination. She is a pleasant 71 year old female who  has a past medical history of Allergy, Anemia, Anxiety, Arthritis, Cancer ([place]), Constipation by delayed colonic transit, Coronary artery disease, Depression, Dyspnea, Grover's disease, Headache, migraines, NSTEMI (non-ST elevated myocardial infarction) ([place]) (02/2016), Pneumonia, Restless leg syndrome, and Thyroid disease.  CAD-history of heart cath and coronary angioplasty in 2017.  Currently prescribed Crestor 20 mg daily, aspirin 81 mg and metoprolol 25 mg extended release and ramipril 5 mg daily.   She is managed by cardiology on a yearly basis Lab Results  Component Value Date   CHOL 141 08/17/2020   HDL 72 08/17/2020   LDLCALC 60 08/17/2020   LDLDIRECT 115.5 11/09/2011   TRIG 33 08/17/2020   CHOLHDL 2 09/18/2018   Anxiety/depression- She takes Prozac 80 mg daily.  Feels controlled but has a lot going on right now. Her mother in law passed away recently and her father in law had to be put into a nursing home. She and her husband have had to spend a lot of time in [place].   Glucose intolerance-is currently prescribed metformin 250 mg daily. Lab Results  Component Value Date   HGBA1C 6.2 (H) 08/17/2020   Osteopenia -last bone density in November 2019 showed a low BMD with a T score of -1.5.  She does take Os-Cal daily.  Osteoarthritis - history of previous lumbar spine surgery. Takes Celebrex 200 mg PRN which she finds helps significantly.   All immunizations and health maintenance protocols were reviewed with the patient and needed orders were placed.  Appropriate screening laboratory values were ordered for the patient including screening of hyperlipidemia, renal function and hepatic function.  Medication reconciliation,  past medical history, social history, problem  list and allergies were reviewed in detail with the patient  Goals were established with regard to weight loss, exercise, and  diet in compliance with medications Wt Readings from Last 3 Encounters:  10/18/21 160 lb (72.6 kg)  05/10/21 150 lb (68 kg)  03/21/21 163 lb 12.8 oz (74.3 kg)    Review of Systems  Constitutional: Negative.   HENT: Negative.    Eyes: Negative.   Respiratory: Negative.    Cardiovascular: Negative.   Gastrointestinal: Negative.   Endocrine: Negative.   Genitourinary: Negative.   Musculoskeletal:  Positive for back pain (chronic).  Skin: Negative.   Allergic/Immunologic: Negative.   Neurological: Negative.   Hematological: Negative.   Psychiatric/Behavioral: Negative.     Past Medical History:  Diagnosis Date   Allergy    Anemia    "many years ago"   Anxiety    Arthritis    Cancer ([place])    skin cancer (basal cell)   Constipation by delayed colonic transit    Coronary artery disease    Depression    see Meridith Child psychotherapist and Trey Paula   Dyspnea    occ   Grover's disease    Headache    Hx of migraines    NSTEMI (non-ST elevated myocardial infarction) ([place]) 02/2016   Pneumonia    walking pneumonia   Restless leg syndrome    Thyroid disease    hx of hypothyroid    Social History   Socioeconomic History   Marital status: Married    Spouse name: Not on file   Number of children: 2  Years of education: Not on file   Highest education level: Master's degree (e.g., MA, MS, MEng, MEd, MSW, MBA)  Occupational History   Not on file  Tobacco Use   Smoking status: Never   Smokeless tobacco: Never  Vaping Use   Vaping Use: Never used  Substance and Sexual Activity   Alcohol use: Yes    Alcohol/week: 7.0 standard drinks    Types: 7 Glasses of wine per week    Comment: occassionally   Drug use: No   Sexual activity: Not on file  Other Topics Concern   Not on file  Social History Narrative   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    Social Determinants of Health   Financial Resource Strain: Low Risk    Difficulty of Paying Living Expenses: Not hard at all  Food Insecurity: No Food Insecurity   Worried About Charity fundraiser in the Last Year: Never true   [place] in the Last Year: Never true  Transportation Needs: No Transportation Needs   Lack of Transportation (Medical): No   Lack of Transportation (Non-Medical): No  Physical Activity: Insufficiently Active   Days of Exercise per Week: 2 days   Minutes of Exercise per Session: 20 min  Stress: Stress Concern Present   Feeling of Stress : To some extent  Social Connections: Moderately Integrated   Frequency of Communication with Friends and Family: More than three times a week   Frequency of Social Gatherings with Friends and Family: Three times a week   Attends Religious Services: 1 to 4 times per year   Active Member of Clubs or Organizations: No   Attends Archivist Meetings: Never   Marital Status: Married  Human resources officer Violence: Not At Risk   Fear of Current or Ex-Partner: No   Emotionally Abused: No   Physically Abused: No   Sexually Abused: No    Past Surgical History:  Procedure Laterality Date   APPENDECTOMY     AUGMENTATION MAMMAPLASTY Bilateral    Patient doesn't remember date   BACK SURGERY     L4-L5   BREAST SURGERY     augmentation   CARDIAC CATHETERIZATION N/A 03/08/2016   Procedure: Left Heart Cath and Coronary Angiography;  Surgeon: Adrian Prows, MD;  Location: [facility];  Service: Cardiovascular;  Laterality: N/A;   CARDIAC CATHETERIZATION N/A 03/08/2016   Procedure: Coronary Stent Intervention;  Surgeon: Adrian Prows, MD;  Location: [facility];  Service: Cardiovascular;  Laterality: N/A;  Resolute 3.5x15   CARDIAC  CATHETERIZATION N/A 03/08/2016   Procedure: Intravascular Ultrasound/IVUS;  Surgeon: Adrian Prows, MD;  Location: [place] [facility];  Service: Cardiovascular;  Laterality: N/A;  RCA MID   CESAREAN SECTION     x2   COLONOSCOPY     CORONARY ANGIOGRAM  03/08/2016   CORONARY ANGIOPLASTY     DES LAD, DES D1 02/16/16 Bartolo Darter, ), DES RCA 03/08/16 [facility])   LAMINECTOMY WITH POSTERIOR LATERAL ARTHRODESIS LEVEL 2 N/A 03/07/2017   Procedure: Lumbar Four-Five, Lumbar Five-Sacral One Posterior Lateral Fusion with Removal of pedicle screws;  Surgeon: Eustace Moore, MD;  Location: [place];  Service: Neurosurgery;  Laterality: N/A;  Family History  Problem Relation Age of Onset   Cancer Mother        pancreatic   Cancer Maternal Uncle        pancreatic   Cancer Maternal Grandmother        pancreatic   Heart disease Brother    Breast cancer Neg Hx     No Known Allergies  Current Outpatient Medications on File Prior to Visit  Medication Sig Dispense Refill   aspirin 81 MG tablet Take 81 mg by mouth daily.      calcium-vitamin D (OSCAL WITH D) 250-125 MG-UNIT tablet Take 4 tablets by mouth daily. Pt is taking 2 tabs in the am and 2 tabs in the pm     celecoxib (CELEBREX) 200 MG capsule TAKE ONE CAPSULE BY MOUTH DAILY 90 capsule 0   FLUoxetine (PROZAC) 40 MG capsule Take 2 capsules (80 mg total) by mouth every evening. 180 capsule 1   Melatonin 10 MG TABS Take 10 mg by mouth at bedtime.     metFORMIN (GLUCOPHAGE) 500 MG tablet TAKE 1/2 TABLET BY MOUTH DAILY WITH BREAKFAST. 45 tablet 0   metoprolol succinate (TOPROL-XL) 25 MG 24 hr tablet TAKE 1 TABLET BY MOUTH DAILY. TAKE WITH OR IMMEDIATELY FOLLOWING A MEAL. 90 tablet 3   niacin (NIASPAN) 1000 MG CR tablet TAKE 1 TABLET BY MOUTH TWICE A DAY (Patient taking differently: Pt is taking 1 tab daily) 180 tablet 3   Probiotic Product (PROBIOTIC DAILY PO) Take 1 capsule by mouth daily.     ramipril (ALTACE) 5 MG capsule Take 1 capsule (5 mg total) by mouth  daily. 90 capsule 3   rosuvastatin (CRESTOR) 20 MG tablet TAKE 1 TABLET BY MOUTH EVERY DAY 90 tablet 3   No current facility-administered medications on file prior to visit.    BP 120/68   Pulse (!) 53   Temp (!) 97.1 F (36.2 C) (Oral)   Ht 5' 4.5" (1.638 m)   Wt 160 lb (72.6 kg)   SpO2 99%   BMI 27.04 kg/m        Objective:   Physical Exam Vitals and nursing note reviewed.  Constitutional:      General: She is not in acute distress.    Appearance: Normal appearance. She is well-developed. She is not ill-appearing.  HENT:     Head: Normocephalic and atraumatic.     Right Ear: Tympanic membrane, ear canal and external ear normal. There is no impacted cerumen.     Left Ear: Tympanic membrane, ear canal and external ear normal. There is no impacted cerumen.     Nose: Nose normal. No congestion or rhinorrhea.     Mouth/Throat:     Mouth: Mucous membranes are moist.     Pharynx: Oropharynx is clear. No oropharyngeal exudate or posterior oropharyngeal erythema.  Eyes:     General:        Right eye: No discharge.        Left eye: No discharge.     Extraocular Movements: Extraocular movements intact.     Conjunctiva/sclera: Conjunctivae normal.     Pupils: Pupils are equal, round, and reactive to light.  Neck:     Thyroid: No thyromegaly.     Vascular: No carotid bruit.     Trachea: No tracheal deviation.  Cardiovascular:     Rate and Rhythm: Normal rate and regular rhythm.     Pulses: Normal pulses.     Heart sounds: Normal heart sounds. No  murmur heard.   No friction rub. No gallop.  Pulmonary:     Effort: Pulmonary effort is normal. No respiratory distress.     Breath sounds: Normal breath sounds. No stridor. No wheezing, rhonchi or rales.  Chest:     Chest wall: No tenderness.  Abdominal:     General: Abdomen is flat. Bowel sounds are normal. There is no distension.     Palpations: Abdomen is soft. There is no mass.     Tenderness: There is no abdominal  tenderness. There is no right CVA tenderness, left CVA tenderness, guarding or rebound.     Hernia: No hernia is present.  Musculoskeletal:        General: No swelling, tenderness, deformity or signs of injury. Normal range of motion.     Cervical back: Normal range of motion and neck supple.     Right lower leg: No edema.     Left lower leg: No edema.  Lymphadenopathy:     Cervical: No cervical adenopathy.  Skin:    General: Skin is warm and dry.     Coloration: Skin is not jaundiced or pale.     Findings: No bruising, erythema, lesion or rash.  Neurological:     General: No focal deficit present.     Mental Status: She is alert and oriented to person, place, and time.     Cranial Nerves: No cranial nerve deficit.     Sensory: No sensory deficit.     Motor: No weakness.     Coordination: Coordination normal.     Gait: Gait normal.     Deep Tendon Reflexes: Reflexes normal.  Psychiatric:        Mood and Affect: Mood normal.        Behavior: Behavior normal.        Thought Content: Thought content normal.        Judgment: Judgment normal.      Assessment & Plan:  1. Routine general medical examination at a health care facility - Follow up in one year or sooner if needed - CBC with Differential/Platelet; Future - Comprehensive metabolic panel; Future - Hemoglobin A1c; Future - Lipid panel; Future - TSH; Future  2. Coronary artery disease involving native coronary artery of native heart without angina pectoris - continue with plan of care by Cardiology  - CBC with Differential/Platelet; Future - Comprehensive metabolic panel; Future - Hemoglobin A1c; Future - Lipid panel; Future - TSH; Future  3. Anxiety - Continue with Prozac 80 mg   4. Depression, unspecified depression type - Continue with Prozac 80 mg.  - PHQ9 score 13 today. Will not put her on any additional medications at this time   5. Pre-diabetes - Consider increase in metformin  - CBC with  Differential/Platelet; Future - Comprehensive metabolic panel; Future - Hemoglobin A1c; Future - Lipid panel; Future - TSH; Future  6. Osteopenia of multiple sites  - VITAMIN D 25 Hydroxy (Vit-D Deficiency, Fractures); Future - DG Bone Density; Future  7. Need for pneumococcal vaccination  - Pneumococcal conjugate vaccine 13-valent   [name], NP

## 2021-10-18 NOTE — Patient Instructions (Signed)
It was great seeing you today   Please follow up for your labs and once I get them back   I will see you back in one year or sooner if needed

## 2021-10-20 ENCOUNTER — Encounter: Payer: Self-pay | Admitting: Adult Health

## 2021-10-20 ENCOUNTER — Other Ambulatory Visit (INDEPENDENT_AMBULATORY_CARE_PROVIDER_SITE_OTHER): Payer: Medicare PPO

## 2021-10-20 DIAGNOSIS — Z0000 Encounter for general adult medical examination without abnormal findings: Secondary | ICD-10-CM

## 2021-10-20 DIAGNOSIS — M8589 Other specified disorders of bone density and structure, multiple sites: Secondary | ICD-10-CM | POA: Diagnosis not present

## 2021-10-20 DIAGNOSIS — I2510 Atherosclerotic heart disease of native coronary artery without angina pectoris: Secondary | ICD-10-CM

## 2021-10-20 DIAGNOSIS — R7303 Prediabetes: Secondary | ICD-10-CM

## 2021-10-20 LAB — LIPID PANEL
Cholesterol: 131 mg/dL (ref 0–200)
HDL: 66.3 mg/dL (ref >39.00)
LDL Cholesterol: 56 mg/dL (ref 0–99)
NonHDL: 65
Total CHOL/HDL Ratio: 2
Triglycerides: 46 mg/dL (ref 0.0–149.0)
VLDL: 9.2 mg/dL (ref 0.0–40.0)

## 2021-10-20 LAB — COMPREHENSIVE METABOLIC PANEL
ALT: 22 U/L (ref 0–35)
AST: 30 U/L (ref 0–37)
Albumin: 4 g/dL (ref 3.5–5.2)
Alkaline Phosphatase: 48 U/L (ref 39–117)
BUN: 19 mg/dL (ref 6–23)
CO2: 25 mEq/L (ref 19–32)
Calcium: 9.1 mg/dL (ref 8.4–10.5)
Chloride: 103 mEq/L (ref 96–112)
Creatinine, Ser: 0.91 mg/dL (ref 0.40–1.20)
GFR: 65.01 mL/min (ref >60.00)
Glucose, Bld: 94 mg/dL (ref 70–99)
Potassium: 4.3 mEq/L (ref 3.5–5.1)
Sodium: 137 mEq/L (ref 135–145)
Total Bilirubin: 0.7 mg/dL (ref 0.2–1.2)
Total Protein: 6.6 g/dL (ref 6.0–8.3)

## 2021-10-20 LAB — CBC WITH DIFFERENTIAL/PLATELET
Basophils Absolute: 0 10*3/uL (ref 0.0–0.1)
Basophils Relative: 0.8 % (ref 0.0–3.0)
Eosinophils Absolute: 0.2 10*3/uL (ref 0.0–0.7)
Eosinophils Relative: 4 % (ref 0.0–5.0)
HCT: 41.2 % (ref 36.0–46.0)
Hemoglobin: 13.9 g/dL (ref 12.0–15.0)
Lymphocytes Relative: 35.1 % (ref 12.0–46.0)
Lymphs Abs: 1.6 10*3/uL (ref 0.7–4.0)
MCHC: 33.6 g/dL (ref 30.0–36.0)
MCV: 91.7 fl (ref 78.0–100.0)
Monocytes Absolute: 0.4 10*3/uL (ref 0.1–1.0)
Monocytes Relative: 8.8 % (ref 3.0–12.0)
Neutro Abs: 2.3 10*3/uL (ref 1.4–7.7)
Neutrophils Relative %: 51.3 % (ref 43.0–77.0)
Platelets: 174 10*3/uL (ref 150.0–400.0)
RBC: 4.5 Mil/uL (ref 3.87–5.11)
RDW: 13.2 % (ref 11.5–15.5)
WBC: 4.5 10*3/uL (ref 4.0–10.5)

## 2021-10-20 LAB — HEMOGLOBIN A1C: Hgb A1c MFr Bld: 6.2 % (ref 4.6–6.5)

## 2021-10-20 LAB — VITAMIN D 25 HYDROXY (VIT D DEFICIENCY, FRACTURES): VITD: 38.78 ng/mL (ref 30.00–100.00)

## 2021-10-20 LAB — TSH: TSH: 2.63 u[IU]/mL (ref 0.35–5.50)

## 2021-10-20 NOTE — Addendum Note (Signed)
Addended by: Octavio Manns E on: 10/20/2021 09:04 AM   Modules accepted: Orders

## 2021-10-31 ENCOUNTER — Other Ambulatory Visit: Payer: Self-pay

## 2021-10-31 DIAGNOSIS — E782 Mixed hyperlipidemia: Secondary | ICD-10-CM

## 2021-10-31 MED ORDER — ROSUVASTATIN CALCIUM 20 MG PO TABS: 20.0000 mg | ORAL_TABLET | Freq: Every day | ORAL | 3 refills | Status: DC

## 2021-12-15 ENCOUNTER — Telehealth: Payer: Self-pay | Admitting: Adult Health

## 2021-12-15 ENCOUNTER — Other Ambulatory Visit: Payer: Self-pay

## 2021-12-15 MED ORDER — FLUOXETINE HCL 40 MG PO CAPS: 80.0000 mg | ORAL_CAPSULE | Freq: Every evening | ORAL | 1 refills | Status: DC

## 2021-12-15 MED ORDER — METFORMIN HCL 500 MG PO TABS: ORAL_TABLET | ORAL | 0 refills | Status: DC

## 2021-12-15 MED ORDER — CELECOXIB 200 MG PO CAPS: 200.0000 mg | ORAL_CAPSULE | Freq: Every day | ORAL | 0 refills | Status: DC

## 2021-12-15 NOTE — Telephone Encounter (Signed)
Addition** prescription they are requesting is for FLUoxetine (PROZAC) 40 MG capsule (Expired)

## 2021-12-15 NOTE — Progress Notes (Signed)
Noted Rx sent to pharmacy  

## 2021-12-15 NOTE — Telephone Encounter (Signed)
[facility] tech with  [facility] called because patient is in [place] with her husband who is in the hospital. Patient did not bring enough of her and she has ran out. [facility] other two [facility] on [facility] but they do not have any refills. [facility] for patient while she is down there with her husband.   [facility]   [id number] [address], [place], [place] [address]

## 2021-12-15 NOTE — Telephone Encounter (Signed)
Please advise 

## 2021-12-15 NOTE — Progress Notes (Signed)
Patient notified of update  and verbalized understanding. 

## 2021-12-15 NOTE — Progress Notes (Signed)
Ok to fill 

## 2021-12-17 ENCOUNTER — Encounter: Payer: Self-pay | Admitting: Cardiology

## 2021-12-18 NOTE — Telephone Encounter (Signed)
From patient.

## 2021-12-19 NOTE — Telephone Encounter (Signed)
From pt

## 2021-12-21 ENCOUNTER — Other Ambulatory Visit: Payer: Self-pay | Admitting: Cardiology

## 2021-12-21 DIAGNOSIS — E782 Mixed hyperlipidemia: Secondary | ICD-10-CM

## 2021-12-25 ENCOUNTER — Encounter: Payer: Self-pay | Admitting: Cardiology

## 2021-12-26 NOTE — Telephone Encounter (Signed)
From patient.

## 2022-02-19 ENCOUNTER — Encounter: Payer: Self-pay | Admitting: Family Medicine

## 2022-02-19 ENCOUNTER — Telehealth (INDEPENDENT_AMBULATORY_CARE_PROVIDER_SITE_OTHER): Payer: Medicare PPO | Admitting: Family Medicine

## 2022-02-19 VITALS — Ht 64.5 in | Wt 160.0 lb

## 2022-02-19 DIAGNOSIS — J302 Other seasonal allergic rhinitis: Secondary | ICD-10-CM | POA: Diagnosis not present

## 2022-02-19 DIAGNOSIS — H1033 Unspecified acute conjunctivitis, bilateral: Secondary | ICD-10-CM

## 2022-02-19 NOTE — Progress Notes (Signed)
Virtual Visit via Video Note ? ?I connected with Monique Miller on 02/19/22 at 11:45 AM EDT by a video enabled telemedicine application 2/2 JIRCV-89 pandemic and verified that I am speaking with the correct person using two identifiers. ? Location patient: home ?Location provider:work or home office ?Persons participating in the virtual visit: patient, provider ? ?I discussed the limitations of evaluation and management by telemedicine and the availability of in person appointments. The patient expressed understanding and agreed to proceed. ? ?Chief Complaint  ?Patient presents with  ? Nasal Congestion  ? Conjunctivitis  ? ? ?HPI: ?Pt is a 71 yo female with pmh sig for allergies, h/o anxiety, h/o depression, migrainages, CAD,  h/o NSTEMI, thyroid dz, RLS, spinal stenosis who s followed by [name], NP and seen for acute concern.   ? ?Pt states she is having sinus issues.  Endorses cough x a couple of days.  Using cough gtts.  R eye was swollen yesterday, started itching today.  Pt used her grandson's eye gtts for pink eye which helped some.  Took an allergy pill yesterday which helped.  L eye started becoming irritated today.  Both eyes feel dry, have clear drainage at night, and crusting in am.   Endorses rhinorrhea and fatigue. ?Denies fever, chills, ST, ear pain/pressure, diarrhea, N/V.   ? ?ROS: See pertinent positives and negatives per HPI. ? ?Past Medical History:  ?Diagnosis Date  ? Allergy   ? Anemia   ? "many years ago"  ? Anxiety   ? Arthritis   ? Cancer [facility])   ? skin cancer (basal cell)  ? Constipation by delayed colonic transit   ? Coronary artery disease   ? Depression   ? see Meridith Child psychotherapist and Trey Paula  ? Dyspnea   ? occ  ? Grover's disease   ? Headache   ? Hx of migraines   ? NSTEMI (non-ST elevated myocardial infarction) ([place]) 02/2016  ? Pneumonia   ? walking pneumonia  ? Restless leg syndrome   ? Thyroid disease   ? hx of hypothyroid  ? ? ?Past Surgical History:  ?Procedure Laterality Date  ?  APPENDECTOMY    ? AUGMENTATION MAMMAPLASTY Bilateral   ? Patient doesn't remember date  ? BACK SURGERY    ? L4-L5  ? BREAST SURGERY    ? augmentation  ? CARDIAC CATHETERIZATION N/A 03/08/2016  ? Procedure: Left Heart Cath and Coronary Angiography;  Surgeon: Adrian Prows, MD;  Location: [place] [facility];  Service: Cardiovascular;  Laterality: N/A;  ? CARDIAC CATHETERIZATION N/A 03/08/2016  ? Procedure: Coronary Stent Intervention;  Surgeon: Adrian Prows, MD;  Location: [place] [facility];  Service: Cardiovascular;  Laterality: N/A;  Resolute 3.5x15  ? CARDIAC CATHETERIZATION N/A 03/08/2016  ? Procedure: Intravascular Ultrasound/IVUS;  Surgeon: Adrian Prows, MD;  Location: [place] [facility];  Service: Cardiovascular;  Laterality: N/A;  RCA MID  ? CESAREAN SECTION    ? x2  ? COLONOSCOPY    ? CORONARY ANGIOGRAM  03/08/2016  ? CORONARY ANGIOPLASTY    ? DES LAD, DES D1 02/16/16 Bartolo Darter, [place]), DES RCA 03/08/16 [facility])  ? LAMINECTOMY WITH POSTERIOR LATERAL ARTHRODESIS LEVEL 2 N/A 03/07/2017  ? Procedure: Lumbar Four-Five, Lumbar Five-Sacral One Posterior Lateral Fusion with Removal of pedicle screws;  Surgeon: Eustace Moore, MD;  Location: [place];  Service: Neurosurgery;  Laterality: N/A;  ? ? ?Family History  ?Problem Relation Age of Onset  ? Cancer Mother   ?  pancreatic  ? Cancer Maternal Uncle   ?     pancreatic  ? Cancer Maternal Grandmother   ?     pancreatic  ? Heart disease Brother   ? Breast cancer Neg Hx   ? ? ?Current Outpatient Medications:  ?  aspirin 81 MG tablet, Take 81 mg by mouth daily. , Disp: , Rfl:  ?  calcium-vitamin D (OSCAL WITH D) 250-125 MG-UNIT tablet, Take 4 tablets by mouth daily. Pt is taking 2 tabs in the am and 2 tabs in the pm, Disp: , Rfl:  ?  celecoxib (CELEBREX) 200 MG capsule, Take 1 capsule (200 mg total) by mouth daily., Disp: 90 capsule, Rfl: 0 ?  FLUoxetine (PROZAC) 40 MG capsule, Take 2 capsules (80 mg total) by mouth every evening., Disp: 180 capsule, Rfl: 1 ?  Melatonin 10 MG TABS, Take  10 mg by mouth at bedtime., Disp: , Rfl:  ?  metFORMIN (GLUCOPHAGE) 500 MG tablet, TAKE 1/2 TABLET BY MOUTH DAILY WITH BREAKFAST., Disp: 45 tablet, Rfl: 0 ?  metoprolol succinate (TOPROL-XL) 25 MG 24 hr tablet, TAKE 1 TABLET BY MOUTH DAILY. TAKE WITH OR IMMEDIATELY FOLLOWING A MEAL., Disp: 90 tablet, Rfl: 3 ?  niacin (NIASPAN) 1000 MG CR tablet, TAKE 1 TABLET BY MOUTH TWICE A DAY (Patient taking differently: Pt is taking 1 tab daily), Disp: 180 tablet, Rfl: 3 ?  Probiotic Product (PROBIOTIC DAILY PO), Take 1 capsule by mouth daily., Disp: , Rfl:  ?  ramipril (ALTACE) 5 MG capsule, Take 1 capsule (5 mg total) by mouth daily., Disp: 90 capsule, Rfl: 3 ?  rosuvastatin (CRESTOR) 20 MG tablet, TAKE ONE TABLET BY MOUTH DAILY, Disp: 90 tablet, Rfl: 3 ? ?EXAM: ?  ?VITALS per patient if applicable: RR between 35-70 bpm ? ?GENERAL: alert, oriented, appears well and in no acute distress ? ?HEENT: atraumatic, conjunctiva clear, no obvious abnormalities on inspection of external nose and ears ? ?NECK: normal movements of the head and neck ? ?LUNGS: on inspection no signs of respiratory distress, breathing rate appears normal, no obvious gross SOB, gasping or wheezing ? ?CV: no obvious cyanosis ? ?MS: moves all visible extremities without noticeable abnormality ? ?PSYCH/NEURO: pleasant and cooperative, no obvious depression or anxiety, speech and thought processing grossly intact ? ?ASSESSMENT AND PLAN: ? ?Discussed the following assessment and plan: ? ?Seasonal allergic rhinitis, unspecified trigger ?-Discussed symptoms likely 2/2 allergies as some improvement noted with antihistamine.  Also consider viral etiology. ?-Discussed treatment of symptoms with OTC medications for cough/cold, saline nasal rinse, Flonase nasal spray, rest, hydration ?-Given precautions for continued or worsening symptoms ? ?Acute conjunctivitis of both eyes, unspecified acute conjunctivitis type ?-Discussed various causes of conjunctivitis including  allergic versus viral versus bacterial ?-We will have patient try OTC antihistamine and antihistamine eyedrops. ?-Patient to notify clinic for no improvement or worsening symptoms.  If needed will send in Rx for antibacterial eyedrop.  ? ?Follow-up as needed ?  ?I discussed the assessment and treatment plan with the patient. The patient was provided an opportunity to ask questions and all were answered. The patient agreed with the plan and demonstrated an understanding of the instructions. ?  ?The patient was advised to call back or seek an in-person evaluation if the symptoms worsen or if the condition fails to improve as anticipated. ? ?Billie Ruddy, MD  ? ?

## 2022-02-22 DIAGNOSIS — J018 Other acute sinusitis: Secondary | ICD-10-CM | POA: Diagnosis not present

## 2022-02-22 DIAGNOSIS — H1033 Unspecified acute conjunctivitis, bilateral: Secondary | ICD-10-CM | POA: Diagnosis not present

## 2022-02-22 DIAGNOSIS — B9689 Other specified bacterial agents as the cause of diseases classified elsewhere: Secondary | ICD-10-CM | POA: Diagnosis not present

## 2022-02-23 ENCOUNTER — Ambulatory Visit: Payer: Medicare PPO | Admitting: Physician Assistant

## 2022-02-26 ENCOUNTER — Encounter: Payer: Self-pay | Admitting: Adult Health

## 2022-02-27 DIAGNOSIS — D225 Melanocytic nevi of trunk: Secondary | ICD-10-CM | POA: Diagnosis not present

## 2022-02-27 DIAGNOSIS — L814 Other melanin hyperpigmentation: Secondary | ICD-10-CM | POA: Diagnosis not present

## 2022-02-27 DIAGNOSIS — L821 Other seborrheic keratosis: Secondary | ICD-10-CM | POA: Diagnosis not present

## 2022-02-27 DIAGNOSIS — Z85828 Personal history of other malignant neoplasm of skin: Secondary | ICD-10-CM | POA: Diagnosis not present

## 2022-02-27 DIAGNOSIS — L570 Actinic keratosis: Secondary | ICD-10-CM | POA: Diagnosis not present

## 2022-02-27 DIAGNOSIS — L578 Other skin changes due to chronic exposure to nonionizing radiation: Secondary | ICD-10-CM | POA: Diagnosis not present

## 2022-02-27 DIAGNOSIS — L820 Inflamed seborrheic keratosis: Secondary | ICD-10-CM | POA: Diagnosis not present

## 2022-02-28 NOTE — Telephone Encounter (Signed)
Please advise 

## 2022-03-12 ENCOUNTER — Other Ambulatory Visit: Payer: Self-pay | Admitting: Adult Health

## 2022-03-21 ENCOUNTER — Encounter: Payer: Self-pay | Admitting: Cardiology

## 2022-03-21 ENCOUNTER — Ambulatory Visit: Payer: Medicare PPO | Admitting: Cardiology

## 2022-03-21 VITALS — BP 101/52 | HR 60 | Temp 98.1°F | Resp 18 | Ht 64.5 in | Wt 154.0 lb

## 2022-03-21 DIAGNOSIS — R002 Palpitations: Secondary | ICD-10-CM

## 2022-03-21 DIAGNOSIS — E782 Mixed hyperlipidemia: Secondary | ICD-10-CM | POA: Diagnosis not present

## 2022-03-21 DIAGNOSIS — E7841 Elevated Lipoprotein(a): Secondary | ICD-10-CM

## 2022-03-21 DIAGNOSIS — I2510 Atherosclerotic heart disease of native coronary artery without angina pectoris: Secondary | ICD-10-CM

## 2022-03-21 MED ORDER — RAMIPRIL 2.5 MG PO CAPS: 2.5000 mg | ORAL_CAPSULE | Freq: Every day | ORAL | 3 refills | Status: DC

## 2022-03-21 NOTE — Progress Notes (Signed)
? ?Primary Physician/Referring:  [name], NP ? ?Patient ID: Monique Miller, female    DOB: 1951/08/28, 71 y.o.   MRN: [id number] ? ?Chief Complaint  ?Patient presents with  ? Coronary Artery Disease  ? Hyperlipidemia  ? Follow-up  ?  1 year  ? ?HPI:   ? ?[name]  is a 71 y.o. Caucasian female patient with NSTEMI SP PCI to LAD and D1 in [place], [place] for occluded diagonal and mid LAD in 2016, IVUS guided PCI to the RCA on  03/08/2016.  Past medical history significant for mixed hyperlipidemia with elevated LPA, LDL within normal limits presently tolerating high-dose, high intensity statin along with niacin.  She has family history of premature coronary disease with brother having had MI at age 59. ? ?This is an annual visit. Presently doing well, no chest pian. She has not used S/L NTG recently. Symptoms are remained stable.  Also since being on beta-blockers, she has not had any further palpitations. ? ?Past Medical History:  ?Diagnosis Date  ? Allergy   ? Anemia   ? "many years ago"  ? Anxiety   ? Arthritis   ? Cancer [facility])   ? skin cancer (basal cell)  ? Constipation by delayed colonic transit   ? Coronary artery disease   ? Depression   ? see Meridith Child psychotherapist and Trey Paula  ? Dyspnea   ? occ  ? Grover's disease   ? Headache   ? Hx of migraines   ? NSTEMI (non-ST elevated myocardial infarction) ([place]) 02/2016  ? Pneumonia   ? walking pneumonia  ? Restless leg syndrome   ? Thyroid disease   ? hx of hypothyroid  ? ? ?Social History  ? ?Tobacco Use  ? Smoking status: Never  ? Smokeless tobacco: Never  ?Substance Use Topics  ? Alcohol use: Yes  ?  Alcohol/week: 7.0 standard drinks  ?  Types: 7 Glasses of wine per week  ?  Comment: occassionally  ?Marital Status: Married  ?  ?ROS  ?Review of Systems  ?Cardiovascular:  Negative for chest pain, dyspnea on exertion and leg swelling.  ?Gastrointestinal:  Negative for melena.  ?Objective  ? ? ?  03/21/2022  ?  9:52 AM 02/19/2022  ? 10:52 AM 10/18/2021  ? 10:01  AM  ?Vitals with BMI  ?Height 5' 4.5" 5' 4.5" 5' 4.5"  ?Weight 154 lbs 160 lbs 160 lbs  ?BMI 26.04 27.05 27.05  ?Systolic 270  350  ?Diastolic 52  68  ?Pulse 60  53  ?  ?Blood pressure (!) 101/52, pulse 60, temperature 98.1 ?F (36.7 ?C), temperature source Temporal, resp. rate 18, height 5' 4.5" (1.638 m), weight 154 lb (69.9 kg), SpO2 95 %. Body mass index is 26.03 kg/m?. ?  ?Physical Exam ?Neck:  ?   Vascular: No JVD.  ?Cardiovascular:  ?   Rate and Rhythm: Normal rate and regular rhythm.  ?   Pulses: Intact distal pulses.  ?   Heart sounds: Normal heart sounds. No murmur heard. ?  No gallop.  ?Pulmonary:  ?   Effort: Pulmonary effort is normal.  ?   Breath sounds: Normal breath sounds.  ?Abdominal:  ?   General: Bowel sounds are normal.  ?   Palpations: Abdomen is soft.  ?Musculoskeletal:  ?   Right lower leg: No edema.  ?   Left lower leg: No edema.  ? ?Radiology: ?No results found. ? ? ?Laboratory examination:  ? ? ? Ref Range & Units  08/03/2019  ?Lipoprotein (a) <75.0 nmol/L 120.7High    ? ?Recent Labs  ?  10/20/21 ?2956  ?NA 137  ?K 4.3  ?CL 103  ?CO2 25  ?GLUCOSE 94  ?BUN 19  ?CREATININE 0.91  ?CALCIUM 9.1  ? ? ?  Latest Ref Rng & Units 10/20/2021  ?  9:05 AM 08/17/2020  ?  8:05 AM 08/03/2019  ?  8:45 AM  ?CMP  ?Glucose 70 - 99 mg/dL 94   95   103    ?BUN 6 - 23 mg/dL '19   22   15    '$ ?Creatinine 0.40 - 1.20 mg/dL 0.91   0.95   1.05    ?Sodium 135 - 145 mEq/L 137   140   141    ?Potassium 3.5 - 5.1 mEq/L 4.3   4.8   5.0    ?Chloride 96 - 112 mEq/L 103   103   104    ?CO2 19 - 32 mEq/L '25   24   22    '$ ?Calcium 8.4 - 10.5 mg/dL 9.1   9.3   9.2    ?Total Protein 6.0 - 8.3 g/dL 6.6   6.3   6.2    ?Total Bilirubin 0.2 - 1.2 mg/dL 0.7   0.4   0.5    ?Alkaline Phos 39 - 117 U/L 48   65   54    ?AST 0 - 37 U/L 30   52   36    ?ALT 0 - 35 U/L 22   48   29    ? ? ?  Latest Ref Rng & Units 10/20/2021  ?  9:05 AM 08/17/2020  ?  8:05 AM 09/18/2018  ? 11:24 AM  ?CBC  ?WBC 4.0 - 10.5 K/uL 4.5   7.0   4.5    ?Hemoglobin 12.0 -  15.0 g/dL 13.9   11.1   14.3    ?Hematocrit 36.0 - 46.0 % 41.2   35.7   42.9    ?Platelets 150.0 - 400.0 K/uL 174.0   186   168.0    ? ?Lipid Panel ?Recent Labs  ?  10/20/21 ?2130  ?CHOL 131  ?TRIG 46.0  ?[place] 56  ?VLDL 9.2  ?HDL 66.30  ?CHOLHDL 2  ?  ?HEMOGLOBIN A1C ?Lab Results  ?Component Value Date  ? HGBA1C 6.2 10/20/2021  ? ?TSH ?08/03/2019: Normal. 2.300 ? ?Recent Labs  ?  10/20/21 ?8657  ?TSH 2.63  ? ?Medications and allergies  ? ?No Known Allergies  ? ?Current Outpatient Medications:  ?  aspirin 81 MG tablet, Take 81 mg by mouth daily. , Disp: , Rfl:  ?  calcium-vitamin D (OSCAL WITH D) 250-125 MG-UNIT tablet, Take 4 tablets by mouth daily. Pt is taking 2 tabs in the am and 2 tabs in the pm, Disp: , Rfl:  ?  celecoxib (CELEBREX) 200 MG capsule, TAKE 1 CAPSULE BY MOUTH EVERY DAY (Patient taking differently: Take 200 mg by mouth as needed.), Disp: 90 capsule, Rfl: 0 ?  FLUoxetine (PROZAC) 40 MG capsule, Take 2 capsules (80 mg total) by mouth every evening., Disp: 180 capsule, Rfl: 1 ?  Melatonin 10 MG TABS, Take 10 mg by mouth at bedtime., Disp: , Rfl:  ?  metFORMIN (GLUCOPHAGE) 500 MG tablet, TAKE 1/2 TABLET BY MOUTH DAILY WITH BREAKFAST., Disp: 45 tablet, Rfl: 0 ?  metoprolol succinate (TOPROL-XL) 25 MG 24 hr tablet, TAKE 1 TABLET BY MOUTH DAILY. TAKE WITH OR IMMEDIATELY FOLLOWING A MEAL.,  Disp: 90 tablet, Rfl: 3 ?  niacin (NIASPAN) 1000 MG CR tablet, TAKE 1 TABLET BY MOUTH TWICE A DAY (Patient taking differently: Pt is taking 1 tab daily), Disp: 180 tablet, Rfl: 3 ?  Probiotic Product (PROBIOTIC DAILY PO), Take 1 capsule by mouth daily., Disp: , Rfl:  ?  rosuvastatin (CRESTOR) 20 MG tablet, TAKE ONE TABLET BY MOUTH DAILY, Disp: 90 tablet, Rfl: 3 ?  ramipril (ALTACE) 2.5 MG capsule, Take 1 capsule (2.5 mg total) by mouth daily., Disp: 90 capsule, Rfl: 3  ?  ?Cardiac Studies:  ? ?Coronary angiogram 03/08/2016: PCI to Prox RCA with 3.5x1 mm Resolute. Acute MI 02/16/2016 S/P PCI to LAD and D1 with the 2.75 x 23  mm Xience DES and 2.5 x 15 mm Xience DES respectively. Normal LVEF. ? ?Lexiscan Tetrofosmin stress test 08/31/2020: ?Lexiscan nuclear stress test performed using 1-day protocol. Stress EKG is non-diagnostic, as this is pharmacological stress test. Rest and stress EJG shows sinus rhythm, old anteroseptal infarct.  ?SPECT images show small sized, mild intensity, fixed perfusion defect in apical anterior myocardium. Stress LVEF 74%. ?Low risk study.  ? ?Echocardiogram 08/31/2020: ?Normal LV systolic function with EF 60%. Left ventricle cavity is normal in size. Mild concentric hypertrophy of the left ventricle. Normal global wall motion. Calculated EF 60%. ?Left atrial cavity is mildly dilated. ?Mild (Grade I) mitral regurgitation. ?Mild tricuspid regurgitation. Estimated pulmonary artery systolic pressure 26 mmHg. ? ?EKG  ?EKG 03/21/2022: Sinus bradycardia at rate of 53 bpm with sinus arrhythmia.  Left atrial enlargement, otherwise normal EKG. no significant change from 03/21/2021.  ? ?Assessment:  ? ?  ICD-10-CM   ?1. Coronary artery disease involving native coronary artery of native heart without angina pectoris  I25.10 EKG 12-Lead  ?  ramipril (ALTACE) 2.5 MG capsule  ?  ?2. Palpitations  R00.2   ?  ?3. Mixed hyperlipidemia  E78.2   ?  ?4. Elevated lipoprotein(a)  E78.41   ?  ?  ?Recommendations:  ? ?Basma Buchner  is a 71 y.o. Caucasian female patient with NSTEMI SP PCI to LAD and D1 in [place], [place] for occluded diagonal and mid LAD in 2016, IVUS guided PCI to the RCA on  03/08/2016.  Past medical history significant for mixed hyperlipidemia with elevated LPA, LDL within normal limits presently tolerating high-dose, high intensity statin along with niacin.  She has family history of premature coronary disease with brother having had MI at age 76. ? ?She is presently asymptomatic except for having made significant dietary changes, she has noticed her blood pressure to be very soft and feels dizzy when she  is currently stands up.  I will reduce the dose of Altace from 5 mg to 2.5 mg daily. ? ?Reviewed her labs, lipids under excellent control.  In view of elevated LPA, family history and hyperlipidemia and

## 2022-03-22 ENCOUNTER — Encounter: Payer: Self-pay | Admitting: Adult Health

## 2022-03-22 ENCOUNTER — Ambulatory Visit
Admission: RE | Admit: 2022-03-22 | Discharge: 2022-03-22 | Disposition: A | Discharge status: Home / Self Care | Payer: Medicare PPO | Source: Ambulatory Visit | Attending: Adult Health | Admitting: Adult Health

## 2022-03-22 DIAGNOSIS — M8589 Other specified disorders of bone density and structure, multiple sites: Secondary | ICD-10-CM | POA: Diagnosis not present

## 2022-03-22 DIAGNOSIS — Z780 Asymptomatic menopausal state: Secondary | ICD-10-CM | POA: Diagnosis not present

## 2022-03-22 NOTE — Telephone Encounter (Signed)
FYI

## 2022-06-09 ENCOUNTER — Other Ambulatory Visit: Payer: Self-pay | Admitting: Cardiology

## 2022-06-09 ENCOUNTER — Other Ambulatory Visit: Payer: Self-pay | Admitting: Adult Health

## 2022-06-14 ENCOUNTER — Other Ambulatory Visit: Payer: Self-pay | Admitting: Cardiology

## 2022-06-14 ENCOUNTER — Other Ambulatory Visit: Payer: Self-pay | Admitting: Adult Health

## 2022-06-14 DIAGNOSIS — I2510 Atherosclerotic heart disease of native coronary artery without angina pectoris: Secondary | ICD-10-CM

## 2022-06-14 DIAGNOSIS — E782 Mixed hyperlipidemia: Secondary | ICD-10-CM

## 2022-07-17 ENCOUNTER — Ambulatory Visit (INDEPENDENT_AMBULATORY_CARE_PROVIDER_SITE_OTHER): Payer: Medicare PPO

## 2022-07-17 VITALS — Ht 64.0 in | Wt 150.0 lb

## 2022-07-17 DIAGNOSIS — Z0000 Encounter for general adult medical examination without abnormal findings: Secondary | ICD-10-CM

## 2022-07-17 NOTE — Patient Instructions (Signed)
Monique Miller , Thank you for taking time to come for your Medicare Wellness Visit. I appreciate your ongoing commitment to your health goals. Please review the following plan we discussed and let me know if I can assist you in the future.   Screening recommendations/referrals: Colonoscopy: completed 09/24/2013, due 09/25/2023 Mammogram: patient to schedule Bone Density: completed 03/22/2022 Recommended yearly ophthalmology/optometry visit for glaucoma screening and checkup Recommended yearly dental visit for hygiene and checkup  Vaccinations: Influenza vaccine: due Pneumococcal vaccine: completed 10/18/2021 Tdap vaccine: completed 01/01/2019, due 01/01/2029 Shingles vaccine: completed   Covid-19: 08/30/2021, 02/10/2021, 12/23/2019, 12/02/2019  Advanced directives: Please bring a copy of your POA (Power of Attorney) and/or Living Will to your next appointment.   Conditions/risks identified: none  Next appointment: Follow up in one year for your annual wellness visit    Preventive Care 65 Years and Older, Female Preventive care refers to lifestyle choices and visits with your health care provider that can promote health and wellness. What does preventive care include? A yearly physical exam. This is also called an annual well check. Dental exams once or twice a year. Routine eye exams. Ask your health care provider how often you should have your eyes checked. Personal lifestyle choices, including: Daily care of your teeth and gums. Regular physical activity. Eating a healthy diet. Avoiding tobacco and drug use. Limiting alcohol use. Practicing safe sex. Taking low-dose aspirin every day. Taking vitamin and mineral supplements as recommended by your health care provider. What happens during an annual well check? The services and screenings done by your health care provider during your annual well check will depend on your age, overall health, lifestyle risk factors, and family history of  disease. Counseling  Your health care provider may ask you questions about your: Alcohol use. Tobacco use. Drug use. Emotional well-being. Home and relationship well-being. Sexual activity. Eating habits. History of falls. Memory and ability to understand (cognition). Work and work Statistician. Reproductive health. Screening  You may have the following tests or measurements: Height, weight, and BMI. Blood pressure. Lipid and cholesterol levels. These may be checked every 5 years, or more frequently if you are over 71 years old. Skin check. Lung cancer screening. You may have this screening every year starting at age 82 if you have a 30-pack-year history of smoking and currently smoke or have quit within the past 15 years. Fecal occult blood test (FOBT) of the stool. You may have this test every year starting at age 38. Flexible sigmoidoscopy or colonoscopy. You may have a sigmoidoscopy every 5 years or a colonoscopy every 10 years starting at age 59. Hepatitis C blood test. Hepatitis B blood test. Sexually transmitted disease (STD) testing. Diabetes screening. This is done by checking your blood sugar (glucose) after you have not eaten for a while (fasting). You may have this done every 1-3 years. Bone density scan. This is done to screen for osteoporosis. You may have this done starting at age 39. Mammogram. This may be done every 1-2 years. Talk to your health care provider about how often you should have regular mammograms. Talk with your health care provider about your test results, treatment options, and if necessary, the need for more tests. Vaccines  Your health care provider may recommend certain vaccines, such as: Influenza vaccine. This is recommended every year. Tetanus, diphtheria, and acellular pertussis (Tdap, Td) vaccine. You may need a Td booster every 10 years. Zoster vaccine. You may need this after age 64. Pneumococcal 13-valent conjugate (PCV13)  vaccine. One  dose is recommended after age 34. Pneumococcal polysaccharide (PPSV23) vaccine. One dose is recommended after age 43. Talk to your health care provider about which screenings and vaccines you need and how often you need them. This information is not intended to replace advice given to you by your health care provider. Make sure you discuss any questions you have with your health care provider. Document Released: 11/25/2015 Document Revised: 07/18/2016 Document Reviewed: 08/30/2015 Elsevier Interactive Patient Education  2017 [place] Prevention in the Home Falls can cause injuries. They can happen to people of all ages. There are many things you can do to make your home safe and to help prevent falls. What can I do on the outside of my home? Regularly fix the edges of walkways and driveways and fix any cracks. Remove anything that might make you trip as you walk through a door, such as a raised step or threshold. Trim any bushes or trees on the path to your home. Use bright outdoor lighting. Clear any walking paths of anything that might make someone trip, such as rocks or tools. Regularly check to see if handrails are loose or broken. Make sure that both sides of any steps have handrails. Any raised decks and porches should have guardrails on the edges. Have any leaves, snow, or ice cleared regularly. Use sand or salt on walking paths during winter. Clean up any spills in your garage right away. This includes oil or grease spills. What can I do in the bathroom? Use night lights. Install grab bars by the toilet and in the tub and shower. Do not use towel bars as grab bars. Use non-skid mats or decals in the tub or shower. If you need to sit down in the shower, use a plastic, non-slip stool. Keep the floor dry. Clean up any water that spills on the floor as soon as it happens. Remove soap buildup in the tub or shower regularly. Attach bath mats securely with double-sided  non-slip rug tape. Do not have throw rugs and other things on the floor that can make you trip. What can I do in the bedroom? Use night lights. Make sure that you have a light by your bed that is easy to reach. Do not use any sheets or blankets that are too big for your bed. They should not hang down onto the floor. Have a firm chair that has side arms. You can use this for support while you get dressed. Do not have throw rugs and other things on the floor that can make you trip. What can I do in the kitchen? Clean up any spills right away. Avoid walking on wet floors. Keep items that you use a lot in easy-to-reach places. If you need to reach something above you, use a strong step stool that has a grab bar. Keep electrical cords out of the way. Do not use floor polish or wax that makes floors slippery. If you must use wax, use non-skid floor wax. Do not have throw rugs and other things on the floor that can make you trip. What can I do with my stairs? Do not leave any items on the stairs. Make sure that there are handrails on both sides of the stairs and use them. Fix handrails that are broken or loose. Make sure that handrails are as long as the stairways. Check any carpeting to make sure that it is firmly attached to the stairs. Fix any carpet that is loose  or worn. Avoid having throw rugs at the top or bottom of the stairs. If you do have throw rugs, attach them to the floor with carpet tape. Make sure that you have a light switch at the top of the stairs and the bottom of the stairs. If you do not have them, ask someone to add them for you. What else can I do to help prevent falls? Wear shoes that: Do not have high heels. Have rubber bottoms. Are comfortable and fit you well. Are closed at the toe. Do not wear sandals. If you use a stepladder: Make sure that it is fully opened. Do not climb a closed stepladder. Make sure that both sides of the stepladder are locked into place. Ask  someone to hold it for you, if possible. Clearly mark and make sure that you can see: Any grab bars or handrails. First and last steps. Where the edge of each step is. Use tools that help you move around (mobility aids) if they are needed. These include: Canes. Walkers. Scooters. Crutches. Turn on the lights when you go into a dark area. Replace any light bulbs as soon as they burn out. Set up your furniture so you have a clear path. Avoid moving your furniture around. If any of your floors are uneven, fix them. If there are any pets around you, be aware of where they are. Review your medicines with your doctor. Some medicines can make you feel dizzy. This can increase your chance of falling. Ask your doctor what other things that you can do to help prevent falls. This information is not intended to replace advice given to you by your health care provider. Make sure you discuss any questions you have with your health care provider. Document Released: 08/25/2009 Document Revised: 04/05/2016 Document Reviewed: 12/03/2014 Elsevier Interactive Patient Education  2017 [employer].

## 2022-07-17 NOTE — Progress Notes (Signed)
I connected with Monique Miller today by telephone and verified that I am speaking with the correct person using two identifiers. Location patient: home Location provider: work Persons participating in the virtual visit: Laelynn, Blizzard LPN.   I discussed the limitations, risks, security and privacy concerns of performing an evaluation and management service by telephone and the availability of in person appointments. I also discussed with the patient that there may be a patient responsible charge related to this service. The patient expressed understanding and verbally consented to this telephonic visit.    Interactive audio and video telecommunications were attempted between this provider and patient, however failed, due to patient having technical difficulties OR patient did not have access to video capability.  We continued and completed visit with audio only.     Vital signs may be patient reported or missing.  Subjective:   Monique Miller is a 71 y.o. female who presents for Medicare Annual (Subsequent) preventive examination.  Review of Systems     Cardiac Risk Factors include: advanced age (>80mn, >>73women)     Objective:    Today's Vitals   07/17/22 1556  Weight: 150 lb (68 kg)  Height: '5\' 4"'$  (1.626 m)   Body mass index is 25.75 kg/m.     07/17/2022    4:02 PM 07/12/2021   11:25 AM 08/08/2020    8:22 AM 04/01/2017    4:21 PM 03/08/2017    3:00 AM 03/07/2017   11:32 AM 02/27/2017   10:39 AM  Advanced Directives  Does Patient Have a Medical Advance Directive? Yes Yes Yes No;Yes Yes Yes Yes  Type of AParamedicof ALomas Verdes ComunidadLiving will HPortage LakesLiving will HHenryLiving will HLake DarbyLiving will HPecan HillLiving will HRowlandLiving will HBowersvilleLiving will  Does patient want to make changes to medical advance directive?    No - Patient declined  No - Patient declined  No - Patient declined  Copy of HMayflower Villagein Chart? No - copy requested No - copy requested Yes - validated most recent copy scanned in chart (See row information) No - copy requested No - copy requested Yes No - copy requested    Current Medications (verified) Outpatient Encounter Medications as of 07/17/2022  Medication Sig   aspirin 81 MG tablet Take 81 mg by mouth daily.    calcium-vitamin D (OSCAL WITH D) 250-125 MG-UNIT tablet Take 4 tablets by mouth daily. Pt is taking 2 tabs in the am and 2 tabs in the pm   celecoxib (CELEBREX) 200 MG capsule TAKE 1 CAPSULE BY MOUTH EVERY DAY   FLUoxetine (PROZAC) 40 MG capsule TAKE 2 CAPSULES (80 MG TOTAL) BY MOUTH EVERY EVENING.   Melatonin 10 MG TABS Take 10 mg by mouth at bedtime.   metFORMIN (GLUCOPHAGE) 500 MG tablet TAKE 1/2 TABLET BY MOUTH DAILY WITH BREAKFAST.   metoprolol succinate (TOPROL-XL) 25 MG 24 hr tablet TAKE 1 TABLET BY MOUTH DAILY. TAKE WITH OR IMMEDIATELY FOLLOWING A MEAL.   niacin (NIASPAN) 1000 MG CR tablet TAKE 1 TABLET BY MOUTH TWICE A DAY (Patient taking differently: Pt is taking 1 tab daily)   Probiotic Product (PROBIOTIC DAILY PO) Take 1 capsule by mouth daily.   ramipril (ALTACE) 5 MG capsule TAKE 1 CAPSULE BY MOUTH EVERY DAY (Patient taking differently: Take by mouth daily. Every other day)   rosuvastatin (CRESTOR) 20 MG tablet TAKE 1 TABLET  BY MOUTH EVERY DAY   ramipril (ALTACE) 2.5 MG capsule Take 1 capsule (2.5 mg total) by mouth daily.   No facility-administered encounter medications on file as of 07/17/2022.    Allergies (verified) Patient has no known allergies.   History: Past Medical History:  Diagnosis Date   Allergy    Anemia    "many years ago"   Anxiety    Arthritis    Cancer ([place])    skin cancer (basal cell)   Constipation by delayed colonic transit    Coronary artery disease    Depression    see Meridith Child psychotherapist and Trey Paula    Dyspnea    occ   Grover's disease    Headache    Hx of migraines    NSTEMI (non-ST elevated myocardial infarction) ([place]) 02/2016   Pneumonia    walking pneumonia   Restless leg syndrome    Thyroid disease    hx of hypothyroid   Past Surgical History:  Procedure Laterality Date   APPENDECTOMY     AUGMENTATION MAMMAPLASTY Bilateral    Patient doesn't remember date   BACK SURGERY     L4-L5   BREAST SURGERY     augmentation   CARDIAC CATHETERIZATION N/A 03/08/2016   Procedure: Left Heart Cath and Coronary Angiography;  Surgeon: Adrian Prows, MD;  Location: [facility];  Service: Cardiovascular;  Laterality: N/A;   CARDIAC CATHETERIZATION N/A 03/08/2016   Procedure: Coronary Stent Intervention;  Surgeon: Adrian Prows, MD;  Location: [facility];  Service: Cardiovascular;  Laterality: N/A;  [place] 3.5x15   CARDIAC CATHETERIZATION N/A 03/08/2016   Procedure: Intravascular Ultrasound/IVUS;  Surgeon: Adrian Prows, MD;  Location: [facility];  Service: Cardiovascular;  Laterality: N/A;  RCA MID   CESAREAN SECTION     x2   COLONOSCOPY     CORONARY ANGIOGRAM  03/08/2016   CORONARY ANGIOPLASTY     DES LAD, DES D1 02/16/16 Bartolo Darter, [place]), DES RCA 03/08/16 [facility])   LAMINECTOMY WITH POSTERIOR LATERAL ARTHRODESIS LEVEL 2 N/A 03/07/2017   Procedure: Lumbar Four-Five, Lumbar Five-Sacral One Posterior Lateral Fusion with Removal of pedicle screws;  Surgeon: Eustace Moore, MD;  Location: [facility];  Service: Neurosurgery;  Laterality: N/A;   Family History  Problem Relation Age of Onset   Cancer Mother        pancreatic   Congestive Heart Failure Father    Heart attack Brother        Had blockage, almost a HA   Heart disease Brother    Cancer Maternal Uncle        pancreatic   Cancer Maternal Grandmother        pancreatic   Breast cancer Neg Hx    Social History   Socioeconomic History   Marital status: Married    Spouse name: Not on file   Number of children: 2   Years of  education: Not on file   Highest education level: Master's degree (e.g., MA, MS, MEng, MEd, MSW, MBA)  Occupational History   Not on file  Tobacco Use   Smoking status: Never   Smokeless tobacco: Never  Vaping Use   Vaping Use: Never used  Substance and Sexual Activity   Alcohol use: Yes    Alcohol/week: 7.0 standard drinks of alcohol    Types: 7 Glasses of wine per week    Comment: occassionally   Drug use: No   Sexual activity: Not on file  Other Topics Concern  Not on file  Social History Narrative   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    Social Determinants of Health   Financial Resource Strain: Low Risk  (07/17/2022)   Overall Financial Resource Strain (CARDIA)    Difficulty of Paying Living Expenses: Not hard at all  Food Insecurity: No Food Insecurity (07/17/2022)   Hunger Vital Sign    Worried About Running Out of Food in the Last Year: Never true    Ran Out of Food in the Last Year: Never true  Transportation Needs: No Transportation Needs (07/17/2022)   PRAPARE - Hydrologist (Medical): No    Lack of Transportation (Non-Medical): No  Physical Activity: Inactive (07/17/2022)   Exercise Vital Sign    Days of Exercise per Week: 0 days    Minutes of Exercise per Session: 0 min  Stress: No Stress Concern Present (07/17/2022)   [place]    Feeling of Stress : Not at all  Social Connections: Moderately Integrated (10/14/2021)   Social Connection and Isolation Panel [NHANES]    Frequency of Communication with Friends and Family: More than three times a week    Frequency of Social Gatherings with Friends and Family: Three times a week    Attends Religious Services: 1 to 4 times per year    Active Member  of Clubs or Organizations: No    Attends Music therapist: Not on file    Marital Status: Married    Tobacco Counseling Counseling given: Not Answered   Clinical Intake:  Pre-visit preparation completed: Yes  Pain : No/denies pain     Nutritional Status: BMI 25 -29 Overweight Nutritional Risks: None Diabetes: No  How often do you need to have someone help you when you read instructions, pamphlets, or other written materials from your doctor or pharmacy?: 1 - Never What is the last grade level you completed in school?: masters degree  Diabetic? no  Interpreter Needed?: No  Information entered by :: NAllen LPN   Activities of Daily Living    07/17/2022    4:04 PM 07/09/2022   10:57 AM  In your present state of health, do you have any difficulty performing the following activities:  Hearing? '1 1   1  '$ Comment getting hearing checked soon   Vision? 0 0   0  Difficulty concentrating or making decisions? 0 0   0  Walking or climbing stairs? '1 1   1  '$ Dressing or bathing? 0 0   0  Doing errands, shopping? 0 0   0  Preparing Food and eating ? N N   N  Using the Toilet? N N   N  In the past six months, have you accidently leaked urine? Y Y   Y  Do you have problems with loss of bowel control? N N   N  Managing your Medications? N N   N  Managing your Finances? N N   N  Housekeeping or managing your Housekeeping? Carloyn Manner    Patient Care Team: [name], NP as PCP - General (Family Medicine) Adrian Prows, MD as Consulting Physician (Cardiology)  Almedia Balls, MD as Referring Physician (Orthopedic Surgery)  Indicate any recent Medical Services you may have received from other than [facility] providers in the past year (date may be approximate).     Assessment:   This is a routine wellness examination for Alexius.  Hearing/Vision screen Vision Screening - Comments:: Regular eye exams, [facility]  Dietary issues and exercise activities  discussed: Current Exercise Habits: The patient does not participate in regular exercise at present   Goals Addressed             This Visit's Progress    Patient Stated       07/17/2022, no goals       Depression Screen    07/17/2022    4:03 PM 10/18/2021   10:07 AM 07/12/2021   11:26 AM 07/12/2021   11:23 AM 09/27/2020    7:31 AM 08/08/2020    8:26 AM  PHQ 2/9 Scores  PHQ - 2 Score 0 5 0 0 1 0  PHQ- 9 Score  13   3 0    Fall Risk    07/17/2022    4:03 PM 07/09/2022   10:57 AM 10/18/2021   10:07 AM 10/14/2021   10:44 AM 07/12/2021   11:26 AM  Fall Risk   Falls in the past year? '1 1   1 1 1 '$ 0  Comment fell on steps      Number falls in past yr: '1 1   1 '$ 0 0 0  Injury with Fall? 0 '1   1 1 1 '$ 0  Risk for fall due to : History of fall(s);Medication side effect    No Fall Risks  Follow up Falls evaluation completed;Education provided;Falls prevention discussed    Falls evaluation completed    FALL RISK PREVENTION PERTAINING TO THE HOME:  Any stairs in or around the home? Yes  If so, are there any without handrails? No  Home free of loose throw rugs in walkways, pet beds, electrical cords, etc? Yes  Adequate lighting in your home to reduce risk of falls? Yes   ASSISTIVE DEVICES UTILIZED TO PREVENT FALLS:  Life alert? No  Use of a cane, walker or w/c? No  Grab bars in the bathroom? Yes  Shower chair or bench in shower? Yes  Elevated toilet seat or a handicapped toilet? No   TIMED UP AND GO:  Was the test performed? No .      Cognitive Function:        07/17/2022    4:05 PM  6CIT Screen  What Year? 0 points  What month? 0 points  What time? 0 points  Count back from 20 0 points  Months in reverse 0 points  Repeat phrase 0 points  Total Score 0 points    Immunizations Immunization History  Administered Date(s) Administered   Fluad Quad(high Dose 65+) 08/30/2021   Influenza, Quadrivalent, Recombinant, Inj, Pf 08/27/2019   Influenza,inj,Quad PF,6+ Mos  08/29/2017, 08/26/2018   Influenza-Unspecified 07/13/2020   PFIZER Comirnaty(Gray Top)Covid-19 Tri-Sucrose Vaccine 08/30/2021   PFIZER(Purple Top)SARS-COV-2 Vaccination 12/02/2019, 12/23/2019, 02/10/2021   Pfizer Covid-19 Vaccine Bivalent Booster 49yr & up 08/30/2021   Pneumococcal Conjugate-13 10/18/2021   Pneumococcal Polysaccharide-23 08/26/2018, 08/26/2018   Tdap 01/01/2019   Zoster Recombinat (Shingrix) 09/26/2018, 12/25/2018, 12/25/2018    TDAP status: Up to date  Flu Vaccine status: Due, Education has been provided regarding the importance of this vaccine. Advised may receive this vaccine at local pharmacy or Health Dept. Aware to provide  a copy of the vaccination record if obtained from local pharmacy or Health Dept. Verbalized acceptance and understanding.  Pneumococcal vaccine status: Up to date  Covid-19 vaccine status: Completed vaccines  Qualifies for Shingles Vaccine? Yes   Zostavax completed Yes   Shingrix Completed?: Yes  Screening Tests Health Maintenance  Topic Date Due   COVID-19 Vaccine (5 - Pfizer risk series) 10/25/2021   MAMMOGRAM  05/05/2022   INFLUENZA VACCINE  06/12/2022   Pneumonia Vaccine 21+ Years old (3 - PPSV23 or PCV20) 08/27/2023   COLONOSCOPY (Pts 45-61yr Insurance coverage will need to be confirmed)  09/25/2023   TETANUS/TDAP  01/01/2029   DEXA SCAN  Completed   Hepatitis C Screening  Completed   Zoster Vaccines- Shingrix  Completed   HPV VACCINES  Aged Out    Health Maintenance  Health Maintenance Due  Topic Date Due   COVID-19 Vaccine (5 - Pfizer risk series) 10/25/2021   MAMMOGRAM  05/05/2022   INFLUENZA VACCINE  06/12/2022    Colorectal cancer screening: Type of screening: Colonoscopy. Completed 09/24/2013. Repeat every 10 years  Mammogram status: patient to schedule  Bone Density status: Completed 03/22/2022.   Lung Cancer Screening: (Low Dose CT Chest recommended if Age 69-80years, 30 pack-year currently smoking OR have  quit w/in 15years.) does not qualify.   Lung Cancer Screening Referral: no  Additional Screening:  Hepatitis C Screening: does qualify; Completed 09/18/2018  Vision Screening: Recommended annual ophthalmology exams for early detection of glaucoma and other disorders of the eye. Is the patient up to date with their annual eye exam?  Yes  Who is the provider or what is the name of the office in which the patient attends annual eye exams? [facility]  If pt is not established with a provider, would they like to be referred to a provider to establish care? No .   Dental Screening: Recommended annual dental exams for proper oral hygiene  Community Resource Referral / Chronic Care Management: CRR required this visit?  No   CCM required this visit?  No      Plan:     I have personally reviewed and noted the following in the patient's chart:   Medical and social history Use of alcohol, tobacco or illicit drugs  Current medications and supplements including opioid prescriptions. Patient is not currently taking opioid prescriptions. Functional ability and status Nutritional status Physical activity Advanced directives List of other physicians Hospitalizations, surgeries, and ER visits in previous 12 months Vitals Screenings to include cognitive, depression, and falls Referrals and appointments  In addition, I have reviewed and discussed with patient certain preventive protocols, quality metrics, and best practice recommendations. A written personalized care plan for preventive services as well as general preventive health recommendations were provided to patient.     NKellie Simmering LPN   [id number]  Nurse Notes: none  Due to this being a virtual visit, the after visit summary with patients personalized plan was offered to patient via mail or my-chart. Patient would like to access on my-chart

## 2022-07-18 ENCOUNTER — Other Ambulatory Visit: Payer: Self-pay | Admitting: Adult Health

## 2022-07-18 DIAGNOSIS — Z1231 Encounter for screening mammogram for malignant neoplasm of breast: Secondary | ICD-10-CM

## 2022-07-23 ENCOUNTER — Ambulatory Visit
Admission: RE | Admit: 2022-07-23 | Discharge: 2022-07-23 | Disposition: A | Discharge status: Home / Self Care | Payer: Medicare PPO | Source: Ambulatory Visit | Attending: Adult Health | Admitting: Adult Health

## 2022-07-23 ENCOUNTER — Other Ambulatory Visit: Payer: Self-pay | Admitting: Adult Health

## 2022-07-23 DIAGNOSIS — Z1231 Encounter for screening mammogram for malignant neoplasm of breast: Secondary | ICD-10-CM

## 2022-07-30 DIAGNOSIS — H903 Sensorineural hearing loss, bilateral: Secondary | ICD-10-CM | POA: Diagnosis not present

## 2022-08-15 DIAGNOSIS — M4316 Spondylolisthesis, lumbar region: Secondary | ICD-10-CM | POA: Diagnosis not present

## 2022-08-15 DIAGNOSIS — S32009K Unspecified fracture of unspecified lumbar vertebra, subsequent encounter for fracture with nonunion: Secondary | ICD-10-CM | POA: Diagnosis not present

## 2022-08-16 ENCOUNTER — Other Ambulatory Visit: Payer: Self-pay | Admitting: Neurosurgery

## 2022-08-16 DIAGNOSIS — M4316 Spondylolisthesis, lumbar region: Secondary | ICD-10-CM

## 2022-08-22 ENCOUNTER — Ambulatory Visit
Admission: RE | Admit: 2022-08-22 | Discharge: 2022-08-22 | Disposition: A | Discharge status: Home / Self Care | Payer: Medicare PPO | Source: Ambulatory Visit | Attending: Neurosurgery | Admitting: Neurosurgery

## 2022-08-22 DIAGNOSIS — M48061 Spinal stenosis, lumbar region without neurogenic claudication: Secondary | ICD-10-CM | POA: Diagnosis not present

## 2022-08-22 DIAGNOSIS — M4316 Spondylolisthesis, lumbar region: Secondary | ICD-10-CM | POA: Diagnosis not present

## 2022-08-22 DIAGNOSIS — M5127 Other intervertebral disc displacement, lumbosacral region: Secondary | ICD-10-CM | POA: Diagnosis not present

## 2022-08-22 MED ORDER — GADOPICLENOL 0.5 MMOL/ML IV SOLN
7.0000 mL | Freq: Once | INTRAVENOUS | Status: AC | PRN
  Administered 2022-08-22: 7 mL via INTRAVENOUS

## 2022-08-27 DIAGNOSIS — H903 Sensorineural hearing loss, bilateral: Secondary | ICD-10-CM | POA: Diagnosis not present

## 2022-08-28 DIAGNOSIS — M5136 Other intervertebral disc degeneration, lumbar region: Secondary | ICD-10-CM | POA: Diagnosis not present

## 2022-08-28 DIAGNOSIS — Z6825 Body mass index (BMI) 25.0-25.9, adult: Secondary | ICD-10-CM | POA: Diagnosis not present

## 2022-08-31 ENCOUNTER — Encounter: Payer: Self-pay | Admitting: Cardiology

## 2022-08-31 NOTE — Telephone Encounter (Signed)
From patient.

## 2022-09-03 NOTE — Therapy (Signed)
OUTPATIENT PHYSICAL THERAPY THORACOLUMBAR EVALUATION   Patient Name: Monique Miller MRN: [id number] DOB:10-Feb-1951, 71 y.o., female Today's Date: 09/05/2022   PT End of Session - 09/05/22 1531     Visit Number 1    Date for PT Re-Evaluation 10/31/22    Authorization Type [employer]    Progress Note Due on Visit 10    PT Start Time 1535    Activity Tolerance Patient tolerated treatment well    Behavior During Therapy [facility] for tasks assessed/performed             Past Medical History:  Diagnosis Date   Allergy    Anemia    "many years ago"   Anxiety    Arthritis    Cancer ([place])    skin cancer (basal cell)   Constipation by delayed colonic transit    Coronary artery disease    Depression    see Meridith Child psychotherapist and Trey Paula   Dyspnea    occ   Grover's disease    Headache    Hx of migraines    NSTEMI (non-ST elevated myocardial infarction) ([place]) 02/2016   Pneumonia    walking pneumonia   Restless leg syndrome    Thyroid disease    hx of hypothyroid   Past Surgical History:  Procedure Laterality Date   APPENDECTOMY     AUGMENTATION MAMMAPLASTY Bilateral    Patient doesn't remember date   BACK SURGERY     L4-L5   BREAST SURGERY     augmentation   CARDIAC CATHETERIZATION N/A 03/08/2016   Procedure: Left Heart Cath and Coronary Angiography;  Surgeon: Adrian Prows, MD;  Location: [place] [facility];  Service: Cardiovascular;  Laterality: N/A;   CARDIAC CATHETERIZATION N/A 03/08/2016   Procedure: Coronary Stent Intervention;  Surgeon: Adrian Prows, MD;  Location: [place] [facility];  Service: Cardiovascular;  Laterality: N/A;  Resolute 3.5x15   CARDIAC CATHETERIZATION N/A 03/08/2016   Procedure: Intravascular Ultrasound/IVUS;  Surgeon: Adrian Prows, MD;  Location: [place] [facility];  Service: Cardiovascular;  Laterality: N/A;  RCA MID   CESAREAN SECTION     x2   COLONOSCOPY     CORONARY ANGIOGRAM  03/08/2016   CORONARY ANGIOPLASTY     DES LAD, DES D1 02/16/16  Bartolo Darter, ), DES RCA 03/08/16 [facility])   LAMINECTOMY WITH POSTERIOR LATERAL ARTHRODESIS LEVEL 2 N/A 03/07/2017   Procedure: Lumbar Four-Five, Lumbar Five-Sacral One Posterior Lateral Fusion with Removal of pedicle screws;  Surgeon: Eustace Moore, MD;  Location: [place];  Service: Neurosurgery;  Laterality: N/A;   Patient Active Problem List   Diagnosis Date Noted   S/P lumbar spinal fusion 03/07/2017   Osteopenia 03/14/2016   Post PTCA 03/08/2016   CAD (coronary artery disease), native coronary artery 03/05/2016   Spinal stenosis of lumbar region at multiple levels 08/06/2014   Anxiety 10/02/2012   Depression 10/02/2012   Menopausal disorder 10/02/2012   Attention deficit disorder 10/02/2012    PCP: [name], NP  REFERRING PROVIDER: Eustace Moore, MD  REFERRING DIAG: M51.36 (ICD-10-CM) - Other intervertebral disc degeneration, lumbar region  Rationale for Evaluation and Treatment Rehabilitation  THERAPY DIAG:  Other low back pain  Muscle weakness (generalized)  Other abnormalities of gait and mobility  ONSET DATE: Chronic, worsening exacerbation over past few months  SUBJECTIVE:  SUBJECTIVE STATEMENT: Longstanding history of low back pain with previous surgeries. Pt states has been worsening recently and has had to reduce activity levels. Pt states that she is typically quite active and enjoys walking, would like to get back to it. Has two young grandchildren she enjoys spending time with. Pt has difficulty cooking, cleaning, (difficulty w standing activities >27mn), yardwork. Occasional shooting pains into LE, occurs in both LE but only one at a time, goes along lateral thigh to knee. Pt report some difficulty having bowel movements when pain is worse, denies difficulty controlling bowels.  Reports some longstanding issues with bladder that she does not feel is related to back. States bowel/bladder symptoms have been stable, had MRI. Denies saddle anesthesia. Denies fevers or night sweats.   PERTINENT HISTORY:  CAD, osteopenia, anxiety/depression, s/p lumbar spinal fusion (hardware removal 2018)  PAIN:  Are you having pain: yes, 0/10 Location: central low back pain, occasional B LE pain through lateral thighs (pt states only occurs in 1 at a time) How would you describe your pain? shooting Best in past week: 0/10 Worst in past week: 6-7/10 Aggravating factors: increased activity, bending, lifting, standing >154m, yardwork, housework (particularly vacuuming and sweeping) Easing factors: rest, activity modification, medication   PRECAUTIONS: osteopenia s/p lumbar fusion  WEIGHT BEARING RESTRICTIONS: No  FALLS:  Has patient fallen in last 6 months? 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 Progressive adjacent segment disease at L3-4 with degenerative edema, mild spinal stenosis, and moderate left neural foraminal stenosis. 2. Mild lateral recess and neural foraminal stenosis at L2-3, stable to mildly progressed.    PATIENT SURVEYS:  FOTO: 34%, predicted 47%  SCREENING FOR RED FLAGS: Red flag questioning unremarkable, MRI as above  COGNITION: Overall cognitive status: Within functional limits for tasks assessed     SENSATION: Light touch intact B LE  POSTURE: grossly intact  PALPATION: Deferred on this date given time constraints  LUMBAR ROM:   AROM eval  Flexion Mid shin, p!   Extension 10%  Right lateral flexion   Left lateral flexion   Right rotation 50%  Left rotation 50%   (Blank rows = not tested) Comments: stiffness w/ rotation, no pain  LOWER EXTREMITY MMT:    MMT Right eval Left eval  Hip flexion 4+ 5  Hip abduction (modified sitting) 5 5  Hip internal rotation 3+ mild pain 4  Hip external rotation 4+ 4  Knee flexion 4 4  Knee extension 4 4   (Blank rows = not tested)  Comments: noted compensations at trunk with hip rotational testing   FUNCTIONAL TESTS:  5xSTS: 14 sec standard chair no UE support, increased low back pain that eases with rest  GAIT: Distance walked: within clinic Assistive device utilized: None Level of assistance: Complete Independence Comments: reduced truncal ROM and arm swing B, partial step through pattern, reduced step length, reduced hip extension B  TODAY'S TREATMENT: [facility] Adult PT Treatment:                   DATE: 09/05/22 Therapeutic Exercise: Posterior pelvic tilt x10, education on setup for  HEP, pacing  PATIENT EDUCATION:  Education details: Pt education on PT impairments, prognosis, and POC. Informed consent. Rationale for interventions, safe/appropriate HEP performance Person educated: Patient Education method: Explanation, Demonstration, Tactile cues, Verbal cues, and Handouts Education comprehension: verbalized understanding, returned demonstration, verbal cues required, tactile cues required, and needs further education   HOME EXERCISE PROGRAM: Education on posterior pelvic tilts 3x10 daily, education on safe performance, pain free ROM Education on beginner walking program, activity modification, safety w/ activity  ASSESSMENT:  CLINICAL IMPRESSION: Pt is a pleasant 71 year old woman who arrives to PT evaluation on this date for back pain. Spouse present for initial evaluation per pt request. Pt reports difficulty with daily tasks such as cooking, cleaning, and other housework due to pain.  Reports reduction in overall activity levels and increased pain while performing quality of life activities such as playing with grandchildren. During today's session pt demonstrates significant limitations in lumbar mobility and hip strength which are limiting ability to perform aforementioned activities. 5xSTS performed in 14 sec which is at or above cutoff score for fall risk in majority of populations (12-14 sec, varying). Pt tolerates HEP with no increase in pain, cues for home performance. Significant time spent with pt education as above, pt and spouse both engaged and inquisitive. Recommend skilled PT to address aforementioned deficits to improve functional independence/tolerance. Pt departs today's session in no acute distress, all voiced questions/concerns addressed appropriately from PT perspective.    OBJECTIVE IMPAIRMENTS: Abnormal gait, decreased activity tolerance, decreased balance, decreased endurance, decreased mobility, difficulty walking, decreased ROM, decreased strength, and pain.   ACTIVITY LIMITATIONS: carrying, lifting, bending, standing, squatting, transfers, and locomotion level  PARTICIPATION LIMITATIONS: meal prep, cleaning, laundry, shopping, community activity, and yard work  PERSONAL FACTORS: Age, Time since onset of injury/illness/exacerbation, and 3+ comorbidities: (cardiac, anxiety/depression, osteopenia, previous MSK history)  are also affecting patient's functional outcome.   REHAB POTENTIAL: Good  CLINICAL DECISION MAKING: Evolving/moderate complexity  EVALUATION COMPLEXITY: Moderate   GOALS: Goals reviewed with patient? No due to time constraints  SHORT TERM GOALS: Target date: 10/03/2022  Pt will demonstrate appropriate understanding and performance of initially prescribed HEP in order to facilitate improved independence with management of symptoms.  Baseline: HEP provided on eval Goal status: INITIAL   2. Pt will score greater than or equal to 42% on  FOTO in order to demonstrate improved perception of function due to symptoms.  Baseline: 34%  Goal status: INITIAL   LONG TERM GOALS: Target date: 10/31/2022  Pt will score >47% on FOTO in order to demonstrate improved perception of functional status due to symptoms.  Baseline: 34% Goal status: INITIAL  2.  Pt will demonstrate >25% lumbar extension AROM in order to demonstrate improved tolerance to functional movement patterns.  Baseline: see ROM chart above Goal status: INITIAL  3.  Pt will demonstrate symmetrical hip MMT in order to demonstrate improved strength for functional movements.  Baseline: see MMT chart above Goal status: INITIAL  4. Pt will perform 5xSTS in <10 sec in order to demonstrate reduced fall risk and improved functional independence. (MCID of 2.3sec)  Baseline: 14 standard chair no UE support  Goal status: INITIAL   5. Pt will report ability to perform typical household activity such as vacuuming/sweeping with less than 2 point increase in low back pain pain on NPS in order to demonstrate improved functional tolerance.   Baseline: up to 6-7/10 pain on NPS with vacuuming/broom  Goal status: INITIAL    PLAN:  PT FREQUENCY: 1-2x/week  PT DURATION: 8 weeks  PLANNED INTERVENTIONS: Therapeutic exercises, Therapeutic activity, Neuromuscular re-education, Balance training, Gait training, Patient/Family education, Self Care, Joint mobilization, Stair training, Aquatic Therapy, Dry Needling, Spinal mobilization, Cryotherapy, Moist heat, Taping, Manual therapy, and Re-evaluation.  PLAN FOR NEXT SESSION: Progress ROM/strengthening exercises as able/appropriate, review HEP.    Leeroy Cha PT, DPT 09/05/2022 4:41 PM

## 2022-09-05 ENCOUNTER — Other Ambulatory Visit: Payer: Self-pay

## 2022-09-05 ENCOUNTER — Ambulatory Visit: Payer: Medicare PPO | Attending: Neurological Surgery | Admitting: Physical Therapy

## 2022-09-05 ENCOUNTER — Encounter: Payer: Self-pay | Admitting: Physical Therapy

## 2022-09-05 DIAGNOSIS — M6281 Muscle weakness (generalized): Secondary | ICD-10-CM | POA: Insufficient documentation

## 2022-09-05 DIAGNOSIS — R2689 Other abnormalities of gait and mobility: Secondary | ICD-10-CM | POA: Insufficient documentation

## 2022-09-05 DIAGNOSIS — M5136 Other intervertebral disc degeneration, lumbar region: Secondary | ICD-10-CM | POA: Insufficient documentation

## 2022-09-05 DIAGNOSIS — M5459 Other low back pain: Secondary | ICD-10-CM | POA: Insufficient documentation

## 2022-09-07 ENCOUNTER — Other Ambulatory Visit: Payer: Self-pay | Admitting: Adult Health

## 2022-09-10 ENCOUNTER — Encounter: Payer: Self-pay | Admitting: Adult Health

## 2022-09-10 MED ORDER — METFORMIN HCL 500 MG PO TABS: ORAL_TABLET | ORAL | 0 refills | Status: DC

## 2022-09-11 ENCOUNTER — Ambulatory Visit: Payer: Medicare PPO | Admitting: Rehabilitative and Restorative Service Providers"

## 2022-09-11 ENCOUNTER — Encounter: Payer: Self-pay | Admitting: Rehabilitative and Restorative Service Providers"

## 2022-09-11 DIAGNOSIS — M6281 Muscle weakness (generalized): Secondary | ICD-10-CM | POA: Diagnosis not present

## 2022-09-11 DIAGNOSIS — R2689 Other abnormalities of gait and mobility: Secondary | ICD-10-CM | POA: Diagnosis not present

## 2022-09-11 DIAGNOSIS — M5459 Other low back pain: Secondary | ICD-10-CM | POA: Diagnosis not present

## 2022-09-11 DIAGNOSIS — M5136 Other intervertebral disc degeneration, lumbar region: Secondary | ICD-10-CM | POA: Diagnosis not present

## 2022-09-11 NOTE — Therapy (Signed)
OUTPATIENT PHYSICAL THERAPY THORACOLUMBAR TREATMENT   Patient Name: Monique Miller MRN: [id number] DOB:08-Mar-1951, 71 y.o., female Today's Date: 09/11/2022   PT End of Session - 09/11/22 0846     Visit Number 2    Number of Visits 17    Date for PT Re-Evaluation 10/31/22    Authorization Type [employer]    Progress Note Due on Visit 10    PT Start Time 0845    PT Stop Time 0934    PT Time Calculation (min) 49 min    Activity Tolerance Patient tolerated treatment well             Past Medical History:  Diagnosis Date   Allergy    Anemia    "many years ago"   Anxiety    Arthritis    Cancer ([place])    skin cancer (basal cell)   Constipation by delayed colonic transit    Coronary artery disease    Depression    see Meridith Child psychotherapist and Trey Paula   Dyspnea    occ   Grover's disease    Headache    Hx of migraines    NSTEMI (non-ST elevated myocardial infarction) ([place]) 02/2016   Pneumonia    walking pneumonia   Restless leg syndrome    Thyroid disease    hx of hypothyroid   Past Surgical History:  Procedure Laterality Date   APPENDECTOMY     AUGMENTATION MAMMAPLASTY Bilateral    Patient doesn't remember date   BACK SURGERY     L4-L5   BREAST SURGERY     augmentation   CARDIAC CATHETERIZATION N/A 03/08/2016   Procedure: Left Heart Cath and Coronary Angiography;  Surgeon: Adrian Prows, MD;  Location: [place] [facility];  Service: Cardiovascular;  Laterality: N/A;   CARDIAC CATHETERIZATION N/A 03/08/2016   Procedure: Coronary Stent Intervention;  Surgeon: Adrian Prows, MD;  Location: [place] [facility];  Service: Cardiovascular;  Laterality: N/A;  Resolute 3.5x15   CARDIAC CATHETERIZATION N/A 03/08/2016   Procedure: Intravascular Ultrasound/IVUS;  Surgeon: Adrian Prows, MD;  Location: [place] [facility];  Service: Cardiovascular;  Laterality: N/A;  RCA MID   CESAREAN SECTION     x2   COLONOSCOPY     CORONARY ANGIOGRAM  03/08/2016   CORONARY ANGIOPLASTY     DES LAD,  DES D1 02/16/16 Bartolo Darter, [place]), DES RCA 03/08/16 [facility])   LAMINECTOMY WITH POSTERIOR LATERAL ARTHRODESIS LEVEL 2 N/A 03/07/2017   Procedure: Lumbar Four-Five, Lumbar Five-Sacral One Posterior Lateral Fusion with Removal of pedicle screws;  Surgeon: Eustace Moore, MD;  Location: [place];  Service: Neurosurgery;  Laterality: N/A;   Patient Active Problem List   Diagnosis Date Noted   S/P lumbar spinal fusion 03/07/2017   Osteopenia 03/14/2016   Post PTCA 03/08/2016   CAD (coronary artery disease), native coronary artery 03/05/2016   Spinal stenosis of lumbar region at multiple levels 08/06/2014   Anxiety 10/02/2012   Depression 10/02/2012   Menopausal disorder 10/02/2012   Attention deficit disorder 10/02/2012    PCP: [name], NP  REFERRING PROVIDER: Eustace Moore, MD  REFERRING DIAG: M51.36 (ICD-10-CM) - Other intervertebral disc degeneration, lumbar region  Rationale for Evaluation and Treatment Rehabilitation  THERAPY DIAG:  Other low back pain  Muscle weakness (generalized)  Other abnormalities of gait and mobility  ONSET DATE: Chronic, worsening exacerbation over past few months  SUBJECTIVE:  SUBJECTIVE STATEMENT: 09/11/22: Patient has been doing exercise at home and it has been going okay. Had some pain this morning but is decreased now. Cleaned house yesterday and had significant increased pain after that.   Eval: Longstanding history of low back pain with previous surgeries. Pt states has been worsening recently and has had to reduce activity levels. Pt states that she is typically quite active and enjoys walking, would like to get back to it. Has two young grandchildren she enjoys spending time with. Pt has difficulty cooking, cleaning, (difficulty w standing activities >106mn),  yardwork. Occasional shooting pains into LE, occurs in both LE but only one at a time, goes along lateral thigh to knee. Pt report some difficulty having bowel movements when pain is worse, denies difficulty controlling bowels. Reports some longstanding issues with bladder that she does not feel is related to back. States bowel/bladder symptoms have been stable, had MRI. Denies saddle anesthesia. Denies fevers or night sweats.   PERTINENT HISTORY:  CAD, osteopenia, anxiety/depression, s/p lumbar spinal fusion (hardware removal 2018)  PAIN:  Are you having pain: yes, 4/10 Location: central low back pain, occasional B LE pain through lateral thighs (pt states only occurs in 1 at a time) How would you describe your pain? shooting Best in past week: 0/10 Worst in past week: 6-7/10 Aggravating factors: increased activity, bending, lifting, standing >176m, yardwork, housework (particularly vacuuming and sweeping) Easing factors: rest, activity modification, medication   PRECAUTIONS: osteopenia s/p lumbar fusion  WEIGHT BEARING RESTRICTIONS: No  FALLS:  Has patient fallen in last 6 months? 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 Progressive adjacent segment disease at L3-4 with degenerative edema, mild spinal stenosis, and moderate left neural foraminal stenosis. 2. Mild lateral recess and neural foraminal stenosis at L2-3, stable to mildly progressed.    PATIENT SURVEYS:  FOTO: 34%, predicted 47%  SCREENING FOR RED FLAGS: Red flag questioning unremarkable, MRI as above  COGNITION: Overall cognitive status:  Within functional limits for tasks assessed     SENSATION: Light touch intact B LE  POSTURE: grossly intact  PALPATION: Deferred on this date given time constraints  LUMBAR ROM:   AROM eval  Flexion Mid shin, p!  Extension 10%  Right lateral flexion   Left lateral flexion   Right rotation 50%  Left rotation 50%   (Blank rows = not tested) Comments: stiffness w/ rotation, no pain  LOWER EXTREMITY MMT:    MMT Right eval Left eval  Hip flexion 4+ 5  Hip abduction (modified sitting) 5 5  Hip internal rotation 3+ mild pain 4  Hip external rotation 4+ 4  Knee flexion 4 4  Knee extension 4 4   (Blank rows = not tested)  Comments: noted compensations at trunk with hip rotational testing   FUNCTIONAL TESTS:  5xSTS: 14 sec standard chair no UE support, increased low back pain that eases with rest  GAIT: Distance walked: within clinic Assistive device utilized: None Level of assistance: Complete Independence Comments: reduced truncal ROM and arm swing B, partial step through pattern, reduced step length, reduced hip extension B  TODAY'S TREATMENT: 09/11/22 See HEP; further evaluation   [facility] Adult PT Treatment:                   DATE: 09/11/22 Therapeutic Exercise: Access Code: [email or web address] URL: [email or web address] Date: 09/11/2022 Prepared by: Gillermo Murdoch  Exercises - Supine Piriformis Stretch with Leg Straight  - 2 x daily - 7 x weekly - 1 sets - 3 reps - 30 sec  hold - Hooklying Hamstring Stretch with Strap  - 2 x daily - 7 x weekly - 1 sets - 3 reps - 30 sec  hold - Hip Flexor Stretch at Edge of Bed  - 2 x daily - 7 x weekly - 1 sets - 3 reps - 30 sec  hold - Seated Hip Flexor Stretch  - 2 x daily - 7 x weekly - 1 sets - 3 reps - 30 sec  hold - Supine Transversus Abdominis Bracing with Pelvic Floor Contraction  - 2 x daily - 7 x weekly - 1 sets - 10 reps - 10sec  hold  Patient Education - TENS Unit - Posture and Body Mechanics - Trigger Point Dry  Needling  PATIENT EDUCATION:  Education details: Pt education on PT impairments, prognosis, and POC. Informed consent. Rationale for interventions, safe/appropriate HEP performance Person educated: Patient Education method: Explanation, Demonstration, Tactile cues, Verbal cues, and Handouts Education comprehension: verbalized understanding, returned demonstration, verbal cues required, tactile cues required, and needs further education   HOME EXERCISE PROGRAM: Education on posterior pelvic tilts 3x10 daily, education on safe performance, pain free ROM Education on beginner walking program, activity modification, safety w/ activity  ASSESSMENT:  CLINICAL IMPRESSION: 09/11/22: Patient reports increased symptoms with increased functional activities. She has limited trunk extension; tightness in the hip flexors, piriformis; glut med; QL, lumbar paraspinals. Added exercises to address tightness and initiate core stabilization. Trial of TENS for pain management, Provided education for TENS; posture and body mechanics; DN  EVAL: Pt is a pleasant 71 year old woman who arrives to PT evaluation on this date for back pain. Spouse present for initial evaluation per pt request. Pt reports difficulty with daily tasks such as cooking, cleaning, and other housework due to pain. Reports reduction in overall activity levels and increased pain while performing quality of life activities such as playing with grandchildren. During today's session pt demonstrates significant limitations in lumbar mobility and hip strength which are limiting ability to perform aforementioned activities. 5xSTS performed in 14 sec which is at or above cutoff score for fall risk in majority of populations (12-14 sec, varying). Pt tolerates HEP with no increase in pain, cues for home performance. Significant time spent with pt education as above, pt and spouse both engaged and inquisitive. Recommend skilled PT to address aforementioned  deficits to improve functional independence/tolerance. Pt departs today's session in no acute distress, all voiced questions/concerns addressed appropriately from PT perspective.    OBJECTIVE IMPAIRMENTS: Abnormal gait, decreased activity tolerance, decreased balance, decreased endurance, decreased mobility, difficulty walking, decreased ROM, decreased strength, and pain.   ACTIVITY LIMITATIONS: carrying, lifting, bending, standing, squatting, transfers, and locomotion level  PARTICIPATION LIMITATIONS: meal prep, cleaning, laundry, shopping, community activity, and yard work  PERSONAL FACTORS: Age, Time since onset of injury/illness/exacerbation, and 3+ comorbidities: (cardiac, anxiety/depression, osteopenia, previous MSK history)  are also affecting patient's functional outcome.   REHAB POTENTIAL: Good  CLINICAL DECISION MAKING: Evolving/moderate complexity  EVALUATION COMPLEXITY: Moderate   GOALS: Goals reviewed with patient? No due to time constraints  SHORT  TERM GOALS: Target date: 10/03/2022  Pt will demonstrate appropriate understanding and performance of initially prescribed HEP in order to facilitate improved independence with management of symptoms.  Baseline: HEP provided on eval Goal status: INITIAL   2. Pt will score greater than or equal to 42% on FOTO in order to demonstrate improved perception of function due to symptoms.  Baseline: 34%  Goal status: INITIAL   LONG TERM GOALS: Target date: 10/31/2022  Pt will score >47% on FOTO in order to demonstrate improved perception of functional status due to symptoms.  Baseline: 34% Goal status: INITIAL  2.  Pt will demonstrate >25% lumbar extension AROM in order to demonstrate improved tolerance to functional movement patterns.  Baseline: see ROM chart above Goal status: INITIAL  3.  Pt will demonstrate symmetrical hip MMT in order to demonstrate improved strength for functional movements.  Baseline: see MMT chart  above Goal status: INITIAL  4. Pt will perform 5xSTS in <10 sec in order to demonstrate reduced fall risk and improved functional independence. (MCID of 2.3sec)  Baseline: 14 standard chair no UE support  Goal status: INITIAL   5. Pt will report ability to perform typical household activity such as vacuuming/sweeping with less than 2 point increase in low back pain pain on NPS in order to demonstrate improved functional tolerance.   Baseline: up to 6-7/10 pain on NPS with vacuuming/broom  Goal status: INITIAL    PLAN:  PT FREQUENCY: 1-2x/week  PT DURATION: 8 weeks  PLANNED INTERVENTIONS: Therapeutic exercises, Therapeutic activity, Neuromuscular re-education, Balance training, Gait training, Patient/Family education, Self Care, Joint mobilization, Stair training, Aquatic Therapy, Dry Needling, Spinal mobilization, Cryotherapy, Moist heat, Taping, Manual therapy, and Re-evaluation.  PLAN FOR NEXT SESSION: Access and progress ROM/strengthening exercises as indicated, review HEP; trial of DN and manual work.   Caydance Kuehnle P. Helene Kelp PT, MPH 09/11/22 8:46 AM

## 2022-09-14 ENCOUNTER — Other Ambulatory Visit: Payer: Self-pay | Admitting: Cardiology

## 2022-09-14 DIAGNOSIS — E7841 Elevated Lipoprotein(a): Secondary | ICD-10-CM

## 2022-09-14 DIAGNOSIS — I2510 Atherosclerotic heart disease of native coronary artery without angina pectoris: Secondary | ICD-10-CM

## 2022-09-18 ENCOUNTER — Encounter: Payer: Medicare PPO | Admitting: Rehabilitative and Restorative Service Providers"

## 2022-09-19 ENCOUNTER — Encounter: Payer: Self-pay | Admitting: Rehabilitative and Restorative Service Providers"

## 2022-09-19 ENCOUNTER — Ambulatory Visit: Payer: Medicare PPO | Attending: Neurological Surgery | Admitting: Rehabilitative and Restorative Service Providers"

## 2022-09-19 DIAGNOSIS — R2689 Other abnormalities of gait and mobility: Secondary | ICD-10-CM | POA: Diagnosis not present

## 2022-09-19 DIAGNOSIS — M5459 Other low back pain: Secondary | ICD-10-CM | POA: Diagnosis not present

## 2022-09-19 DIAGNOSIS — M6281 Muscle weakness (generalized): Secondary | ICD-10-CM | POA: Diagnosis not present

## 2022-09-19 NOTE — Therapy (Signed)
OUTPATIENT PHYSICAL THERAPY THORACOLUMBAR TREATMENT   Patient Name: Monique Miller MRN: [id number] DOB:11-20-1950, 71 y.o., female Today's Date: 09/19/2022   PT End of Session - 09/19/22 1121     Visit Number 3    Number of Visits 17    Date for PT Re-Evaluation 10/31/22    Authorization Type [employer]    Authorization Time Period auth pending    Progress Note Due on Visit 10    PT Start Time 1145    PT Stop Time 1233    PT Time Calculation (min) 48 min    Activity Tolerance Patient tolerated treatment well             Past Medical History:  Diagnosis Date   Allergy    Anemia    "many years ago"   Anxiety    Arthritis    Cancer ([place])    skin cancer (basal cell)   Constipation by delayed colonic transit    Coronary artery disease    Depression    see Meridith Child psychotherapist and Trey Paula   Dyspnea    occ   Grover's disease    Headache    Hx of migraines    NSTEMI (non-ST elevated myocardial infarction) ([place]) 02/2016   Pneumonia    walking pneumonia   Restless leg syndrome    Thyroid disease    hx of hypothyroid   Past Surgical History:  Procedure Laterality Date   APPENDECTOMY     AUGMENTATION MAMMAPLASTY Bilateral    Patient doesn't remember date   BACK SURGERY     L4-L5   BREAST SURGERY     augmentation   CARDIAC CATHETERIZATION N/A 03/08/2016   Procedure: Left Heart Cath and Coronary Angiography;  Surgeon: Adrian Prows, MD;  Location: [facility];  Service: Cardiovascular;  Laterality: N/A;   CARDIAC CATHETERIZATION N/A 03/08/2016   Procedure: Coronary Stent Intervention;  Surgeon: Adrian Prows, MD;  Location: [facility];  Service: Cardiovascular;  Laterality: N/A;  Resolute 3.5x15   CARDIAC CATHETERIZATION N/A 03/08/2016   Procedure: Intravascular Ultrasound/IVUS;  Surgeon: Adrian Prows, MD;  Location: [facility];  Service: Cardiovascular;  Laterality: N/A;  RCA MID   CESAREAN SECTION     x2   COLONOSCOPY     CORONARY ANGIOGRAM   03/08/2016   CORONARY ANGIOPLASTY     DES LAD, DES D1 02/16/16 [facility], [place]), DES RCA 03/08/16 [facility])   LAMINECTOMY WITH POSTERIOR LATERAL ARTHRODESIS LEVEL 2 N/A 03/07/2017   Procedure: Lumbar Four-Five, Lumbar Five-Sacral One Posterior Lateral Fusion with Removal of pedicle screws;  Surgeon: Eustace Moore, MD;  Location: [facility];  Service: Neurosurgery;  Laterality: N/A;   Patient Active Problem List   Diagnosis Date Noted   S/P lumbar spinal fusion 03/07/2017   Osteopenia 03/14/2016   Post PTCA 03/08/2016   CAD (coronary artery disease), native coronary artery 03/05/2016   Spinal stenosis of lumbar region at multiple levels 08/06/2014   Anxiety 10/02/2012   Depression 10/02/2012   Menopausal disorder 10/02/2012   Attention deficit disorder 10/02/2012    PCP: [name], NP  REFERRING PROVIDER: Eustace Moore, MD  REFERRING DIAG: M51.36 (ICD-10-CM) - Other intervertebral disc degeneration, lumbar region  Rationale for Evaluation and Treatment Rehabilitation  THERAPY DIAG:  Other low back pain  Muscle weakness (generalized)  Other abnormalities of gait and mobility  ONSET DATE: Chronic, worsening exacerbation over past few months  SUBJECTIVE:  SUBJECTIVE STATEMENT: 09/19/22: Patient has not done any exercise at home in the past week. Patient has been helping care for grandchildren in the past week and has been busy. She still worse pain  this morning but is decreased now. Pain is in center of LB, Rt > Lt. Not sure about the DN or TENS   Eval: Longstanding history of low back pain with previous surgeries. Pt states has been worsening recently and has had to reduce activity levels. Pt states that she is typically quite active and enjoys walking, would like to get back to it. Has two young  grandchildren she enjoys spending time with. Pt has difficulty cooking, cleaning, (difficulty w standing activities >35mn), yardwork. Occasional shooting pains into LE, occurs in both LE but only one at a time, goes along lateral thigh to knee. Pt report some difficulty having bowel movements when pain is worse, denies difficulty controlling bowels. Reports some longstanding issues with bladder that she does not feel is related to back. States bowel/bladder symptoms have been stable, had MRI. Denies saddle anesthesia. Denies fevers or night sweats.   PERTINENT HISTORY:  CAD, osteopenia, anxiety/depression, s/p lumbar spinal fusion (hardware removal 2018)  PAIN:  Are you having pain: yes, 0/10' this morning 7/10 lasts ~ 1 hour decreases gradually Location: central low back pain, occasional B LE pain through lateral thighs (pt states only occurs in 1 at a time) How would you describe your pain? shooting Best in past week: 0/10 Worst in past week: 6-7/10 Aggravating factors: increased activity, bending, lifting, standing >152m, yardwork, housework (particularly vacuuming and sweeping) Easing factors: rest, activity modification, medication   PRECAUTIONS: osteopenia s/p lumbar fusion  WEIGHT BEARING RESTRICTIONS: No  FALLS:  Has patient fallen in last 6 months? 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 Progressive adjacent segment disease at L3-4 with degenerative edema, mild spinal stenosis, and moderate left neural foraminal stenosis. 2. Mild lateral recess and neural foraminal stenosis at L2-3, stable to mildly  progressed.    PATIENT SURVEYS:  FOTO: 34%, predicted 47%  SCREENING FOR RED FLAGS: Red flag questioning unremarkable, MRI as above  COGNITION: Overall cognitive status: Within functional limits for tasks assessed     SENSATION: Light touch intact B LE  POSTURE: grossly intact  PALPATION: Deferred on this date given time constraints  LUMBAR ROM:   AROM eval  Flexion Mid shin, p!  Extension 10%  Right lateral flexion   Left lateral flexion   Right rotation 50%  Left rotation 50%   (Blank rows = not tested) Comments: stiffness w/ rotation, no pain  LOWER EXTREMITY MMT:    MMT Right eval Left eval  Hip flexion 4+ 5  Hip abduction (modified sitting) 5 5  Hip internal rotation 3+ mild pain 4  Hip external rotation 4+ 4  Knee flexion 4 4  Knee extension 4 4   (Blank rows = not tested)  Comments: noted compensations at trunk with hip rotational testing   FUNCTIONAL TESTS:  5xSTS: 14 sec standard chair no UE support, increased low back pain that eases with rest  GAIT: Distance walked:  within clinic Assistive device utilized: None Level of assistance: Complete Independence Comments: reduced truncal ROM and arm swing B, partial step through pattern, reduced step length, reduced hip extension B  TODAY'S TREATMENT: 09/19/22 Therapeutic Exercise: Hip flexor stretch sitting 30 sec x 2 Hip flexor stretch supine 30 sec x 2 Hamstring stretch supine with strap 30 sec x 2 Piriformis stretch travell 30 sec x 2  Transverse abdominals 10 sec x 5 supine   Trigger Point Dry-Needling  Treatment instructions: Expect mild to moderate muscle soreness. S/S of pneumothorax if dry needled over a lung field, and to seek immediate medical attention should they occur. Patient verbalized understanding of these instructions and education.  Patient Consent Given: Yes Education handout provided: yes Muscles treated: bilat lumbar paraspinals  Electrical stimulation performed:  Yes Parameters: mAmp current (mild) - to pt tolerance  Treatment response/outcome: minimal decreased palpable tightness noted    Modalities:  TENS bilat lumbar spine x 10 min Moist heat LB x 10 min    [facility] Adult PT Treatment:                   DATE: 09/11/22 Therapeutic Exercise: Access Code: [email or web address] URL: [email or web address] Date: 09/11/2022 Prepared by: Gillermo Murdoch  Exercises - Supine Piriformis Stretch with Leg Straight  - 2 x daily - 7 x weekly - 1 sets - 3 reps - 30 sec  hold - Hooklying Hamstring Stretch with Strap  - 2 x daily - 7 x weekly - 1 sets - 3 reps - 30 sec  hold - Hip Flexor Stretch at Edge of Bed  - 2 x daily - 7 x weekly - 1 sets - 3 reps - 30 sec  hold - Seated Hip Flexor Stretch  - 2 x daily - 7 x weekly - 1 sets - 3 reps - 30 sec  hold - Supine Transversus Abdominis Bracing with Pelvic Floor Contraction  - 2 x daily - 7 x weekly - 1 sets - 10 reps - 10sec  hold  Patient Education - TENS Unit - Posture and Body Mechanics - Trigger Point Dry Needling  PATIENT EDUCATION:  Education details: Pt education on PT impairments, prognosis, and POC. Informed consent. Rationale for interventions, safe/appropriate HEP performance Person educated: Patient Education method: Explanation, Demonstration, Tactile cues, Verbal cues, and Handouts Education comprehension: verbalized understanding, returned demonstration, verbal cues required, tactile cues required, and needs further education   HOME EXERCISE PROGRAM: Education on posterior pelvic tilts 3x10 daily, education on safe performance, pain free ROM Education on beginner walking program, activity modification, safety w/ activity  ASSESSMENT:  CLINICAL IMPRESSION: 09/19/22: Gesselle has been babysitting for her grandchildren for the past week and has not done any exercise. Patient reports increased symptoms with increased functional activities and worse pain in the mornings. Discussed positions for sleep.  [place] has limited trunk extension; tightness in the hip flexors, piriformis; glut med; QL, lumbar paraspinals. Reviewed  exercises but no new exercises given today.  Encouraged patient to work on exercises more consistently. Will progress with core stabilization at next visit. Trial of TENS for pain management.   EVAL: Pt is a pleasant 71 year old woman who arrives to PT evaluation on this date for back pain. Spouse present for initial evaluation per pt request. Pt reports difficulty with daily tasks such as cooking, cleaning, and other housework due to pain. Reports reduction in overall activity levels and increased pain while performing quality of life activities such as playing with grandchildren. During  today's session pt demonstrates significant limitations in lumbar mobility and hip strength which are limiting ability to perform aforementioned activities. 5xSTS performed in 14 sec which is at or above cutoff score for fall risk in majority of populations (12-14 sec, varying). Pt tolerates HEP with no increase in pain, cues for home performance. Significant time spent with pt education as above, pt and spouse both engaged and inquisitive. Recommend skilled PT to address aforementioned deficits to improve functional independence/tolerance. Pt departs today's session in no acute distress, all voiced questions/concerns addressed appropriately from PT perspective.    OBJECTIVE IMPAIRMENTS: Abnormal gait, decreased activity tolerance, decreased balance, decreased endurance, decreased mobility, difficulty walking, decreased ROM, decreased strength, and pain.   ACTIVITY LIMITATIONS: carrying, lifting, bending, standing, squatting, transfers, and locomotion level  PARTICIPATION LIMITATIONS: meal prep, cleaning, laundry, shopping, community activity, and yard work  PERSONAL FACTORS: Age, Time since onset of injury/illness/exacerbation, and 3+ comorbidities: (cardiac, anxiety/depression, osteopenia, previous  MSK history)  are also affecting patient's functional outcome.   REHAB POTENTIAL: Good  CLINICAL DECISION MAKING: Evolving/moderate complexity  EVALUATION COMPLEXITY: Moderate   GOALS: Goals reviewed with patient? No due to time constraints  SHORT TERM GOALS: Target date: 10/03/2022  Pt will demonstrate appropriate understanding and performance of initially prescribed HEP in order to facilitate improved independence with management of symptoms.  Baseline: HEP provided on eval Goal status: INITIAL   2. Pt will score greater than or equal to 42% on FOTO in order to demonstrate improved perception of function due to symptoms.  Baseline: 34%  Goal status: INITIAL   LONG TERM GOALS: Target date: 10/31/2022  Pt will score >47% on FOTO in order to demonstrate improved perception of functional status due to symptoms.  Baseline: 34% Goal status: INITIAL  2.  Pt will demonstrate >25% lumbar extension AROM in order to demonstrate improved tolerance to functional movement patterns.  Baseline: see ROM chart above Goal status: INITIAL  3.  Pt will demonstrate symmetrical hip MMT in order to demonstrate improved strength for functional movements.  Baseline: see MMT chart above Goal status: INITIAL  4. Pt will perform 5xSTS in <10 sec in order to demonstrate reduced fall risk and improved functional independence. (MCID of 2.3sec)  Baseline: 14 standard chair no UE support  Goal status: INITIAL   5. Pt will report ability to perform typical household activity such as vacuuming/sweeping with less than 2 point increase in low back pain pain on NPS in order to demonstrate improved functional tolerance.   Baseline: up to 6-7/10 pain on NPS with vacuuming/broom  Goal status: INITIAL    PLAN:  PT FREQUENCY: 1-2x/week  PT DURATION: 8 weeks  PLANNED INTERVENTIONS: Therapeutic exercises, Therapeutic activity, Neuromuscular re-education, Balance training, Gait training, Patient/Family  education, Self Care, Joint mobilization, Stair training, Aquatic Therapy, Dry Needling, Spinal mobilization, Cryotherapy, Moist heat, Taping, Manual therapy, and Re-evaluation.  PLAN FOR NEXT SESSION: Access and progress ROM/strengthening exercises as indicated, review HEP; assess response to trial of DN and manual work. Progress with core stabilization.   Gerardo Territo P. Helene Kelp PT, MPH 09/19/22 11:23 AM

## 2022-09-20 ENCOUNTER — Encounter: Payer: Medicare PPO | Admitting: Rehabilitative and Restorative Service Providers"

## 2022-09-20 ENCOUNTER — Telehealth: Payer: Self-pay | Admitting: Adult Health

## 2022-09-20 NOTE — Telephone Encounter (Signed)
Pt called to schedule her CPE.  NP's schedule is booked for morning CPE's. Pt would be willing to take an afternoon CPE, but message continues to say 'Maximum visits reached' all the way out to the end of December. Is this an error message? Pt is asking if there is any way she can have an early afternoon CPE in November or December?  Please advise.

## 2022-09-20 NOTE — Telephone Encounter (Signed)
Please advise 

## 2022-09-21 NOTE — Telephone Encounter (Signed)
Noted  

## 2022-09-21 NOTE — Telephone Encounter (Signed)
Pt decided to go with 10/18/22 @  1 pm

## 2022-09-23 DIAGNOSIS — Z03818 Encounter for observation for suspected exposure to other biological agents ruled out: Secondary | ICD-10-CM | POA: Diagnosis not present

## 2022-09-23 DIAGNOSIS — J209 Acute bronchitis, unspecified: Secondary | ICD-10-CM | POA: Diagnosis not present

## 2022-09-24 ENCOUNTER — Ambulatory Visit: Payer: Medicare PPO | Admitting: Rehabilitative and Restorative Service Providers"

## 2022-09-27 ENCOUNTER — Encounter: Payer: Self-pay | Admitting: Rehabilitative and Restorative Service Providers"

## 2022-09-27 ENCOUNTER — Ambulatory Visit: Payer: Medicare PPO | Admitting: Rehabilitative and Restorative Service Providers"

## 2022-09-27 DIAGNOSIS — R2689 Other abnormalities of gait and mobility: Secondary | ICD-10-CM

## 2022-09-27 DIAGNOSIS — M6281 Muscle weakness (generalized): Secondary | ICD-10-CM | POA: Diagnosis not present

## 2022-09-27 DIAGNOSIS — M5459 Other low back pain: Secondary | ICD-10-CM

## 2022-09-27 NOTE — Therapy (Signed)
OUTPATIENT PHYSICAL THERAPY THORACOLUMBAR TREATMENT   Patient Name: Monique Miller MRN: [id number] DOB:05/27/1951, 71 y.o., female Today's Date: 09/27/2022   PT End of Session - 09/27/22 0858     Visit Number 4    Number of Visits 17    Date for PT Re-Evaluation 10/31/22    Authorization Type [employer]    Authorization Time Period auth pending    Progress Note Due on Visit 10    PT Start Time 0857    PT Stop Time 0940    PT Time Calculation (min) 43 min    Activity Tolerance Patient tolerated treatment well             Past Medical History:  Diagnosis Date   Allergy    Anemia    "many years ago"   Anxiety    Arthritis    Cancer ([place])    skin cancer (basal cell)   Constipation by delayed colonic transit    Coronary artery disease    Depression    see Meridith Child psychotherapist and Trey Paula   Dyspnea    occ   Grover's disease    Headache    Hx of migraines    NSTEMI (non-ST elevated myocardial infarction) ([place]) 02/2016   Pneumonia    walking pneumonia   Restless leg syndrome    Thyroid disease    hx of hypothyroid   Past Surgical History:  Procedure Laterality Date   APPENDECTOMY     AUGMENTATION MAMMAPLASTY Bilateral    Patient doesn't remember date   BACK SURGERY     L4-L5   BREAST SURGERY     augmentation   CARDIAC CATHETERIZATION N/A 03/08/2016   Procedure: Left Heart Cath and Coronary Angiography;  Surgeon: Adrian Prows, MD;  Location: [facility];  Service: Cardiovascular;  Laterality: N/A;   CARDIAC CATHETERIZATION N/A 03/08/2016   Procedure: Coronary Stent Intervention;  Surgeon: Adrian Prows, MD;  Location: [place];  Service: Cardiovascular;  Laterality: N/A;  Resolute 3.5x15   CARDIAC CATHETERIZATION N/A 03/08/2016   Procedure: Intravascular Ultrasound/IVUS;  Surgeon: Adrian Prows, MD;  Location: [facility];  Service: Cardiovascular;  Laterality: N/A;  RCA MID   CESAREAN SECTION     x2   COLONOSCOPY     CORONARY ANGIOGRAM   03/08/2016   CORONARY ANGIOPLASTY     DES LAD, DES D1 02/16/16 [facility], [place]), DES RCA 03/08/16 [facility])   LAMINECTOMY WITH POSTERIOR LATERAL ARTHRODESIS LEVEL 2 N/A 03/07/2017   Procedure: Lumbar Four-Five, Lumbar Five-Sacral One Posterior Lateral Fusion with Removal of pedicle screws;  Surgeon: Eustace Moore, MD;  Location: [place];  Service: Neurosurgery;  Laterality: N/A;   Patient Active Problem List   Diagnosis Date Noted   S/P lumbar spinal fusion 03/07/2017   Osteopenia 03/14/2016   Post PTCA 03/08/2016   CAD (coronary artery disease), native coronary artery 03/05/2016   Spinal stenosis of lumbar region at multiple levels 08/06/2014   Anxiety 10/02/2012   Depression 10/02/2012   Menopausal disorder 10/02/2012   Attention deficit disorder 10/02/2012    PCP: [name], NP  REFERRING PROVIDER: Eustace Moore, MD  REFERRING DIAG: M51.36 (ICD-10-CM) - Other intervertebral disc degeneration, lumbar region  Rationale for Evaluation and Treatment Rehabilitation  THERAPY DIAG:  Other low back pain  Muscle weakness (generalized)  Other abnormalities of gait and mobility  ONSET DATE: Chronic, worsening exacerbation over past few months  SUBJECTIVE:  SUBJECTIVE STATEMENT: 09/27/22: Patient has not done any exercise at home in the past week due to being sick. She has had much less pain in the past week and feels the DN has helped.   Eval: Longstanding history of low back pain with previous surgeries. Pt states has been worsening recently and has had to reduce activity levels. Pt states that she is typically quite active and enjoys walking, would like to get back to it. Has two young grandchildren she enjoys spending time with. Pt has difficulty cooking, cleaning, (difficulty w standing activities  >38mn), yardwork. Occasional shooting pains into LE, occurs in both LE but only one at a time, goes along lateral thigh to knee. Pt report some difficulty having bowel movements when pain is worse, denies difficulty controlling bowels. Reports some longstanding issues with bladder that she does not feel is related to back. States bowel/bladder symptoms have been stable, had MRI. Denies saddle anesthesia. Denies fevers or night sweats.   PERTINENT HISTORY:  CAD, osteopenia, anxiety/depression, s/p lumbar spinal fusion (hardware removal 2018)  PAIN:  Are you having pain: yes, 0/10' this morning 4/10 lasts ~ 1 hour decreases gradually Location: central low back pain, occasional B LE pain through lateral thighs (pt states only occurs in 1 at a time) How would you describe your pain? shooting Best in past week: 0/10 Worst in past week: 6-7/10 Aggravating factors: increased activity, bending, lifting, standing >110m, yardwork, housework (particularly vacuuming and sweeping) Easing factors: rest, activity modification, medication   PRECAUTIONS: osteopenia s/p lumbar fusion  WEIGHT BEARING RESTRICTIONS: No  FALLS:  Has patient fallen in last 6 months? 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 Progressive adjacent segment disease at L3-4 with degenerative edema, mild spinal stenosis, and moderate left neural foraminal stenosis. 2. Mild lateral recess and neural foraminal stenosis at L2-3, stable to mildly progressed.    PATIENT SURVEYS:  FOTO: 34%, predicted 47%  SCREENING FOR RED FLAGS: Red flag questioning  unremarkable, MRI as above  COGNITION: Overall cognitive status: Within functional limits for tasks assessed     SENSATION: Light touch intact B LE  POSTURE: grossly intact  PALPATION: Deferred on this date given time constraints  LUMBAR ROM:   AROM eval  Flexion Mid shin, p!  Extension 10%  Right lateral flexion   Left lateral flexion   Right rotation 50%  Left rotation 50%   (Blank rows = not tested) Comments: stiffness w/ rotation, no pain  LOWER EXTREMITY MMT:    MMT Right eval Left eval  Hip flexion 4+ 5  Hip abduction (modified sitting) 5 5  Hip internal rotation 3+ mild pain 4  Hip external rotation 4+ 4  Knee flexion 4 4  Knee extension 4 4   (Blank rows = not tested)  Comments: noted compensations at trunk with hip rotational testing   FUNCTIONAL TESTS:  5xSTS: 14 sec standard chair no UE support, increased low back pain that eases with rest  GAIT: Distance walked: within clinic Assistive device utilized: None Level of assistance: Complete Independence Comments: reduced truncal ROM and arm swing B, partial step through  pattern, reduced step length, reduced hip extension B  TODAY'S TREATMENT: 09/27/22  Therapeutic Exercise: Reviewed:  Hip flexor stretch sitting 30 sec x 2 Hip flexor stretch supine 30 sec x 2 Hamstring stretch supine with strap 30 sec x 2 Piriformis stretch travell 30 sec x 2  Transverse abdominals 10 sec x 5 supine  Added:  Row green TB x 10 Shoulder ext green TB x 10  Antirotation green TB x 10 each side   Trigger Point Dry-Needling  Treatment instructions: Expect mild to moderate muscle soreness. S/S of pneumothorax if dry needled over a lung field, and to seek immediate medical attention should they occur. Patient verbalized understanding of these instructions and education.  Patient Consent Given: Yes Education handout provided: yes Muscles treated: bilat QL; lumbar paraspinals  Electrical stimulation performed:  Yes Parameters: mAmp current to pt tolerance  Treatment response/outcome: minimal decreased palpable tightness noted     09/19/22 Therapeutic Exercise: Hip flexor stretch sitting 30 sec x 2 Hip flexor stretch supine 30 sec x 2 Hamstring stretch supine with strap 30 sec x 2 Piriformis stretch travell 30 sec x 2  Transverse abdominals 10 sec x 5 supine   Trigger Point Dry-Needling  Treatment instructions: Expect mild to moderate muscle soreness. S/S of pneumothorax if dry needled over a lung field, and to seek immediate medical attention should they occur. Patient verbalized understanding of these instructions and education.  Patient Consent Given: Yes Education handout provided: yes Muscles treated: bilat lumbar paraspinals  Electrical stimulation performed: Yes Parameters: mAmp current (mild) - to pt tolerance  Treatment response/outcome: minimal decreased palpable tightness noted    Modalities:  TENS bilat lumbar spine x 10 min Moist heat LB x 10 min    [facility] Adult PT     Therapeutic Exercise: Access Code: [email or web address] URL: [email or web address] Date: 09/27/2022 Prepared by: Gillermo Murdoch  Exercises - Supine Piriformis Stretch with Leg Straight  - 2 x daily - 7 x weekly - 1 sets - 3 reps - 30 sec  hold - Hooklying Hamstring Stretch with Strap  - 2 x daily - 7 x weekly - 1 sets - 3 reps - 30 sec  hold - Hip Flexor Stretch at Edge of Bed  - 2 x daily - 7 x weekly - 1 sets - 3 reps - 30 sec  hold - Seated Hip Flexor Stretch  - 2 x daily - 7 x weekly - 1 sets - 3 reps - 30 sec  hold - Supine Transversus Abdominis Bracing with Pelvic Floor Contraction  - 2 x daily - 7 x weekly - 1 sets - 10 reps - 10sec  hold - Standing Bilateral Low Shoulder Row with Anchored Resistance  - 2 x daily - 7 x weekly - 1-3 sets - 10 reps - 2-3 sec  hold - Shoulder extension with resistance - Neutral  - 1 x daily - 7 x weekly - 1-2 sets - 10 reps - 3-5 sec  hold - Anti-Rotation Lateral Stepping  with Press  - 2 x daily - 7 x weekly - 1-2 sets - 10 reps - 2-3 sec  hold Patient Education - TENS Unit - Posture and Body Mechanics - Trigger Point Dry Needling  PATIENT EDUCATION:  Education details: Pt education on PT impairments, prognosis, and POC. Informed consent. Rationale for interventions, safe/appropriate HEP performance Person educated: Patient Education method: Explanation, Demonstration, Tactile cues, Verbal cues, and Handouts Education comprehension: verbalized understanding, returned demonstration, verbal cues required, tactile cues required,  and needs further education   HOME EXERCISE PROGRAM: Education on posterior pelvic tilts 3x10 daily, education on safe performance, pain free ROM Education on beginner walking program, activity modification, safety w/ activity  ASSESSMENT:  CLINICAL IMPRESSION: 09/27/22: Tomeika reports that she has been sick with a virus after returning from babysitting for her grandchildren for a week and has not done any exercise. Patient reports good improvement in symptoms following last visit. She feels that is a response to DN at last visit. She reports increased functional activities and less intense pain in the mornings. Discussed avoiding bent forward and twisted activities. Alyson has limited trunk extension; tightness in the hip flexors, piriformis; glut med; QL, lumbar paraspinals. Reviewed  exercises and added new exercises for core stabilization. Green TB provided today.  Encouraged patient to work on exercises more consistently. Will progress with core stabilization at next visit. Consider another trial of TENS for pain management.   EVAL: Pt is a pleasant 71 year old woman who arrives to PT evaluation on this date for back pain. Spouse present for initial evaluation per pt request. Pt reports difficulty with daily tasks such as cooking, cleaning, and other housework due to pain. Reports reduction in overall activity levels and increased pain  while performing quality of life activities such as playing with grandchildren. During today's session pt demonstrates significant limitations in lumbar mobility and hip strength which are limiting ability to perform aforementioned activities. 5xSTS performed in 14 sec which is at or above cutoff score for fall risk in majority of populations (12-14 sec, varying). Pt tolerates HEP with no increase in pain, cues for home performance. Significant time spent with pt education as above, pt and spouse both engaged and inquisitive. Recommend skilled PT to address aforementioned deficits to improve functional independence/tolerance. Pt departs today's session in no acute distress, all voiced questions/concerns addressed appropriately from PT perspective.    OBJECTIVE IMPAIRMENTS: Abnormal gait, decreased activity tolerance, decreased balance, decreased endurance, decreased mobility, difficulty walking, decreased ROM, decreased strength, and pain.   ACTIVITY LIMITATIONS: carrying, lifting, bending, standing, squatting, transfers, and locomotion level  PARTICIPATION LIMITATIONS: meal prep, cleaning, laundry, shopping, community activity, and yard work  PERSONAL FACTORS: Age, Time since onset of injury/illness/exacerbation, and 3+ comorbidities: (cardiac, anxiety/depression, osteopenia, previous MSK history)  are also affecting patient's functional outcome.   REHAB POTENTIAL: Good  CLINICAL DECISION MAKING: Evolving/moderate complexity  EVALUATION COMPLEXITY: Moderate   GOALS: Goals reviewed with patient? No due to time constraints  SHORT TERM GOALS: Target date: 10/03/2022  Pt will demonstrate appropriate understanding and performance of initially prescribed HEP in order to facilitate improved independence with management of symptoms.  Baseline: HEP provided on eval Goal status: INITIAL   2. Pt will score greater than or equal to 42% on FOTO in order to demonstrate improved perception of function  due to symptoms.  Baseline: 34%  Goal status: INITIAL   LONG TERM GOALS: Target date: 10/31/2022  Pt will score >47% on FOTO in order to demonstrate improved perception of functional status due to symptoms.  Baseline: 34% Goal status: INITIAL  2.  Pt will demonstrate >25% lumbar extension AROM in order to demonstrate improved tolerance to functional movement patterns.  Baseline: see ROM chart above Goal status: INITIAL  3.  Pt will demonstrate symmetrical hip MMT in order to demonstrate improved strength for functional movements.  Baseline: see MMT chart above Goal status: INITIAL  4. Pt will perform 5xSTS in <10 sec in order to demonstrate reduced fall  risk and improved functional independence. (MCID of 2.3sec)  Baseline: 14 standard chair no UE support  Goal status: INITIAL   5. Pt will report ability to perform typical household activity such as vacuuming/sweeping with less than 2 point increase in low back pain pain on NPS in order to demonstrate improved functional tolerance.   Baseline: up to 6-7/10 pain on NPS with vacuuming/broom  Goal status: INITIAL    PLAN:  PT FREQUENCY: 1-2x/week  PT DURATION: 8 weeks  PLANNED INTERVENTIONS: Therapeutic exercises, Therapeutic activity, Neuromuscular re-education, Balance training, Gait training, Patient/Family education, Self Care, Joint mobilization, Stair training, Aquatic Therapy, Dry Needling, Spinal mobilization, Cryotherapy, Moist heat, Taping, Manual therapy, and Re-evaluation.  PLAN FOR NEXT SESSION: Access and progress ROM/strengthening exercises as indicated, review HEP; assess response to trial of DN and manual work. Progress with core stabilization.   Evans Levee P. Helene Kelp PT, MPH 09/27/22 9:00 AM

## 2022-10-03 ENCOUNTER — Encounter: Payer: Medicare PPO | Admitting: Adult Health

## 2022-10-04 ENCOUNTER — Other Ambulatory Visit: Payer: Self-pay | Admitting: Adult Health

## 2022-10-08 NOTE — Telephone Encounter (Signed)
Pt had to rsc her cpe until jan and would like another refill on metformin

## 2022-10-10 ENCOUNTER — Ambulatory Visit: Payer: Medicare PPO | Admitting: Rehabilitative and Restorative Service Providers"

## 2022-10-15 ENCOUNTER — Encounter: Payer: Self-pay | Admitting: Rehabilitative and Restorative Service Providers"

## 2022-10-15 ENCOUNTER — Ambulatory Visit: Payer: Medicare PPO | Attending: Neurological Surgery | Admitting: Rehabilitative and Restorative Service Providers"

## 2022-10-15 DIAGNOSIS — M6281 Muscle weakness (generalized): Secondary | ICD-10-CM | POA: Insufficient documentation

## 2022-10-15 DIAGNOSIS — M5459 Other low back pain: Secondary | ICD-10-CM | POA: Insufficient documentation

## 2022-10-15 DIAGNOSIS — R2689 Other abnormalities of gait and mobility: Secondary | ICD-10-CM | POA: Diagnosis not present

## 2022-10-15 NOTE — Therapy (Signed)
OUTPATIENT PHYSICAL THERAPY THORACOLUMBAR TREATMENT   Patient Name: Monique Miller MRN: [id number] DOB:01-17-1951, 71 y.o., female Today's Date: 10/15/2022   PT End of Session - 10/15/22 0940     Visit Number 5    Number of Visits 17    Authorization Type [employer]    Authorization Time Period auth pending    Progress Note Due on Visit 10    PT Start Time 0935    PT Stop Time 1015    PT Time Calculation (min) 40 min    Activity Tolerance Patient tolerated treatment well             Past Medical History:  Diagnosis Date   Allergy    Anemia    "many years ago"   Anxiety    Arthritis    Cancer ([place])    skin cancer (basal cell)   Constipation by delayed colonic transit    Coronary artery disease    Depression    see Meridith Child psychotherapist and Trey Paula   Dyspnea    occ   Grover's disease    Headache    Hx of migraines    NSTEMI (non-ST elevated myocardial infarction) ([place]) 02/2016   Pneumonia    walking pneumonia   Restless leg syndrome    Thyroid disease    hx of hypothyroid   Past Surgical History:  Procedure Laterality Date   APPENDECTOMY     AUGMENTATION MAMMAPLASTY Bilateral    Patient doesn't remember date   BACK SURGERY     L4-L5   BREAST SURGERY     augmentation   CARDIAC CATHETERIZATION N/A 03/08/2016   Procedure: Left Heart Cath and Coronary Angiography;  Surgeon: Adrian Prows, MD;  Location: [facility];  Service: Cardiovascular;  Laterality: N/A;   CARDIAC CATHETERIZATION N/A 03/08/2016   Procedure: Coronary Stent Intervention;  Surgeon: Adrian Prows, MD;  Location: [facility];  Service: Cardiovascular;  Laterality: N/A;  Resolute 3.5x15   CARDIAC CATHETERIZATION N/A 03/08/2016   Procedure: Intravascular Ultrasound/IVUS;  Surgeon: Adrian Prows, MD;  Location: [facility];  Service: Cardiovascular;  Laterality: N/A;  RCA MID   CESAREAN SECTION     x2   COLONOSCOPY     CORONARY ANGIOGRAM  03/08/2016   CORONARY ANGIOPLASTY     DES  LAD, DES D1 02/16/16 Bartolo Darter, [place]), DES RCA 03/08/16 [facility])   LAMINECTOMY WITH POSTERIOR LATERAL ARTHRODESIS LEVEL 2 N/A 03/07/2017   Procedure: Lumbar Four-Five, Lumbar Five-Sacral One Posterior Lateral Fusion with Removal of pedicle screws;  Surgeon: Eustace Moore, MD;  Location: [facility];  Service: Neurosurgery;  Laterality: N/A;   Patient Active Problem List   Diagnosis Date Noted   S/P lumbar spinal fusion 03/07/2017   Osteopenia 03/14/2016   Post PTCA 03/08/2016   CAD (coronary artery disease), native coronary artery 03/05/2016   Spinal stenosis of lumbar region at multiple levels 08/06/2014   Anxiety 10/02/2012   Depression 10/02/2012   Menopausal disorder 10/02/2012   Attention deficit disorder 10/02/2012    PCP: [name], NP  REFERRING PROVIDER: Eustace Moore, MD  REFERRING DIAG: M51.36 (ICD-10-CM) - Other intervertebral disc degeneration, lumbar region  Rationale for Evaluation and Treatment Rehabilitation  THERAPY DIAG:  Other low back pain  Muscle weakness (generalized)  Other abnormalities of gait and mobility  ONSET DATE: Chronic, worsening exacerbation over past few months  SUBJECTIVE:  SUBJECTIVE STATEMENT: 10/15/22: Patient reports significant improvement. Patient has been working on her exercise at home "some". She has had much less pain in the few week and feels the DN has helped.   PERTINENT HISTORY:  CAD, osteopenia, anxiety/depression, s/p lumbar spinal fusion (hardware removal 2018)  PAIN:  Are you having pain: yes, 2-3/10 Location: central low back pain, occasional B LE pain through lateral thighs (pt states only occurs in 1 at a time) How would you describe your pain? shooting Best in past week: 0/10 Worst in past week: 6-7/10 Aggravating factors: increased  activity, bending, lifting, standing >58mn, yardwork, housework (particularly vacuuming and sweeping) Easing factors: rest, activity modification, medication   PRECAUTIONS: osteopenia s/p lumbar fusion  OCCUPATION: retired tPharmacist, hospital- used to enjoy being active, enjoys playing with grandchildren  PATIENT GOALS: get back into exercise, play with grandchildren    OBJECTIVE:   DIAGNOSTIC FINDINGS:  Per MRI 08/23/22 IMPRESSION:1. Progressive adjacent segment disease at L3-4 with degenerative edema, mild spinal stenosis, and moderate left neural foraminal stenosis.2. Mild lateral recess and neural foraminal stenosis at L2-3, stable to mildly progressed.    PATIENT SURVEYS:  FOTO: 34%, predicted 47%  PALPATION: 10/15/22: Muscular tightness improving through Rt/Lt QL; lumbar paraspinals compared to previous treatment  LUMBAR ROM:   AROM eval  Flexion Mid shin, p!  Extension 10%  Right lateral flexion   Left lateral flexion   Right rotation 50%  Left rotation 50%   (Blank rows = not tested) Comments: stiffness w/ rotation, no pain  LOWER EXTREMITY MMT:    MMT Right eval Left eval  Hip flexion 4+ 5  Hip abduction (modified sitting) 5 5  Hip internal rotation 3+ mild pain 4  Hip external rotation 4+ 4  Knee flexion 4 4  Knee extension 4 4   (Blank rows = not tested)  Comments: noted compensations at trunk with hip rotational testing   FUNCTIONAL TESTS:  5xSTS: 14 sec standard chair no UE support, increased low back pain that eases with rest  GAIT: Distance walked: within clinic Assistive device utilized: None Level of assistance: Complete Independence Comments: reduced truncal ROM and arm swing B, partial step through pattern, reduced step length, reduced hip extension B  TODAY'S TREATMENT: 10/15/22  Therapeutic Exercise: Reviewed:  Hip flexor stretch sitting 30 sec x 2 Hip flexor stretch supine 30 sec x 2 Hamstring stretch supine with strap 30 sec x  2 Piriformis stretch travell 30 sec x 2  Transverse abdominals 10 sec x 5 supine  Continued:  Row green TB x 10; blue x 10  Shoulder ext green TB x 10; blue x 10   Antirotation green TB x 10 each side; blue x 10 ea side Added:  Prone prop x 1-2 min Prone press up - partial range x 10  Standing trunk extension - partial range    Trigger Point Dry-Needling  Patient Consent Given: Yes Education handout provided: previously provided Muscles treated: bilat QL; lumbar paraspinals  Electrical stimulation performed: Yes Parameters: mAmp current to pt tolerance  Treatment response/outcome: minimal decreased palpable tightness noted    09/27/22  Therapeutic Exercise: Reviewed:  Hip flexor stretch sitting 30 sec x 2 Hip flexor stretch supine 30 sec x 2 Hamstring stretch supine with strap 30 sec x 2 Piriformis stretch travell 30 sec x 2  Transverse abdominals 10 sec x 5 supine  Added:  Row green TB x 10 Shoulder ext green TB x 10  Antirotation green TB x 10 each  side   Trigger Point Dry-Needling  Treatment instructions: Expect mild to moderate muscle soreness. S/S of pneumothorax if dry needled over a lung field, and to seek immediate medical attention should they occur. Patient verbalized understanding of these instructions and education.  Patient Consent Given: Yes Education handout provided: yes Muscles treated: bilat QL; lumbar paraspinals  Electrical stimulation performed: Yes Parameters: mAmp current to pt tolerance  Treatment response/outcome: minimal decreased palpable tightness noted    HOME EXERCISE PROGRAM: Access Code: [email or web address] URL: [email or web address] Date: 10/15/2022 Prepared by: Gillermo Murdoch  Exercises - Supine Piriformis Stretch with Leg Straight  - 2 x daily - 7 x weekly - 1 sets - 3 reps - 30 sec  hold - Hooklying Hamstring Stretch with Strap  - 2 x daily - 7 x weekly - 1 sets - 3 reps - 30 sec  hold - Hip Flexor Stretch at Edge of Bed  - 2 x  daily - 7 x weekly - 1 sets - 3 reps - 30 sec  hold - Seated Hip Flexor Stretch  - 2 x daily - 7 x weekly - 1 sets - 3 reps - 30 sec  hold - Supine Transversus Abdominis Bracing with Pelvic Floor Contraction  - 2 x daily - 7 x weekly - 1 sets - 10 reps - 10sec  hold - Standing Bilateral Low Shoulder Row with Anchored Resistance  - 2 x daily - 7 x weekly - 1-3 sets - 10 reps - 2-3 sec  hold - Shoulder extension with resistance - Neutral  - 1 x daily - 7 x weekly - 1-2 sets - 10 reps - 3-5 sec  hold - Anti-Rotation Lateral Stepping with Press  - 2 x daily - 7 x weekly - 1-2 sets - 10 reps - 2-3 sec  hold - Prone Press Up On Elbows  - 2 x daily - 7 x weekly - 1 sets - 3 reps - 30 sec  hold - Prone Press Up  - 2 x daily - 7 x weekly - 1 sets - 10 reps - 2-3 sec  hold - Standing Lumbar Extension  - 2 x daily - 7 x weekly - 1 sets - 2-3 reps - 2-3 sec  hold  Patient Education - TENS Unit - Posture and Body Mechanics - Trigger Point Dry Needling  ASSESSMENT:  CLINICAL IMPRESSION: 10/15/22: Monique Miller reports continued improvement in symptoms with several days when she has no pain. She has good response to DN. She reports increased functional activities and less intense pain in the mornings. She has less palpable tightness through the lumbar paraspinals and QL. Added extension in prone and standing. Discussed avoiding bent forward and twisted activities. Monique Miller has limited trunk extension; tightness in the hip flexors, piriformis; glut med; QL, lumbar paraspinals. Reviewed  exercises and added new exercises for core stabilization. Blue TB provided today.  Encouraged patient to work on exercises more consistently. Will progress with core stabilization and work on lifting at next visit. Consider another trial of TENS for pain management as indicated.   GOALS: Goals reviewed with patient? No due to time constraints 10/15/22 - goals for maintaining improvement in LBP and working on exercise consistently/lifting  principles  SHORT TERM GOALS: Target date: 10/03/2022  Pt will demonstrate appropriate understanding and performance of initially prescribed HEP in order to facilitate improved independence with management of symptoms.  Baseline: HEP provided on eval Goal status: INITIAL   2. Pt will score greater than or equal  to 42% on FOTO in order to demonstrate improved perception of function due to symptoms.  Baseline: 34%  Goal status: INITIAL   LONG TERM GOALS: Target date: 10/31/2022  Pt will score >47% on FOTO in order to demonstrate improved perception of functional status due to symptoms.  Baseline: 34% Goal status: INITIAL  2.  Pt will demonstrate >25% lumbar extension AROM in order to demonstrate improved tolerance to functional movement patterns.  Baseline: see ROM chart above Goal status: INITIAL  3.  Pt will demonstrate symmetrical hip MMT in order to demonstrate improved strength for functional movements.  Baseline: see MMT chart above Goal status: INITIAL  4. Pt will perform 5xSTS in <10 sec in order to demonstrate reduced fall risk and improved functional independence. (MCID of 2.3sec)  Baseline: 14 standard chair no UE support  Goal status: INITIAL   5. Pt will report ability to perform typical household activity such as vacuuming/sweeping with less than 2 point increase in low back pain pain on NPS in order to demonstrate improved functional tolerance.   Baseline: up to 6-7/10 pain on NPS with vacuuming/broom  Goal status: INITIAL    PLAN:  PT FREQUENCY: 1-2x/week  PT DURATION: 8 weeks  PLANNED INTERVENTIONS: Therapeutic exercises, Therapeutic activity, Neuromuscular re-education, Balance training, Gait training, Patient/Family education, Self Care, Joint mobilization, Stair training, Aquatic Therapy, Dry Needling, Spinal mobilization, Cryotherapy, Moist heat, Taping, Manual therapy, and Re-evaluation.  PLAN FOR NEXT SESSION: Access and progress ROM/strengthening  exercises as indicated, review HEP; assess response to DN and manual work. Progress with core stabilization. Work on lifting and carrying.   Meghin Thivierge P. Helene Kelp PT, MPH 10/15/22 9:40 AM

## 2022-10-18 ENCOUNTER — Encounter: Payer: Self-pay | Admitting: Adult Health

## 2022-10-18 ENCOUNTER — Encounter: Payer: Medicare PPO | Admitting: Adult Health

## 2022-10-18 ENCOUNTER — Encounter: Payer: Self-pay | Admitting: Rehabilitative and Restorative Service Providers"

## 2022-10-18 ENCOUNTER — Other Ambulatory Visit: Payer: Self-pay | Admitting: Adult Health

## 2022-10-18 ENCOUNTER — Ambulatory Visit: Payer: Medicare PPO | Admitting: Rehabilitative and Restorative Service Providers"

## 2022-10-18 ENCOUNTER — Ambulatory Visit (INDEPENDENT_AMBULATORY_CARE_PROVIDER_SITE_OTHER): Payer: Medicare PPO | Admitting: Adult Health

## 2022-10-18 VITALS — BP 122/60 | HR 66 | Temp 98.1°F | Ht 64.0 in | Wt 152.6 lb

## 2022-10-18 DIAGNOSIS — Z13220 Encounter for screening for lipoid disorders: Secondary | ICD-10-CM

## 2022-10-18 DIAGNOSIS — M6281 Muscle weakness (generalized): Secondary | ICD-10-CM | POA: Diagnosis not present

## 2022-10-18 DIAGNOSIS — M8589 Other specified disorders of bone density and structure, multiple sites: Secondary | ICD-10-CM

## 2022-10-18 DIAGNOSIS — F32A Depression, unspecified: Secondary | ICD-10-CM

## 2022-10-18 DIAGNOSIS — Z0000 Encounter for general adult medical examination without abnormal findings: Secondary | ICD-10-CM | POA: Diagnosis not present

## 2022-10-18 DIAGNOSIS — F419 Anxiety disorder, unspecified: Secondary | ICD-10-CM | POA: Diagnosis not present

## 2022-10-18 DIAGNOSIS — R2689 Other abnormalities of gait and mobility: Secondary | ICD-10-CM | POA: Diagnosis not present

## 2022-10-18 DIAGNOSIS — Z981 Arthrodesis status: Secondary | ICD-10-CM | POA: Diagnosis not present

## 2022-10-18 DIAGNOSIS — M5459 Other low back pain: Secondary | ICD-10-CM | POA: Diagnosis not present

## 2022-10-18 DIAGNOSIS — I2510 Atherosclerotic heart disease of native coronary artery without angina pectoris: Secondary | ICD-10-CM | POA: Diagnosis not present

## 2022-10-18 DIAGNOSIS — R7303 Prediabetes: Secondary | ICD-10-CM

## 2022-10-18 LAB — CBC WITH DIFFERENTIAL/PLATELET
Basophils Absolute: 0 10*3/uL (ref 0.0–0.1)
Basophils Relative: 0.7 % (ref 0.0–3.0)
Eosinophils Absolute: 0.3 10*3/uL (ref 0.0–0.7)
Eosinophils Relative: 6.1 % — ABNORMAL HIGH (ref 0.0–5.0)
HCT: 40.6 % (ref 36.0–46.0)
Hemoglobin: 13.8 g/dL (ref 12.0–15.0)
Lymphocytes Relative: 36 % (ref 12.0–46.0)
Lymphs Abs: 2.1 10*3/uL (ref 0.7–4.0)
MCHC: 34 g/dL (ref 30.0–36.0)
MCV: 93.3 fl (ref 78.0–100.0)
Monocytes Absolute: 0.6 10*3/uL (ref 0.1–1.0)
Monocytes Relative: 10.5 % (ref 3.0–12.0)
Neutro Abs: 2.7 10*3/uL (ref 1.4–7.7)
Neutrophils Relative %: 46.7 % (ref 43.0–77.0)
Platelets: 196 10*3/uL (ref 150.0–400.0)
RBC: 4.35 Mil/uL (ref 3.87–5.11)
RDW: 13.3 % (ref 11.5–15.5)
WBC: 5.7 10*3/uL (ref 4.0–10.5)

## 2022-10-18 LAB — HEMOGLOBIN A1C: Hgb A1c MFr Bld: 6.3 % (ref 4.6–6.5)

## 2022-10-18 LAB — COMPREHENSIVE METABOLIC PANEL
ALT: 13 U/L (ref 0–35)
AST: 19 U/L (ref 0–37)
Albumin: 4.1 g/dL (ref 3.5–5.2)
Alkaline Phosphatase: 53 U/L (ref 39–117)
BUN: 16 mg/dL (ref 6–23)
CO2: 28 mEq/L (ref 19–32)
Calcium: 9.1 mg/dL (ref 8.4–10.5)
Chloride: 101 mEq/L (ref 96–112)
Creatinine, Ser: 0.87 mg/dL (ref 0.40–1.20)
GFR: 68.14 mL/min (ref >60.00)
Glucose, Bld: 98 mg/dL (ref 70–99)
Potassium: 4.2 mEq/L (ref 3.5–5.1)
Sodium: 136 mEq/L (ref 135–145)
Total Bilirubin: 0.6 mg/dL (ref 0.2–1.2)
Total Protein: 6.8 g/dL (ref 6.0–8.3)

## 2022-10-18 LAB — LIPID PANEL
Cholesterol: 131 mg/dL (ref 0–200)
HDL: 63.4 mg/dL (ref >39.00)
LDL Cholesterol: 57 mg/dL (ref 0–99)
NonHDL: 67.29
Total CHOL/HDL Ratio: 2
Triglycerides: 53 mg/dL (ref 0.0–149.0)
VLDL: 10.6 mg/dL (ref 0.0–40.0)

## 2022-10-18 LAB — TSH: TSH: 2.34 u[IU]/mL (ref 0.35–5.50)

## 2022-10-18 MED ORDER — METFORMIN HCL 500 MG PO TABS: 500.0000 mg | ORAL_TABLET | Freq: Every day | ORAL | 1 refills | Status: DC

## 2022-10-18 NOTE — Patient Instructions (Signed)
It was great seeing you today   We will follow up with you regarding your lab work   Please let me know if you need anything   

## 2022-10-18 NOTE — Progress Notes (Signed)
Subjective:    Patient ID: Monique Miller, female    DOB: [birth year], 71 y.o.   MRN: [id number]  HPI Patient presents for yearly preventative medicine examination. She is a pleasant 71 year old female who  has a past medical history of Allergy, Anemia, Anxiety, Arthritis, Cancer ([place]), Constipation by delayed colonic transit, Coronary artery disease, Depression, Dyspnea, Grover's disease, Headache, migraines, NSTEMI (non-ST elevated myocardial infarction) ([place]) (02/2016), Pneumonia, Restless leg syndrome, and Thyroid disease.  CAD-has a history of heart cath and coronary artery angioplasty in 2017.  She is currently prescribed Crestor 20 mg daily, aspirin 81 mg, metoprolol 25 mg ER.  She is managed by cardiology on a routine basis Lab Results  Component Value Date   CHOL 131 10/20/2021   HDL 66.30 10/20/2021   LDLCALC 56 10/20/2021   LDLDIRECT 115.5 11/09/2011   TRIG 46.0 10/20/2021   CHOLHDL 2 10/20/2021   Hypertension -managed with metoprolol 25 mg ER and ramipril 2.5 mg daily.  She denies dizziness, lightheadedness, chest pain, or shortness of breath BP Readings from Last 3 Encounters:  10/18/22 122/60  03/21/22 (!) 101/52  10/18/21 120/68   Anxiety/Depression -managed with Prozac 80 mgdaily.  He does feel controlled on this dose.  Glucose intolerance-managed with metformin 250 mg daily. Lab Results  Component Value Date   HGBA1C 6.2 10/20/2021   Osteopenia -history of osteopenia.  Her last bone density screen was in 2023 with a T-score of -2.3.  She does take Os-Cal on a daily basis  Chronic Low back pain - is currently going through PT/dry needling and takes Celebrex 200 mg PRN   All immunizations and health maintenance protocols were reviewed with the patient and needed orders were placed.  Appropriate screening laboratory values were ordered for the patient including screening of hyperlipidemia, renal function and hepatic function.  Medication reconciliation,  past medical  history, social history, problem list and allergies were reviewed in detail with the patient  Goals were established with regard to weight loss, exercise, and  diet in compliance with medications  Review of Systems  Constitutional: Negative.   HENT: Negative.    Eyes: Negative.   Respiratory: Negative.    Cardiovascular: Negative.   Gastrointestinal: Negative.   Endocrine: Negative.   Genitourinary: Negative.   Musculoskeletal: Negative.   Skin: Negative.   Allergic/Immunologic: Negative.   Neurological: Negative.   Hematological: Negative.   Psychiatric/Behavioral: Negative.     Past Medical History:  Diagnosis Date   Allergy    Anemia    "many years ago"   Anxiety    Arthritis    Cancer ([place])    skin cancer (basal cell)   Constipation by delayed colonic transit    Coronary artery disease    Depression    see Meridith Child psychotherapist and Trey Paula   Dyspnea    occ   Grover's disease    Headache    Hx of migraines    NSTEMI (non-ST elevated myocardial infarction) ([place]) 02/2016   Pneumonia    walking pneumonia   Restless leg syndrome    Thyroid disease    hx of hypothyroid    Social History   Socioeconomic History   Marital status: Married    Spouse name: Not on file   Number of children: 2   Years of education: Not on file   Highest education level: Master's degree (e.g., MA, MS, MEng, MEd, MSW, MBA)  Occupational History   Not on file  Tobacco Use  Smoking status: Never   Smokeless tobacco: Never  Vaping Use   Vaping Use: Never used  Substance and Sexual Activity   Alcohol use: Yes    Alcohol/week: 7.0 standard drinks of alcohol    Types: 7 Glasses of wine per week    Comment: occassionally   Drug use: No   Sexual activity: Not on file  Other Topics Concern   Not on file  Social History Narrative   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    Social Determinants of Health   Financial Resource Strain: Low Risk  (07/17/2022)   Overall Financial Resource Strain (CARDIA)    Difficulty of Paying Living Expenses: Not hard at all  Food Insecurity: No Food Insecurity (07/17/2022)   Hunger Vital Sign    Worried About Running Out of Food in the Last Year: Never true    Ran Out of Food in the Last Year: Never true  Transportation Needs: No Transportation Needs (07/17/2022)   PRAPARE - Hydrologist (Medical): No    Lack of Transportation (Non-Medical): No  Physical Activity: Inactive (07/17/2022)   Exercise Vital Sign    Days of Exercise per Week: 0 days    Minutes of Exercise per Session: 0 min  Stress: No Stress Concern Present (07/17/2022)   [place]    Feeling of Stress : Not at all  Social Connections: Moderately Integrated (10/14/2021)   Social Connection and Isolation Panel [NHANES]    Frequency of Communication with Friends and Family: More than three times a week    Frequency of Social Gatherings with Friends and Family: Three times a week    Attends Religious Services: 1 to 4 times per year    Active Member of Clubs or Organizations: No    Attends Archivist Meetings: Not on file    Marital Status: Married  Intimate Partner Violence: Not At Risk (07/12/2021)   Humiliation, Afraid, Rape, and Kick questionnaire    Fear of Current or Ex-Partner: No    Emotionally Abused: No    Physically Abused: No    Sexually Abused: No    Past Surgical History:  Procedure Laterality Date   APPENDECTOMY     AUGMENTATION MAMMAPLASTY Bilateral    Patient doesn't remember date   BACK SURGERY     L4-L5   BREAST SURGERY     augmentation   CARDIAC CATHETERIZATION N/A 03/08/2016   Procedure: Left Heart Cath and Coronary Angiography;  Surgeon: Adrian Prows, MD;  Location: [facility];  Service: Cardiovascular;  Laterality: N/A;   CARDIAC CATHETERIZATION N/A 03/08/2016   Procedure: Coronary Stent Intervention;  Surgeon: Adrian Prows, MD;  Location: [facility];  Service: Cardiovascular;  Laterality: N/A;  Resolute 3.5x15   CARDIAC CATHETERIZATION N/A 03/08/2016   Procedure: Intravascular Ultrasound/IVUS;  Surgeon: Adrian Prows, MD;  Location: [facility];  Service: Cardiovascular;  Laterality: N/A;  RCA MID   CESAREAN SECTION     x2   COLONOSCOPY     CORONARY ANGIOGRAM  03/08/2016   CORONARY ANGIOPLASTY     DES LAD, DES D1 02/16/16 [facility] Darter, [place]), DES RCA  03/08/16 [facility])   LAMINECTOMY WITH POSTERIOR LATERAL ARTHRODESIS LEVEL 2 N/A 03/07/2017   Procedure: Lumbar Four-Five, Lumbar Five-Sacral One Posterior Lateral Fusion with Removal of pedicle screws;  Surgeon: Eustace Moore, MD;  Location: [facility];  Service: Neurosurgery;  Laterality: N/A;    Family History  Problem Relation Age of Onset   Cancer Mother        pancreatic   Congestive Heart Failure Father    Heart attack Brother        Had blockage, almost a HA   Heart disease Brother    Cancer Maternal Uncle        pancreatic   Cancer Maternal Grandmother        pancreatic   Breast cancer Neg Hx     No Known Allergies  Current Outpatient Medications on File Prior to Visit  Medication Sig Dispense Refill   aspirin 81 MG tablet Take 81 mg by mouth daily.      calcium-vitamin D (OSCAL WITH D) 250-125 MG-UNIT tablet Take 4 tablets by mouth daily. Pt is taking 2 tabs in the am and 2 tabs in the pm     celecoxib (CELEBREX) 200 MG capsule TAKE 1 CAPSULE BY MOUTH EVERY DAY 90 capsule 1   Melatonin 10 MG TABS Take 10 mg by mouth at bedtime.     metFORMIN (GLUCOPHAGE) 500 MG tablet TAKE 1/2 TABLET BY MOUTH DAILY WITH BREAKFAST. 15 tablet 0   metoprolol succinate (TOPROL-XL) 25 MG 24 hr tablet TAKE 1 TABLET BY MOUTH DAILY. TAKE WITH OR IMMEDIATELY FOLLOWING A MEAL. 90 tablet 3   niacin (NIASPAN)  1000 MG CR tablet TAKE 1 TABLET BY MOUTH TWICE A DAY 180 tablet 2   Probiotic Product (PROBIOTIC DAILY PO) Take 1 capsule by mouth daily.     ramipril (ALTACE) 2.5 MG capsule Take 1 capsule (2.5 mg total) by mouth daily. 90 capsule 3   rosuvastatin (CRESTOR) 20 MG tablet TAKE 1 TABLET BY MOUTH EVERY DAY 90 tablet 1   FLUoxetine (PROZAC) 40 MG capsule TAKE 2 CAPSULES (80 MG TOTAL) BY MOUTH EVERY EVENING. 180 capsule 1   No current facility-administered medications on file prior to visit.    BP 122/60 (BP Location: Left Arm, Patient Position: Sitting, Cuff Size: Normal)   Pulse 66   Temp 98.1 F (36.7 C) (Oral)   Ht '5\' 4"'$  (1.626 m)   Wt 152 lb 9.6 oz (69.2 kg)   SpO2 98%   BMI 26.19 kg/m       Objective:   Physical Exam Vitals and nursing note reviewed.  Constitutional:      General: She is not in acute distress.    Appearance: Normal appearance. She is well-developed. She is not ill-appearing.  HENT:     Head: Normocephalic and atraumatic.     Right Ear: Tympanic membrane, ear canal and external ear normal. There is no impacted cerumen.     Left Ear: Tympanic membrane, ear canal and external ear normal. There is no impacted cerumen.     Nose: Nose normal. No congestion or rhinorrhea.     Mouth/Throat:     Mouth: Mucous membranes are moist.     Pharynx: Oropharynx is clear. No oropharyngeal exudate or posterior oropharyngeal erythema.  Eyes:     General:        Right eye: No discharge.        Left eye: No discharge.     Extraocular Movements: Extraocular movements intact.  Conjunctiva/sclera: Conjunctivae normal.     Pupils: Pupils are equal, round, and reactive to light.  Neck:     Thyroid: No thyromegaly.     Vascular: No carotid bruit.     Trachea: No tracheal deviation.  Cardiovascular:     Rate and Rhythm: Normal rate and regular rhythm.     Pulses: Normal pulses.     Heart sounds: Normal heart sounds. No murmur heard.    No friction rub. No gallop.   Pulmonary:     Effort: Pulmonary effort is normal. No respiratory distress.     Breath sounds: Normal breath sounds. No stridor. No wheezing, rhonchi or rales.  Chest:     Chest wall: No tenderness.  Abdominal:     General: Abdomen is flat. Bowel sounds are normal. There is no distension.     Palpations: Abdomen is soft. There is no mass.     Tenderness: There is no abdominal tenderness. There is no right CVA tenderness, left CVA tenderness, guarding or rebound.     Hernia: No hernia is present.  Musculoskeletal:        General: No swelling, tenderness, deformity or signs of injury. Normal range of motion.     Cervical back: Normal range of motion and neck supple.     Right lower leg: No edema.     Left lower leg: No edema.  Lymphadenopathy:     Cervical: No cervical adenopathy.  Skin:    General: Skin is warm and dry.     Coloration: Skin is not jaundiced or pale.     Findings: No bruising, erythema, lesion or rash.  Neurological:     General: No focal deficit present.     Mental Status: She is alert and oriented to person, place, and time.     Cranial Nerves: No cranial nerve deficit.     Sensory: No sensory deficit.     Motor: No weakness.     Coordination: Coordination normal.     Gait: Gait normal.     Deep Tendon Reflexes: Reflexes normal.  Psychiatric:        Mood and Affect: Mood normal.        Behavior: Behavior normal.        Thought Content: Thought content normal.        Judgment: Judgment normal.       Assessment & Plan:  1. Routine general medical examination at a health care facility - One year follow up  - Stay active and eat healthy  - CBC with Differential/Platelet; Future - Comprehensive metabolic panel; Future - Lipid panel; Future - TSH; Future - Hemoglobin A1c; Future  2. Coronary artery disease involving native coronary artery of native heart without angina pectoris - Per cardiology  - CBC with Differential/Platelet; Future - Comprehensive  metabolic panel; Future - Lipid panel; Future - TSH; Future - Hemoglobin A1c; Future  3. Anxiety - Continue Prozac 80 mg  - CBC with Differential/Platelet; Future - Comprehensive metabolic panel; Future - Lipid panel; Future - TSH; Future - Hemoglobin A1c; Future  4. Depression, unspecified depression type - Continue Prozac 80 mg  - CBC with Differential/Platelet; Future - Comprehensive metabolic panel; Future - Lipid panel; Future - TSH; Future - Hemoglobin A1c; Future  5. Pre-diabetes - Consider increase in metformin  - CBC with Differential/Platelet; Future - Comprehensive metabolic panel; Future - Lipid panel; Future - TSH; Future - Hemoglobin A1c; Future  6. Osteopenia of multiple sites  - Continue with bone  building regimen  - CBC with Differential/Platelet; Future - Comprehensive metabolic panel; Future - Lipid panel; Future - TSH; Future - Hemoglobin A1c; Future  7. S/P lumbar spinal fusion  - CBC with Differential/Platelet; Future - Comprehensive metabolic panel; Future - Lipid panel; Future - TSH; Future - Hemoglobin A1c; Future  [name], NP

## 2022-10-18 NOTE — Therapy (Addendum)
OUTPATIENT PHYSICAL THERAPY THORACOLUMBAR TREATMENT AND DISCHARGE   Patient Name: Monique Miller MRN: [id number] DOB:June 26, 1951, 71 y.o., female Today's Date: 10/18/2022   PT End of Session - 10/18/22 0935     Visit Number 6    Number of Visits 17    Date for PT Re-Evaluation 10/31/22    Authorization Type [employer]    Progress Note Due on Visit 10    PT Start Time 0933    PT Stop Time 1015    PT Time Calculation (min) 42 min    Activity Tolerance Patient tolerated treatment well             Past Medical History:  Diagnosis Date   Allergy    Anemia    "many years ago"   Anxiety    Arthritis    Cancer ([place])    skin cancer (basal cell)   Constipation by delayed colonic transit    Coronary artery disease    Depression    see Meridith Child psychotherapist and Trey Paula   Dyspnea    occ   Grover's disease    Headache    Hx of migraines    NSTEMI (non-ST elevated myocardial infarction) ([place]) 02/2016   Pneumonia    walking pneumonia   Restless leg syndrome    Thyroid disease    hx of hypothyroid   Past Surgical History:  Procedure Laterality Date   APPENDECTOMY     AUGMENTATION MAMMAPLASTY Bilateral    Patient doesn't remember date   BACK SURGERY     L4-L5   BREAST SURGERY     augmentation   CARDIAC CATHETERIZATION N/A 03/08/2016   Procedure: Left Heart Cath and Coronary Angiography;  Surgeon: Adrian Prows, MD;  Location: [place] [facility];  Service: Cardiovascular;  Laterality: N/A;   CARDIAC CATHETERIZATION N/A 03/08/2016   Procedure: Coronary Stent Intervention;  Surgeon: Adrian Prows, MD;  Location: [place] [facility];  Service: Cardiovascular;  Laterality: N/A;  Resolute 3.5x15   CARDIAC CATHETERIZATION N/A 03/08/2016   Procedure: Intravascular Ultrasound/IVUS;  Surgeon: Adrian Prows, MD;  Location: [place] [facility];  Service: Cardiovascular;  Laterality: N/A;  RCA MID   CESAREAN SECTION     x2   COLONOSCOPY     CORONARY ANGIOGRAM  03/08/2016   CORONARY ANGIOPLASTY      DES LAD, DES D1 02/16/16 Bartolo Darter, [place]), DES RCA 03/08/16 [facility])   LAMINECTOMY WITH POSTERIOR LATERAL ARTHRODESIS LEVEL 2 N/A 03/07/2017   Procedure: Lumbar Four-Five, Lumbar Five-Sacral One Posterior Lateral Fusion with Removal of pedicle screws;  Surgeon: Eustace Moore, MD;  Location: [place];  Service: Neurosurgery;  Laterality: N/A;   Patient Active Problem List   Diagnosis Date Noted   S/P lumbar spinal fusion 03/07/2017   Osteopenia 03/14/2016   Post PTCA 03/08/2016   CAD (coronary artery disease), native coronary artery 03/05/2016   Spinal stenosis of lumbar region at multiple levels 08/06/2014   Anxiety 10/02/2012   Depression 10/02/2012   Menopausal disorder 10/02/2012   Attention deficit disorder 10/02/2012    PCP: [name], NP  REFERRING PROVIDER: Eustace Moore, MD  REFERRING DIAG: M51.36 (ICD-10-CM) - Other intervertebral disc degeneration, lumbar region  Rationale for Evaluation and Treatment Rehabilitation  THERAPY DIAG:  Other low back pain  Muscle weakness (generalized)  Other abnormalities of gait and mobility  ONSET DATE: Chronic, worsening exacerbation over past few months  SUBJECTIVE:  SUBJECTIVE STATEMENT: 10/18/22: Patient reports some flare up of back pain and some pain in the neck and shoulders - maybe from the TB exercises. Patient has been working on her exercise at home.  She has had much less pain in the few week and feels the DN has helped.   PERTINENT HISTORY:  CAD, osteopenia, anxiety/depression, s/p lumbar spinal fusion (hardware removal 2018)  PAIN:  Are you having pain: yes, 4/10 Location: central low back pain, occasional B LE pain through lateral thighs (pt states only occurs in 1 at a time) How would you describe your pain? 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 Progressive adjacent segment disease at L3-4 with degenerative edema, mild spinal stenosis, and moderate left neural foraminal stenosis.2. Mild lateral recess and neural foraminal stenosis at L2-3, stable to mildly progressed.    PATIENT SURVEYS:  FOTO: 34%, predicted 47%  PALPATION: 10/15/22: Muscular tightness improving through Rt/Lt QL; lumbar paraspinals compared to previous treatment  LUMBAR ROM:   AROM eval  Flexion Mid shin, p!  Extension 10%  Right lateral flexion   Left lateral flexion   Right rotation 50%  Left rotation 50%   (Blank rows = not tested) Comments: stiffness w/ rotation, no pain  LOWER EXTREMITY MMT:    MMT Right eval Left eval  Hip flexion 4+ 5  Hip abduction (modified sitting) 5 5  Hip internal rotation 3+ mild pain 4  Hip external rotation 4+ 4  Knee flexion 4 4  Knee extension 4 4   (Blank rows = not tested)  Comments: noted compensations at trunk with hip rotational testing   FUNCTIONAL TESTS:  5xSTS: 14 sec standard chair no UE support, increased low back pain that eases with rest  GAIT: Distance walked: within clinic Assistive device utilized: None Level of assistance: Complete Independence Comments: reduced truncal ROM and arm swing B, partial step through pattern, reduced step length, reduced hip extension B  TODAY'S TREATMENT: 10/18/22 Therapeutic Exercise: Reviewed:  Hip flexor stretch supine 30 sec x 2 Hamstring stretch supine with strap 30 sec x 2 Piriformis stretch  travell 30 sec x 2  Transverse abdominals 10 sec x 5 supine  Continued:  Hip flexor stretch sitting 30 sec x 2 Row green TB x 10; blue x 10  Shoulder ext green TB x 10; blue x 10   Antirotation green TB x 10 each side dropped to red TB for home  Prone prop x 1-2 min Prone press up - partial range x 10  Standing trunk extension - partial range   Added: Sit to stand x 10 core engaged   Trigger Point Dry-Needling  Patient Consent Given: Yes Education handout provided: previously provided Muscles treated: bilat QL; lumbar paraspinals  Electrical stimulation performed: Yes Parameters: mAmp current to pt tolerance  Treatment response/outcome: minimal decreased palpable tightness noted   Modalities:  Moist heat lumbar spine x 10 min   10/15/22 Therapeutic Exercise: Reviewed:  Hip flexor stretch sitting 30 sec x 2 Hip flexor stretch supine 30 sec x 2 Hamstring stretch supine with strap 30 sec x 2 Piriformis stretch travell 30 sec x 2    Transverse abdominals 10 sec x 5 supine  Continued:  Row green TB x 10; blue x 10  Shoulder ext green TB x 10; blue x 10   Antirotation green TB x 10 each side; blue x 10 ea side Added:  Prone prop x 1-2 min Prone press up - partial range x 10  Standing trunk extension - partial range    Trigger Point Dry-Needling  Patient Consent Given: Yes Education handout provided: previously provided Muscles treated: bilat QL; lumbar paraspinals  Electrical stimulation performed: Yes Parameters: mAmp current to pt tolerance  Treatment response/outcome: minimal decreased palpable tightness noted    HOME EXERCISE PROGRAM: Access Code: [email or web address] URL: [email or web address] Date: 10/18/2022 Prepared by:    Exercises - Supine Piriformis Stretch with Leg Straight  - 2 x daily - 7 x weekly - 1 sets - 3 reps - 30 sec  hold - Hooklying Hamstring Stretch with Strap  - 2 x daily - 7 x weekly - 1 sets - 3 reps - 30 sec  hold - Hip Flexor  Stretch at Edge of Bed  - 2 x daily - 7 x weekly - 1 sets - 3 reps - 30 sec  hold - Seated Hip Flexor Stretch  - 2 x daily - 7 x weekly - 1 sets - 3 reps - 30 sec  hold - Supine Transversus Abdominis Bracing with Pelvic Floor Contraction  - 2 x daily - 7 x weekly - 1 sets - 10 reps - 10sec  hold - Standing Bilateral Low Shoulder Row with Anchored Resistance  - 2 x daily - 7 x weekly - 1-3 sets - 10 reps - 2-3 sec  hold - Shoulder extension with resistance - Neutral  - 1 x daily - 7 x weekly - 1-2 sets - 10 reps - 3-5 sec  hold - Anti-Rotation Lateral Stepping with Press  - 2 x daily - 7 x weekly - 1-2 sets - 10 reps - 2-3 sec  hold - Prone Press Up On Elbows  - 2 x daily - 7 x weekly - 1 sets - 3 reps - 30 sec  hold - Prone Press Up  - 2 x daily - 7 x weekly - 1 sets - 10 reps - 2-3 sec  hold - Standing Lumbar Extension  - 2 x daily - 7 x weekly - 1 sets - 2-3 reps - 2-3 sec  hold - Sit to Stand  - 2 x daily - 7 x weekly - 1 sets - 10 reps - 3-5 sec  hold  Patient Education - TENS Unit - Posture and Body Mechanics - Trigger Point Dry Needling  ASSESSMENT:  CLINICAL IMPRESSION: 10/18/22: Lawson reports some flare up of back pain and some shoulder and neck pain after doing exercises at home. Reviewed and corrected exercises - she was doing the antirotation/palloff press incorrectly. Had reported and demonstrated good improvement in symptoms with several days when she has no pain prior to flare up. She has a good response to DN. She reports increased functional activities and less intense pain in the mornings. She has less palpable tightness through the lumbar paraspinals and QL. Added extension in prone and standing. Discussed avoiding bent forward and twisted activities. Sumedha has limited trunk extension; tightness in the hip flexors, piriformis; glut med; QL, lumbar paraspinals. Reviewed  exercises and added new exercises for core stabilization. Red TB provided today for antirotation exercise.   Encouraged patient to work on exercises consistently. Added sit to   stand with hinged hips to prepare for lifting. Will progress with core stabilization and work on lifting at next visit as symptoms allow. Consider another trial of TENS for pain management as indicated.   GOALS: Goals reviewed with patient? No due to time constraints 10/15/22 - goals for maintaining improvement in LBP and working on exercise consistently/lifting principles  SHORT TERM GOALS: Target date: 10/03/2022  Pt will demonstrate appropriate understanding and performance of initially prescribed HEP in order to facilitate improved independence with management of symptoms.  Baseline: HEP provided on eval Goal status: INITIAL   2. Pt will score greater than or equal to 42% on FOTO in order to demonstrate improved perception of function due to symptoms.  Baseline: 34%  Goal status: INITIAL   LONG TERM GOALS: Target date: 10/31/2022  Pt will score >47% on FOTO in order to demonstrate improved perception of functional status due to symptoms.  Baseline: 34% Goal status: INITIAL  2.  Pt will demonstrate >25% lumbar extension AROM in order to demonstrate improved tolerance to functional movement patterns.  Baseline: see ROM chart above Goal status: INITIAL  3.  Pt will demonstrate symmetrical hip MMT in order to demonstrate improved strength for functional movements.  Baseline: see MMT chart above Goal status: INITIAL  4. Pt will perform 5xSTS in <10 sec in order to demonstrate reduced fall risk and improved functional independence. (MCID of 2.3sec)  Baseline: 14 standard chair no UE support  Goal status: INITIAL   5. Pt will report ability to perform typical household activity such as vacuuming/sweeping with less than 2 point increase in low back pain pain on NPS in order to demonstrate improved functional tolerance.   Baseline: up to 6-7/10 pain on NPS with vacuuming/broom  Goal status: INITIAL    PLAN:  PT  FREQUENCY: 1-2x/week  PT DURATION: 8 weeks  PLANNED INTERVENTIONS: Therapeutic exercises, Therapeutic activity, Neuromuscular re-education, Balance training, Gait training, Patient/Family education, Self Care, Joint mobilization, Stair training, Aquatic Therapy, Dry Needling, Spinal mobilization, Cryotherapy, Moist heat, Taping, Manual therapy, and Re-evaluation.  PLAN FOR NEXT SESSION: Access and progress ROM/strengthening exercises as indicated, review HEP; assess response to DN and manual work. Progress with core stabilization. Work on lifting and carrying.  PHYSICAL THERAPY DISCHARGE SUMMARY  Visits from Start of Care: 6  Current functional level related to goals / functional outcomes: Decreased pain, improved mobility   Remaining deficits: See above   Education / Equipment: HEP   Patient agrees to discharge. Patient goals were partially met. Patient is being discharged due to not returning since the last visit. Isabelle Course, PT,DPT01/01/2409:49 AM  Katrisha Segall P. Helene Kelp PT, MPH 10/18/22 10:11 AM

## 2022-11-02 ENCOUNTER — Other Ambulatory Visit: Payer: Self-pay | Admitting: Adult Health

## 2022-11-16 ENCOUNTER — Encounter: Payer: Medicare PPO | Admitting: Adult Health

## 2022-11-16 ENCOUNTER — Ambulatory Visit: Payer: Medicare PPO | Admitting: Adult Health

## 2022-11-16 VITALS — BP 130/80 | HR 64 | Temp 98.1°F | Ht 64.0 in | Wt 157.0 lb

## 2022-11-16 DIAGNOSIS — J0101 Acute recurrent maxillary sinusitis: Secondary | ICD-10-CM | POA: Diagnosis not present

## 2022-11-16 MED ORDER — DOXYCYCLINE HYCLATE 100 MG PO CAPS: 100.0000 mg | ORAL_CAPSULE | Freq: Two times a day (BID) | ORAL | 0 refills | Status: DC

## 2022-11-16 NOTE — Progress Notes (Signed)
Subjective:    Patient ID: Monique Miller, female    DOB: [birth year], 72 y.o.   MRN: [id number]  HPI  72 year old female who  has a past medical history of Allergy, Anemia, Anxiety, Arthritis, Cancer ([place]), Constipation by delayed colonic transit, Coronary artery disease, Depression, Dyspnea, Grover's disease, Headache, migraines, NSTEMI (non-ST elevated myocardial infarction) ([place]) (02/2016), Pneumonia, Restless leg syndrome, and Thyroid disease.  She presents to the office today for an acute issue. Her symptoms started about 2 weeks ago. Her symptoms include that sinus congestion, sinus pain/pressure, and productive cough.   She denies fevers or chills.  Review of Systems See HPI   Past Medical History:  Diagnosis Date   Allergy    Anemia    "many years ago"   Anxiety    Arthritis    Cancer ([place])    skin cancer (basal cell)   Constipation by delayed colonic transit    Coronary artery disease    Depression    see Meridith Child psychotherapist and Trey Paula   Dyspnea    occ   Grover's disease    Headache    Hx of migraines    NSTEMI (non-ST elevated myocardial infarction) ([place]) 02/2016   Pneumonia    walking pneumonia   Restless leg syndrome    Thyroid disease    hx of hypothyroid    Social History   Socioeconomic History   Marital status: Married    Spouse name: Not on file   Number of children: 2   Years of education: Not on file   Highest education level: Master's degree (e.g., MA, MS, MEng, MEd, MSW, MBA)  Occupational History   Not on file  Tobacco Use   Smoking status: Never   Smokeless tobacco: Never  Vaping Use   Vaping Use: Never used  Substance and Sexual Activity   Alcohol use: Yes    Alcohol/week: 7.0 standard drinks of alcohol    Types: 7 Glasses of wine per week    Comment: occassionally   Drug use: No   Sexual activity: Not on file  Other Topics Concern   Not on file  Social History Narrative   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    Social Determinants of Health   Financial Resource Strain: Low Risk  (07/17/2022)   Overall Financial Resource Strain (CARDIA)    Difficulty of Paying Living Expenses: Not hard at all  Food Insecurity: No Food Insecurity (11/14/2022)   Hunger Vital Sign    Worried About Running Out of Food in the Last Year: Never true    Ran Out of Food in the Last Year: Never true  Transportation Needs: No Transportation Needs (11/14/2022)   PRAPARE - Hydrologist (Medical): No    Lack of Transportation (Non-Medical): No  Physical Activity: Insufficiently Active (11/14/2022)   Exercise Vital Sign    Days of Exercise per Week: 1 day    Minutes of Exercise per Session: 20 min  Stress: No Stress Concern Present (11/14/2022)   [place]    Feeling of Stress : Not at all  Social Connections: Unknown (11/14/2022)   Social  Connection and Isolation Panel [NHANES]    Frequency of Communication with Friends and Family: More than three times a week    Frequency of Social Gatherings with Friends and Family: Once a week    Attends Religious Services: Patient refused    Active Member of Clubs or Organizations: Yes    Attends Music therapist: More than 4 times per year    Marital Status: Married  Human resources officer Violence: Not At Risk (07/12/2021)   Humiliation, Afraid, Rape, and Kick questionnaire    Fear of Current or Ex-Partner: No    Emotionally Abused: No    Physically Abused: No    Sexually Abused: No    Past Surgical History:  Procedure Laterality Date   APPENDECTOMY     AUGMENTATION MAMMAPLASTY Bilateral    Patient doesn't remember date   BACK SURGERY     L4-L5   BREAST SURGERY     augmentation   CARDIAC CATHETERIZATION N/A 03/08/2016    Procedure: Left Heart Cath and Coronary Angiography;  Surgeon: Adrian Prows, MD;  Location: [place] [facility];  Service: Cardiovascular;  Laterality: N/A;   CARDIAC CATHETERIZATION N/A 03/08/2016   Procedure: Coronary Stent Intervention;  Surgeon: Adrian Prows, MD;  Location: [place] [facility];  Service: Cardiovascular;  Laterality: N/A;  Resolute 3.5x15   CARDIAC CATHETERIZATION N/A 03/08/2016   Procedure: Intravascular Ultrasound/IVUS;  Surgeon: Adrian Prows, MD;  Location: [place] [facility];  Service: Cardiovascular;  Laterality: N/A;  RCA MID   CESAREAN SECTION     x2   COLONOSCOPY     CORONARY ANGIOGRAM  03/08/2016   CORONARY ANGIOPLASTY     DES LAD, DES D1 02/16/16 Bartolo Darter, [place]), DES RCA 03/08/16 [facility])   LAMINECTOMY WITH POSTERIOR LATERAL ARTHRODESIS LEVEL 2 N/A 03/07/2017   Procedure: Lumbar Four-Five, Lumbar Five-Sacral One Posterior Lateral Fusion with Removal of pedicle screws;  Surgeon: Eustace Moore, MD;  Location: [place];  Service: Neurosurgery;  Laterality: N/A;    Family History  Problem Relation Age of Onset   Cancer Mother        pancreatic   Congestive Heart Failure Father    Heart attack Brother        Had blockage, almost a HA   Heart disease Brother    Cancer Maternal Uncle        pancreatic   Cancer Maternal Grandmother        pancreatic   Breast cancer Neg Hx     No Known Allergies  Current Outpatient Medications on File Prior to Visit  Medication Sig Dispense Refill   aspirin 81 MG tablet Take 81 mg by mouth daily.      calcium-vitamin D (OSCAL WITH D) 250-125 MG-UNIT tablet Take 4 tablets by mouth daily. Pt is taking 2 tabs in the am and 2 tabs in the pm     celecoxib (CELEBREX) 200 MG capsule TAKE 1 CAPSULE BY MOUTH EVERY DAY 90 capsule 1   FLUoxetine (PROZAC) 40 MG capsule TAKE 2 CAPSULES (80 MG TOTAL) BY MOUTH EVERY EVENING. 180 capsule 1   Melatonin 10 MG TABS Take 10 mg by mouth at bedtime.     metFORMIN (GLUCOPHAGE) 500 MG tablet TAKE 1/2 TABLET BY  MOUTH DAILY WITH BREAKFAST 15 tablet 1   metoprolol succinate (TOPROL-XL) 25 MG 24 hr tablet TAKE 1 TABLET BY MOUTH DAILY. TAKE WITH OR IMMEDIATELY FOLLOWING A MEAL. 90 tablet 3   niacin (NIASPAN) 1000 MG CR tablet TAKE 1 TABLET  BY MOUTH TWICE A DAY 180 tablet 2   Probiotic Product (PROBIOTIC DAILY PO) Take 1 capsule by mouth daily.     ramipril (ALTACE) 2.5 MG capsule Take 1 capsule (2.5 mg total) by mouth daily. 90 capsule 3   rosuvastatin (CRESTOR) 20 MG tablet TAKE 1 TABLET BY MOUTH EVERY DAY 90 tablet 1   No current facility-administered medications on file prior to visit.    BP 130/80   Pulse 64   Temp 98.1 F (36.7 C) (Oral)   Ht '5\' 4"'$  (1.626 m)   Wt 157 lb (71.2 kg)   SpO2 100%   BMI 26.95 kg/m       Objective:   Physical Exam Vitals and nursing note reviewed.  Constitutional:      Appearance: Normal appearance.  HENT:     Nose: Congestion present.     Right Turbinates: Enlarged and swollen.     Left Turbinates: Enlarged and swollen.     Right Sinus: Maxillary sinus tenderness present. No frontal sinus tenderness.     Left Sinus: Maxillary sinus tenderness present. No frontal sinus tenderness.  Cardiovascular:     Rate and Rhythm: Normal rate and regular rhythm.     Pulses: Normal pulses.     Heart sounds: Normal heart sounds.  Pulmonary:     Effort: Pulmonary effort is normal.     Breath sounds: Normal breath sounds.  Musculoskeletal:        General: Normal range of motion.  Skin:    General: Skin is warm and dry.     Capillary Refill: Capillary refill takes less than 2 seconds.  Neurological:     General: No focal deficit present.     Mental Status: She is alert and oriented to person, place, and time.        Assessment & Plan:  1. Acute recurrent maxillary sinusitis - Will treat due to symptoms and duration  - Follow up in one year or sooner if needed - doxycycline (VIBRAMYCIN) 100 MG capsule; Take 1 capsule (100 mg total) by mouth 2 (two) times  daily.  Dispense: 14 capsule; Refill: 0  [name], NP

## 2022-12-07 ENCOUNTER — Other Ambulatory Visit: Payer: Self-pay | Admitting: Adult Health

## 2022-12-08 ENCOUNTER — Other Ambulatory Visit: Payer: Self-pay | Admitting: Adult Health

## 2022-12-25 ENCOUNTER — Encounter: Payer: Self-pay | Admitting: Adult Health

## 2022-12-26 ENCOUNTER — Ambulatory Visit: Payer: Medicare PPO | Admitting: Family Medicine

## 2022-12-26 ENCOUNTER — Encounter: Payer: Self-pay | Admitting: Family Medicine

## 2022-12-26 VITALS — BP 146/76 | HR 63 | Temp 98.0°F | Ht 64.0 in | Wt 159.2 lb

## 2022-12-26 DIAGNOSIS — J019 Acute sinusitis, unspecified: Secondary | ICD-10-CM

## 2022-12-26 MED ORDER — BENZONATATE 100 MG PO CAPS: 100.0000 mg | ORAL_CAPSULE | Freq: Three times a day (TID) | ORAL | 0 refills | Status: DC | PRN

## 2022-12-26 MED ORDER — AMOXICILLIN-POT CLAVULANATE 875-125 MG PO TABS: 1.0000 | ORAL_TABLET | Freq: Two times a day (BID) | ORAL | 0 refills | Status: DC

## 2022-12-26 NOTE — Progress Notes (Signed)
Established Patient Office Visit  Subjective   Patient ID: Monique Miller, female    DOB: 1951-03-12  Age: 72 y.o. MRN: [id number]  Chief Complaint  Patient presents with   Sinusitis    Patient complains of sinusitis, x1 week, Tried Nyquil with little relief    Headache    Patient complains of headaches   Cough    Patient complains of cough, Productive cough, Tried Nyquil with little relief, x1 week     HPI   Monique Miller is seen with concern for acute sinusitis.  She states she had typical cold-like symptoms about 8 or 9 days ago.  She thinks she may have gotten this from grandchild.  She had some cough and nasal congestion but now progressive headaches and some facial pain.  Cough now productive of green sputum.  No fever.  She was just treated for sinusitis about a month ago but did feel like she improved when taking doxycycline.  No dyspnea.  Tried over-the-counter medications with minimal relief.  Cough worse at night.  Past Medical History:  Diagnosis Date   Allergy    Anemia    "many years ago"   Anxiety    Arthritis    Cancer ([facility])    skin cancer (basal cell)   Constipation by delayed colonic transit    Coronary artery disease    Depression    see Meridith Child psychotherapist and Trey Paula   Dyspnea    occ   Grover's disease    Headache    Hx of migraines    NSTEMI (non-ST elevated myocardial infarction) ([place]) 02/2016   Pneumonia    walking pneumonia   Restless leg syndrome    Thyroid disease    hx of hypothyroid   Past Surgical History:  Procedure Laterality Date   APPENDECTOMY     AUGMENTATION MAMMAPLASTY Bilateral    Patient doesn't remember date   BACK SURGERY     L4-L5   BREAST SURGERY     augmentation   CARDIAC CATHETERIZATION N/A 03/08/2016   Procedure: Left Heart Cath and Coronary Angiography;  Surgeon: Adrian Prows, MD;  Location: [facility] [facility];  Service: Cardiovascular;  Laterality: N/A;   CARDIAC CATHETERIZATION N/A 03/08/2016   Procedure: Coronary Stent  Intervention;  Surgeon: Adrian Prows, MD;  Location: [facility] [facility];  Service: Cardiovascular;  Laterality: N/A;  Resolute 3.5x15   CARDIAC CATHETERIZATION N/A 03/08/2016   Procedure: Intravascular Ultrasound/IVUS;  Surgeon: Adrian Prows, MD;  Location: [facility];  Service: Cardiovascular;  Laterality: N/A;  RCA MID   CESAREAN SECTION     x2   COLONOSCOPY     CORONARY ANGIOGRAM  03/08/2016   CORONARY ANGIOPLASTY     DES LAD, DES D1 02/16/16 Bartolo Darter, [place]), DES RCA 03/08/16 [facility])   LAMINECTOMY WITH POSTERIOR LATERAL ARTHRODESIS LEVEL 2 N/A 03/07/2017   Procedure: Lumbar Four-Five, Lumbar Five-Sacral One Posterior Lateral Fusion with Removal of pedicle screws;  Surgeon: Eustace Moore, MD;  Location: [place];  Service: Neurosurgery;  Laterality: N/A;    reports that she has never smoked. She has never used smokeless tobacco. She reports current alcohol use of about 7.0 standard drinks of alcohol per week. She reports that she does not use drugs. family history includes Cancer in her maternal grandmother, maternal uncle, and mother; Congestive Heart Failure in her father; Heart attack in her brother; Heart disease in her brother. No Known Allergies  Review of Systems  Constitutional:  Negative for chills and  fever.  HENT:  Positive for congestion.   Respiratory:  Positive for cough.   Neurological:  Positive for headaches.      Objective:     BP (!) 146/76 (BP Location: Left Arm, Patient Position: Sitting, Cuff Size: Normal)   Pulse 63   Temp 98 F (36.7 C) (Oral)   Ht 5' 4"$  (1.626 m)   Wt 159 lb 3.2 oz (72.2 kg)   SpO2 99%   BMI 27.33 kg/m    Physical Exam Vitals reviewed.  Constitutional:      General: She is not in acute distress. HENT:     Mouth/Throat:     Mouth: Mucous membranes are moist.     Pharynx: Oropharynx is clear.  Cardiovascular:     Rate and Rhythm: Normal rate and regular rhythm.  Pulmonary:     Effort: Pulmonary effort is normal.     Breath sounds:  Normal breath sounds. No wheezing or rales.  Musculoskeletal:     Cervical back: Neck supple.  Lymphadenopathy:     Cervical: No cervical adenopathy.  Neurological:     Mental Status: She is alert.      No results found for any visits on 12/26/22.    The 10-year ASCVD risk score (Arnett DK, et al., 2019) is: 9.1%    Assessment & Plan:   Acute sinusitis.  Patient is 8 days into illness and probably initially viral.  She feels like she is worsening with some progressive headaches and facial pain.  Start Augmentin 875 mg twice daily for 10 days.  Plenty fluids and rest.  Tessalon Perles 100 mg every 8 hours as needed for cough.  Follow-up for any persistent or worsening symptoms   Carolann Littler, MD

## 2023-01-04 ENCOUNTER — Other Ambulatory Visit: Payer: Self-pay | Admitting: Adult Health

## 2023-02-27 DIAGNOSIS — B078 Other viral warts: Secondary | ICD-10-CM | POA: Diagnosis not present

## 2023-02-27 DIAGNOSIS — D225 Melanocytic nevi of trunk: Secondary | ICD-10-CM | POA: Diagnosis not present

## 2023-02-27 DIAGNOSIS — Z85828 Personal history of other malignant neoplasm of skin: Secondary | ICD-10-CM | POA: Diagnosis not present

## 2023-02-27 DIAGNOSIS — L821 Other seborrheic keratosis: Secondary | ICD-10-CM | POA: Diagnosis not present

## 2023-02-27 DIAGNOSIS — L570 Actinic keratosis: Secondary | ICD-10-CM | POA: Diagnosis not present

## 2023-02-27 DIAGNOSIS — L578 Other skin changes due to chronic exposure to nonionizing radiation: Secondary | ICD-10-CM | POA: Diagnosis not present

## 2023-02-27 DIAGNOSIS — L814 Other melanin hyperpigmentation: Secondary | ICD-10-CM | POA: Diagnosis not present

## 2023-03-07 ENCOUNTER — Other Ambulatory Visit: Payer: Self-pay | Admitting: Adult Health

## 2023-03-07 ENCOUNTER — Other Ambulatory Visit: Payer: Self-pay | Admitting: Cardiology

## 2023-03-07 DIAGNOSIS — E782 Mixed hyperlipidemia: Secondary | ICD-10-CM

## 2023-03-19 NOTE — Progress Notes (Unsigned)
Primary Physician/Referring:  Shirline Frees, NP  Patient ID: Monique Miller, female    DOB: [birth year], 72 y.o.   MRN: [id number]  No chief complaint on file.  HPI:    Monique Miller  is a 72 y.o. Caucasian female patient with NSTEMI SP PCI to LAD and D1 in [place], [place] for occluded diagonal and mid LAD in 2016, IVUS guided PCI to the RCA on  03/08/2016.  Past medical history significant for mixed hyperlipidemia with elevated LPA, LDL within normal limits presently tolerating high-dose, high intensity statin along with niacin.  She has family history of premature coronary disease with brother having had MI at age 73.  This is an annual visit. Presently doing well, no chest pian. She has not used S/L NTG recently. Symptoms are remained stable.  Also since being on beta-blockers, she has not had any further palpitations.  Past Medical History:  Diagnosis Date   Allergy    Anemia    "many years ago"   Anxiety    Arthritis    Cancer (HCC)    skin cancer (basal cell)   Constipation by delayed colonic transit    Coronary artery disease    Depression    see Meridith Engineer, production and Colen Darling   Dyspnea    occ   Grover's disease    Headache    Hx of migraines    NSTEMI (non-ST elevated myocardial infarction) (HCC) 02/2016   Pneumonia    walking pneumonia   Restless leg syndrome    Thyroid disease    hx of hypothyroid    Social History   Tobacco Use   Smoking status: Never   Smokeless tobacco: Never  Substance Use Topics   Alcohol use: Yes    Alcohol/week: 7.0 standard drinks of alcohol    Types: 7 Glasses of wine per week    Comment: occassionally  Marital Status: Married    ROS  Review of Systems  Cardiovascular:  Negative for chest pain, dyspnea on exertion and leg swelling.  Gastrointestinal:  Negative for melena.   Objective      12/26/2022    7:50 AM 11/16/2022   11:12 AM 10/18/2022   12:54 PM  Vitals with BMI  Height 5\' 4"  5\' 4"  5\' 4"   Weight 159 lbs 3 oz  157 lbs 152 lbs 10 oz  BMI 27.31 26.94 26.18  Systolic 146 130 409  Diastolic 76 80 60  Pulse 63 64 66    There were no vitals taken for this visit. There is no height or weight on file to calculate BMI.   Physical Exam Neck:     Vascular: No JVD.  Cardiovascular:     Rate and Rhythm: Normal rate and regular rhythm.     Pulses: Intact distal pulses.     Heart sounds: Normal heart sounds. No murmur heard.    No gallop.  Pulmonary:     Effort: Pulmonary effort is normal.     Breath sounds: Normal breath sounds.  Abdominal:     General: Bowel sounds are normal.     Palpations: Abdomen is soft.  Musculoskeletal:     Right lower leg: No edema.     Left lower leg: No edema.    Radiology: No results found.   Laboratory examination:     Ref Range & Units 08/03/2019  Lipoprotein (a) <75.0 nmol/L 120.7High     Recent Labs    10/18/22 1321  NA 136  K 4.2  CL  101  CO2 28  GLUCOSE 98  BUN 16  CREATININE 0.87  CALCIUM 9.1      Latest Ref Rng & Units 10/18/2022    1:21 PM 10/20/2021    9:05 AM 08/17/2020    8:05 AM  CMP  Glucose 70 - 99 mg/dL 98  94  95   BUN 6 - 23 mg/dL 16  19  22    Creatinine 0.40 - 1.20 mg/dL 4.09  8.11  9.14   Sodium 135 - 145 mEq/L 136  137  140   Potassium 3.5 - 5.1 mEq/L 4.2  4.3  4.8   Chloride 96 - 112 mEq/L 101  103  103   CO2 19 - 32 mEq/L 28  25  24    Calcium 8.4 - 10.5 mg/dL 9.1  9.1  9.3   Total Protein 6.0 - 8.3 g/dL 6.8  6.6  6.3   Total Bilirubin 0.2 - 1.2 mg/dL 0.6  0.7  0.4   Alkaline Phos 39 - 117 U/L 53  48  65   AST 0 - 37 U/L 19  30  52   ALT 0 - 35 U/L 13  22  48       Latest Ref Rng & Units 10/18/2022    1:21 PM 10/20/2021    9:05 AM 08/17/2020    8:05 AM  CBC  WBC 4.0 - 10.5 K/uL 5.7  4.5  7.0   Hemoglobin 12.0 - 15.0 g/dL 78.2  95.6  21.3   Hematocrit 36.0 - 46.0 % 40.6  41.2  35.7   Platelets 150.0 - 400.0 K/uL 196.0  174.0  186    Lipid Panel Recent Labs    10/18/22 1321  CHOL 131  TRIG 53.0  LDLCALC 57   VLDL 10.6  HDL 63.40  CHOLHDL 2    HEMOGLOBIN A1C Lab Results  Component Value Date   HGBA1C 6.3 10/18/2022   TSH 08/03/2019: Normal. 2.300  Recent Labs    10/18/22 1321  TSH 2.34   Lipoprotein (a) <75.0 nmol/L 08/06/2019: 120.7   Medications and allergies   No Known Allergies   Current Outpatient Medications:    amoxicillin-clavulanate (AUGMENTIN) 875-125 MG tablet, Take 1 tablet by mouth 2 (two) times daily., Disp: 20 tablet, Rfl: 0   aspirin 81 MG tablet, Take 81 mg by mouth daily. , Disp: , Rfl:    benzonatate (TESSALON PERLES) 100 MG capsule, Take 1 capsule (100 mg total) by mouth 3 (three) times daily as needed for cough., Disp: 30 capsule, Rfl: 0   calcium-vitamin D (OSCAL WITH D) 250-125 MG-UNIT tablet, Take 4 tablets by mouth daily. Pt is taking 2 tabs in the am and 2 tabs in the pm, Disp: , Rfl:    celecoxib (CELEBREX) 200 MG capsule, TAKE 1 CAPSULE BY MOUTH EVERY DAY, Disp: 90 capsule, Rfl: 1   doxycycline (VIBRAMYCIN) 100 MG capsule, Take 1 capsule (100 mg total) by mouth 2 (two) times daily., Disp: 14 capsule, Rfl: 0   FLUoxetine (PROZAC) 40 MG capsule, TAKE 2 CAPSULES (80 MG TOTAL) BY MOUTH EVERY EVENING., Disp: 180 capsule, Rfl: 0   Melatonin 10 MG TABS, Take 10 mg by mouth at bedtime., Disp: , Rfl:    metFORMIN (GLUCOPHAGE) 500 MG tablet, TAKE 1/2 TABLET BY MOUTH DAILY WITH BREAKFAST, Disp: 45 tablet, Rfl: 1   metoprolol succinate (TOPROL-XL) 25 MG 24 hr tablet, TAKE 1 TABLET BY MOUTH DAILY. TAKE WITH OR IMMEDIATELY FOLLOWING A MEAL., Disp: 90 tablet, Rfl:  3   niacin (NIASPAN) 1000 MG CR tablet, TAKE 1 TABLET BY MOUTH TWICE A DAY, Disp: 180 tablet, Rfl: 2   Probiotic Product (PROBIOTIC DAILY PO), Take 1 capsule by mouth daily., Disp: , Rfl:    ramipril (ALTACE) 2.5 MG capsule, Take 1 capsule (2.5 mg total) by mouth daily., Disp: 90 capsule, Rfl: 3   rosuvastatin (CRESTOR) 20 MG tablet, TAKE 1 TABLET BY MOUTH EVERY DAY, Disp: 90 tablet, Rfl: 1    Cardiac  Studies:   Coronary angiogram 03/08/2016: PCI to Prox RCA with 3.5x1 mm Resolute. Acute MI 02/16/2016 S/P PCI to LAD and D1 with the 2.75 x 23 mm Xience DES and 2.5 x 15 mm Xience DES respectively. Normal LVEF.  Lexiscan Tetrofosmin stress test 08/31/2020: Lexiscan nuclear stress test performed using 1-day protocol. Stress EKG is non-diagnostic, as this is pharmacological stress test. Rest and stress EJG shows sinus rhythm, old anteroseptal infarct.  SPECT images show small sized, mild intensity, fixed perfusion defect in apical anterior myocardium. Stress LVEF 74%. Low risk study.   Echocardiogram 08/31/2020: Normal LV systolic function with EF 60%. Left ventricle cavity is normal in size. Mild concentric hypertrophy of the left ventricle. Normal global wall motion. Calculated EF 60%. Left atrial cavity is mildly dilated. Mild (Grade I) mitral regurgitation. Mild tricuspid regurgitation. Estimated pulmonary artery systolic pressure 26 mmHg.  EKG    *** EKG 03/21/2022: Sinus bradycardia at rate of 53 bpm with sinus arrhythmia.  Left atrial enlargement, otherwise normal EKG. no significant change from 03/21/2021.   Assessment:     ICD-10-CM   1. Coronary artery disease of native artery of native heart with stable angina pectoris (HCC)  I25.118     2. Mixed hyperlipidemia  E78.2     3. Elevated lipoprotein(a)  E78.41       Recommendations:   Hanalei Mockus  is a 72 y.o. Caucasian female patient with NSTEMI SP PCI to LAD and D1 in [place], [place] for occluded diagonal and mid LAD in 2016, IVUS guided PCI to the RCA on  03/08/2016.  Past medical history significant for mixed hyperlipidemia with elevated LPA, LDL within normal limits presently tolerating high-dose, high intensity statin along with niacin.  She has family history of premature coronary disease with brother having had MI at age 51.  She is presently asymptomatic except for having made significant dietary changes, she has  noticed her blood pressure to be very soft and feels dizzy when she is currently stands up.  I will reduce the dose of Altace from 5 mg to 2.5 mg daily.  Reviewed her labs, lipids under excellent control.  In view of elevated LPA, family history and hyperlipidemia and coronary artery disease, target LDL is <55.  Since being on metoprolol succinate at a low dose, she has not had any further episodes of palpitations.  No change in EKG, I will see her back on an annual basis.    Yates Decamp, MD, [facility] 03/19/2023, 8:42 PM Office: [phone]

## 2023-03-20 ENCOUNTER — Ambulatory Visit: Payer: Medicare PPO | Admitting: Cardiology

## 2023-03-20 ENCOUNTER — Encounter: Payer: Self-pay | Admitting: Cardiology

## 2023-03-20 VITALS — BP 126/70 | HR 72 | Resp 17 | Ht 64.0 in | Wt 160.4 lb

## 2023-03-20 DIAGNOSIS — E7841 Elevated Lipoprotein(a): Secondary | ICD-10-CM

## 2023-03-20 DIAGNOSIS — I25118 Atherosclerotic heart disease of native coronary artery with other forms of angina pectoris: Secondary | ICD-10-CM

## 2023-03-20 DIAGNOSIS — E782 Mixed hyperlipidemia: Secondary | ICD-10-CM | POA: Diagnosis not present

## 2023-04-05 ENCOUNTER — Encounter: Payer: Self-pay | Admitting: Adult Health

## 2023-04-05 ENCOUNTER — Ambulatory Visit: Payer: Medicare PPO | Admitting: Adult Health

## 2023-04-05 ENCOUNTER — Ambulatory Visit (INDEPENDENT_AMBULATORY_CARE_PROVIDER_SITE_OTHER): Payer: Medicare PPO

## 2023-04-05 VITALS — BP 126/84 | HR 63 | Temp 98.5°F | Wt 157.0 lb

## 2023-04-05 DIAGNOSIS — R059 Cough, unspecified: Secondary | ICD-10-CM | POA: Diagnosis not present

## 2023-04-05 LAB — POC COVID19 BINAXNOW: SARS Coronavirus 2 Ag: NEGATIVE

## 2023-04-05 MED ORDER — PREDNISONE 10 MG PO TABS: ORAL_TABLET | ORAL | 0 refills | Status: DC

## 2023-04-05 MED ORDER — BENZONATATE 200 MG PO CAPS: 200.0000 mg | ORAL_CAPSULE | Freq: Two times a day (BID) | ORAL | 0 refills | Status: DC | PRN

## 2023-04-05 MED ORDER — HYDROCODONE BIT-HOMATROP MBR 5-1.5 MG/5ML PO SOLN: 5.0000 mL | Freq: Three times a day (TID) | ORAL | 0 refills | Status: DC | PRN

## 2023-04-05 NOTE — Progress Notes (Signed)
Subjective:    Patient ID: Monique Miller, female    DOB: 1951/04/05, 72 y.o.   MRN: [id number]  HPI 72 year old female who  has a past medical history of Allergy, Anemia, Anxiety, Arthritis, Cancer (HCC), Constipation by delayed colonic transit, Coronary artery disease, Depression, Dyspnea, Grover's disease, Headache, migraines, NSTEMI (non-ST elevated myocardial infarction) ([facility]) (02/2016), Pneumonia, Restless leg syndrome, and Thyroid disease.  She presents to the office today for an acute issue. For the last two weeks she has had a semi productive cough that gets worse at night.   She denies wheezing, shortness of breath, fevers, chills, sinus pain/pressure. She has had some clear rhinorrhea.   At home she had used some Tessalon Pearls that helped to some degree.    Review of Systems See HPI   Past Medical History:  Diagnosis Date   Allergy    Anemia    "many years ago"   Anxiety    Arthritis    Cancer (HCC)    skin cancer (basal cell)   Constipation by delayed colonic transit    Coronary artery disease    Depression    see Meridith Engineer, production and Colen Darling   Dyspnea    occ   Grover's disease    Headache    Hx of migraines    NSTEMI (non-ST elevated myocardial infarction) (HCC) 02/2016   Pneumonia    walking pneumonia   Restless leg syndrome    Thyroid disease    hx of hypothyroid    Social History   Socioeconomic History   Marital status: Married    Spouse name: Not on file   Number of children: 2   Years of education: Not on file   Highest education level: Master's degree (e.g., MA, MS, MEng, MEd, MSW, MBA)  Occupational History   Not on file  Tobacco Use   Smoking status: Never   Smokeless tobacco: Never  Vaping Use   Vaping Use: Never used  Substance and Sexual Activity   Alcohol use: Yes    Alcohol/week: 7.0 standard drinks of alcohol    Types: 7 Glasses of wine per week    Comment: occassionally   Drug use: No   Sexual activity: Not on file  Other  Topics Concern   Not on file  Social History Narrative   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    Social Determinants of Health   Financial Resource Strain: Low Risk  (07/17/2022)   Overall Financial Resource Strain (CARDIA)    Difficulty of Paying Living Expenses: Not hard at all  Food Insecurity: No Food Insecurity (11/14/2022)   Hunger Vital Sign    Worried About Running Out of Food in the Last Year: Never true    Ran Out of Food in the Last Year: Never true  Transportation Needs: No Transportation Needs (11/14/2022)   PRAPARE - Administrator, Civil Service (Medical): No    Lack of Transportation (Non-Medical): No  Physical Activity: Insufficiently Active (11/14/2022)   Exercise Vital Sign    Days of Exercise per Week: 1 day    Minutes of Exercise per Session: 20 min  Stress: No Stress Concern Present (11/14/2022)   [place]  Institute of Occupational Health - Occupational Stress Questionnaire    Feeling of Stress : Not at all  Social Connections: Unknown (11/14/2022)   Social Connection and Isolation Panel [NHANES]    Frequency of Communication with Friends and Family: More than three times a week    Frequency of Social Gatherings with Friends and Family: Once a week    Attends Religious Services: Patient declined    Active Member of Clubs or Organizations: Yes    Attends Banker Meetings: More than 4 times per year    Marital Status: Married  Catering manager Violence: Not At Risk (07/12/2021)   Humiliation, Afraid, Rape, and Kick questionnaire    Fear of Current or Ex-Partner: No    Emotionally Abused: No    Physically Abused: No    Sexually Abused: No    Past Surgical History:  Procedure Laterality Date   APPENDECTOMY     AUGMENTATION MAMMAPLASTY Bilateral    Patient doesn't  remember date   BACK SURGERY     L4-L5   BREAST SURGERY     augmentation   CARDIAC CATHETERIZATION N/A 03/08/2016   Procedure: Left Heart Cath and Coronary Angiography;  Surgeon: Yates Decamp, MD;  Location: [facility] INVASIVE [facility];  Service: Cardiovascular;  Laterality: N/A;   CARDIAC CATHETERIZATION N/A 03/08/2016   Procedure: Coronary Stent Intervention;  Surgeon: Yates Decamp, MD;  Location: [facility] INVASIVE CV LAB;  Service: Cardiovascular;  Laterality: N/A;  Resolute 3.5x15   CARDIAC CATHETERIZATION N/A 03/08/2016   Procedure: Intravascular Ultrasound/IVUS;  Surgeon: Yates Decamp, MD;  Location: [facility] INVASIVE [facility];  Service: Cardiovascular;  Laterality: N/A;  RCA MID   CESAREAN SECTION     x2   COLONOSCOPY     CORONARY ANGIOGRAM  03/08/2016   CORONARY ANGIOPLASTY     DES LAD, DES D1 02/16/16 Cristy Friedlander, [place]), DES RCA 03/08/16 [facility])   LAMINECTOMY WITH POSTERIOR LATERAL ARTHRODESIS LEVEL 2 N/A 03/07/2017   Procedure: Lumbar Four-Five, Lumbar Five-Sacral One Posterior Lateral Fusion with Removal of pedicle screws;  Surgeon: Tia Alert, MD;  Location: [facility] OR;  Service: Neurosurgery;  Laterality: N/A;    Family History  Problem Relation Age of Onset   Cancer Mother        pancreatic   Congestive Heart Failure Father    Heart attack Brother        Had blockage, almost a HA   Heart disease Brother    Cancer Maternal Uncle        pancreatic   Cancer Maternal Grandmother        pancreatic   Breast cancer Neg Hx     No Known Allergies  Current Outpatient Medications on File Prior to Visit  Medication Sig Dispense Refill   aspirin 81 MG tablet Take 81 mg by mouth daily.      calcium-vitamin D (OSCAL WITH D) 250-125 MG-UNIT tablet Take 4 tablets by mouth daily. Pt is taking 2 tabs in the am and 2 tabs in the pm     celecoxib (CELEBREX) 200 MG capsule TAKE 1 CAPSULE BY MOUTH EVERY DAY 90 capsule 1   FLUoxetine (PROZAC) 40 MG capsule TAKE 2 CAPSULES (80 MG TOTAL) BY MOUTH EVERY EVENING. 180 capsule 0    Melatonin 10 MG TABS Take 10 mg by mouth at bedtime.     metFORMIN (GLUCOPHAGE) 500 MG tablet TAKE 1/2 TABLET BY MOUTH DAILY WITH BREAKFAST 45 tablet 1   metoprolol succinate (TOPROL-XL) 25 MG 24 hr tablet TAKE  1 TABLET BY MOUTH DAILY. TAKE WITH OR IMMEDIATELY FOLLOWING A MEAL. 90 tablet 3   niacin (NIASPAN) 1000 MG CR tablet TAKE 1 TABLET BY MOUTH TWICE A DAY 180 tablet 2   Probiotic Product (PROBIOTIC DAILY PO) Take 1 capsule by mouth daily.     rosuvastatin (CRESTOR) 20 MG tablet TAKE 1 TABLET BY MOUTH EVERY DAY 90 tablet 1   ramipril (ALTACE) 2.5 MG capsule Take 1 capsule (2.5 mg total) by mouth daily. 90 capsule 3   No current facility-administered medications on file prior to visit.    BP 126/84 (BP Location: Left Arm, Patient Position: Sitting, Cuff Size: Normal)   Pulse 63   Temp 98.5 F (36.9 C) (Oral)   Wt 157 lb (71.2 kg)   SpO2 97%   BMI 26.95 kg/m       Objective:   Physical Exam Vitals and nursing note reviewed.  Constitutional:      Appearance: Normal appearance.  Cardiovascular:     Rate and Rhythm: Normal rate and regular rhythm.     Pulses: Normal pulses.     Heart sounds: Normal heart sounds.  Pulmonary:     Effort: Pulmonary effort is normal.     Breath sounds: Wheezing present.     Comments: Trace wheezing throughout lung fields with expiration.   Skin:    General: Skin is warm and dry.     Capillary Refill: Capillary refill takes less than 2 seconds.  Neurological:     General: No focal deficit present.     Mental Status: She is alert and oriented to person, place, and time.  Psychiatric:        Mood and Affect: Mood normal.        Thought Content: Thought content normal.        Judgment: Judgment normal.        Assessment & Plan:   1. Cough, unspecified type  - POC COVID-19- Negative  - predniSONE (DELTASONE) 10 MG tablet; 40 mg x 3 days, 20 mg x 3 days, 10 mg x 3 days  Dispense: 21 tablet; Refill: 0 - benzonatate (TESSALON) 200 MG  capsule; Take 1 capsule (200 mg total) by mouth 2 (two) times daily as needed for cough.  Dispense: 20 capsule; Refill: 0 - HYDROcodone bit-homatropine (HYCODAN) 5-1.5 MG/5ML syrup; Take 5 mLs by mouth every 8 (eight) hours as needed for cough.  Dispense: 120 mL; Refill: 0 - DG Chest 2 View; Future  Shirline Frees, NP

## 2023-04-09 ENCOUNTER — Encounter: Payer: Self-pay | Admitting: Adult Health

## 2023-04-09 ENCOUNTER — Other Ambulatory Visit: Payer: Self-pay | Admitting: Adult Health

## 2023-04-09 MED ORDER — ALBUTEROL SULFATE HFA 108 (90 BASE) MCG/ACT IN AERS: 2.0000 | INHALATION_SPRAY | Freq: Four times a day (QID) | RESPIRATORY_TRACT | 0 refills | Status: DC | PRN

## 2023-04-09 MED ORDER — GUAIFENESIN-CODEINE 100-10 MG/5ML PO SOLN: 10.0000 mL | Freq: Three times a day (TID) | ORAL | 0 refills | Status: DC | PRN

## 2023-04-09 NOTE — Telephone Encounter (Signed)
Pt called to say the medication is not working and she is coughing uncontrollably.   Pt would like to try something (Anything) else..  Please advise.

## 2023-06-07 ENCOUNTER — Other Ambulatory Visit: Payer: Self-pay | Admitting: Cardiology

## 2023-06-07 DIAGNOSIS — I2510 Atherosclerotic heart disease of native coronary artery without angina pectoris: Secondary | ICD-10-CM

## 2023-06-08 ENCOUNTER — Other Ambulatory Visit: Payer: Self-pay | Admitting: Adult Health

## 2023-06-11 ENCOUNTER — Encounter: Payer: Self-pay | Admitting: Adult Health

## 2023-06-11 ENCOUNTER — Other Ambulatory Visit: Payer: Self-pay | Admitting: Adult Health

## 2023-06-11 DIAGNOSIS — Z1231 Encounter for screening mammogram for malignant neoplasm of breast: Secondary | ICD-10-CM

## 2023-06-12 ENCOUNTER — Other Ambulatory Visit: Payer: Self-pay | Admitting: Adult Health

## 2023-06-30 ENCOUNTER — Other Ambulatory Visit: Payer: Self-pay | Admitting: Family Medicine

## 2023-07-29 ENCOUNTER — Ambulatory Visit
Admission: RE | Admit: 2023-07-29 | Discharge: 2023-07-29 | Disposition: A | Discharge status: Home / Self Care | Payer: Medicare PPO | Source: Ambulatory Visit | Attending: Adult Health

## 2023-07-29 ENCOUNTER — Encounter: Payer: Self-pay | Admitting: Adult Health

## 2023-07-29 DIAGNOSIS — Z1231 Encounter for screening mammogram for malignant neoplasm of breast: Secondary | ICD-10-CM

## 2023-07-30 NOTE — Telephone Encounter (Signed)
Please advise 

## 2023-08-12 ENCOUNTER — Other Ambulatory Visit: Payer: Self-pay | Admitting: Cardiology

## 2023-08-12 DIAGNOSIS — E782 Mixed hyperlipidemia: Secondary | ICD-10-CM

## 2023-10-22 ENCOUNTER — Ambulatory Visit (INDEPENDENT_AMBULATORY_CARE_PROVIDER_SITE_OTHER): Payer: Medicare PPO | Admitting: Adult Health

## 2023-10-22 ENCOUNTER — Encounter: Payer: Self-pay | Admitting: Adult Health

## 2023-10-22 VITALS — BP 130/70 | HR 65 | Temp 98.0°F | Ht 63.75 in | Wt 156.0 lb

## 2023-10-22 DIAGNOSIS — I2510 Atherosclerotic heart disease of native coronary artery without angina pectoris: Secondary | ICD-10-CM

## 2023-10-22 DIAGNOSIS — Z0000 Encounter for general adult medical examination without abnormal findings: Secondary | ICD-10-CM | POA: Diagnosis not present

## 2023-10-22 DIAGNOSIS — G8929 Other chronic pain: Secondary | ICD-10-CM

## 2023-10-22 DIAGNOSIS — I10 Essential (primary) hypertension: Secondary | ICD-10-CM

## 2023-10-22 DIAGNOSIS — M8589 Other specified disorders of bone density and structure, multiple sites: Secondary | ICD-10-CM | POA: Diagnosis not present

## 2023-10-22 DIAGNOSIS — R7303 Prediabetes: Secondary | ICD-10-CM

## 2023-10-22 DIAGNOSIS — F32A Depression, unspecified: Secondary | ICD-10-CM | POA: Diagnosis not present

## 2023-10-22 DIAGNOSIS — F419 Anxiety disorder, unspecified: Secondary | ICD-10-CM

## 2023-10-22 DIAGNOSIS — M545 Low back pain, unspecified: Secondary | ICD-10-CM

## 2023-10-22 LAB — COMPREHENSIVE METABOLIC PANEL
ALT: 13 U/L (ref 0–35)
AST: 21 U/L (ref 0–37)
Albumin: 4.3 g/dL (ref 3.5–5.2)
Alkaline Phosphatase: 50 U/L (ref 39–117)
BUN: 21 mg/dL (ref 6–23)
CO2: 28 meq/L (ref 19–32)
Calcium: 9.2 mg/dL (ref 8.4–10.5)
Chloride: 102 meq/L (ref 96–112)
Creatinine, Ser: 0.97 mg/dL (ref 0.40–1.20)
GFR: 59.38 mL/min — ABNORMAL LOW (ref >60.00)
Glucose, Bld: 108 mg/dL — ABNORMAL HIGH (ref 70–99)
Potassium: 4.2 meq/L (ref 3.5–5.1)
Sodium: 138 meq/L (ref 135–145)
Total Bilirubin: 0.6 mg/dL (ref 0.2–1.2)
Total Protein: 6.5 g/dL (ref 6.0–8.3)

## 2023-10-22 LAB — LIPID PANEL
Cholesterol: 138 mg/dL (ref 0–200)
HDL: 59.9 mg/dL (ref >39.00)
LDL Cholesterol: 63 mg/dL (ref 0–99)
NonHDL: 78.44
Total CHOL/HDL Ratio: 2
Triglycerides: 75 mg/dL (ref 0.0–149.0)
VLDL: 15 mg/dL (ref 0.0–40.0)

## 2023-10-22 LAB — CBC WITH DIFFERENTIAL/PLATELET
Basophils Absolute: 0 10*3/uL (ref 0.0–0.1)
Basophils Relative: 1 % (ref 0.0–3.0)
Eosinophils Absolute: 0.2 10*3/uL (ref 0.0–0.7)
Eosinophils Relative: 4.6 % (ref 0.0–5.0)
HCT: 41.1 % (ref 36.0–46.0)
Hemoglobin: 13.6 g/dL (ref 12.0–15.0)
Lymphocytes Relative: 39.5 % (ref 12.0–46.0)
Lymphs Abs: 1.8 10*3/uL (ref 0.7–4.0)
MCHC: 33 g/dL (ref 30.0–36.0)
MCV: 96.2 fL (ref 78.0–100.0)
Monocytes Absolute: 0.4 10*3/uL (ref 0.1–1.0)
Monocytes Relative: 8.9 % (ref 3.0–12.0)
Neutro Abs: 2.2 10*3/uL (ref 1.4–7.7)
Neutrophils Relative %: 46 % (ref 43.0–77.0)
Platelets: 179 10*3/uL (ref 150.0–400.0)
RBC: 4.28 Mil/uL (ref 3.87–5.11)
RDW: 12.5 % (ref 11.5–15.5)
WBC: 4.7 10*3/uL (ref 4.0–10.5)

## 2023-10-22 LAB — TSH: TSH: 2.59 u[IU]/mL (ref 0.35–5.50)

## 2023-10-22 LAB — VITAMIN D 25 HYDROXY (VIT D DEFICIENCY, FRACTURES): VITD: 46.71 ng/mL (ref 30.00–100.00)

## 2023-10-22 LAB — HEMOGLOBIN A1C: Hgb A1c MFr Bld: 6.2 % (ref 4.6–6.5)

## 2023-10-22 MED ORDER — FLUOXETINE HCL 40 MG PO CAPS: 80.0000 mg | ORAL_CAPSULE | Freq: Every evening | ORAL | 1 refills | Status: DC

## 2023-10-22 NOTE — Patient Instructions (Signed)
It was great seeing you today   We will follow up with you regarding your lab work   Please let me know if you need anything   

## 2023-10-22 NOTE — Progress Notes (Signed)
Subjective:    Patient ID: Monique Miller, female    DOB: [birth year], 72 y.o.   MRN: [id number]  HPI Patient presents for yearly preventative medicine examination. She is a pleasant 72 year old female who  has a past medical history of Allergy, Anemia, Anxiety, Arthritis, Cancer (HCC), Constipation by delayed colonic transit, Coronary artery disease, Depression, Dyspnea, Grover's disease, Headache, migraines, NSTEMI (non-ST elevated myocardial infarction) (HCC) (02/2016), Pneumonia, Restless leg syndrome, and Thyroid disease.  CAD-has a history of heart cath and coronary artery angioplasty in 2017.  She is currently prescribed Crestor 20 mg daily, aspirin 81 mg, metoprolol 25 mg ER.  She is managed by cardiology on a routine basis Lab Results  Component Value Date   CHOL 131 10/18/2022   HDL 63.40 10/18/2022   LDLCALC 57 10/18/2022   LDLDIRECT 115.5 11/09/2011   TRIG 53.0 10/18/2022   CHOLHDL 2 10/18/2022    Hypertension -managed with metoprolol 25 mg ER and ramipril 2.5 mg daily.  She denies dizziness, lightheadedness, chest pain, or shortness of breath BP Readings from Last 3 Encounters:  10/22/23 130/70  04/05/23 126/84  03/20/23 126/70    Anxiety/Depression -managed with Prozac 80 mgdaily.  He does feel controlled on this dose.  Glucose intolerance-managed with metformin 250 mg daily. Lab Results  Component Value Date   HGBA1C 6.3 10/18/2022   HGBA1C 6.2 10/20/2021   HGBA1C 6.2 (H) 08/17/2020    Osteopenia -history of osteopenia.  Her last bone density screen was in 2023 with a T-score of -2.3.  She does take Os-Cal on a daily basis  Chronic Low back pain - takes Celebrex 200 mg PRN   All immunizations and health maintenance protocols were reviewed with the patient and needed orders were placed.  Appropriate screening laboratory values were ordered for the patient including screening of hyperlipidemia, renal function and hepatic function.  Medication reconciliation,   past medical history, social history, problem list and allergies were reviewed in detail with the patient  Goals were established with regard to weight loss, exercise, and  diet in compliance with medications. She is walking several days a week.   She is due for colon cancer screening- she will call her Gastroenterologist. She is up to date on mammograms and bone density screens    Review of Systems  Constitutional: Negative.   HENT: Negative.    Eyes: Negative.   Respiratory: Negative.    Cardiovascular: Negative.   Gastrointestinal: Negative.   Endocrine: Negative.   Genitourinary: Negative.   Musculoskeletal:  Positive for arthralgias and back pain.  Skin: Negative.   Allergic/Immunologic: Negative.   Neurological: Negative.   Hematological: Negative.   Psychiatric/Behavioral: Negative.     Past Medical History:  Diagnosis Date   Allergy    Anemia    "many years ago"   Anxiety    Arthritis    Cancer (HCC)    skin cancer (basal cell)   Constipation by delayed colonic transit    Coronary artery disease    Depression    see [facility] Engineer, production and Colen Darling   Dyspnea    occ   Grover's disease    Headache    Hx of migraines    NSTEMI (non-ST elevated myocardial infarction) (HCC) 02/2016   Pneumonia    walking pneumonia   Restless leg syndrome    Thyroid disease    hx of hypothyroid    Social History   Socioeconomic History   Marital status: Married    Spouse  name: Not on file   Number of children: 2   Years of education: Not on file   Highest education level: Master's degree (e.g., MA, MS, MEng, MEd, MSW, MBA)  Occupational History   Not on file  Tobacco Use   Smoking status: Never   Smokeless tobacco: Never  Vaping Use   Vaping status: Never Used  Substance and Sexual Activity   Alcohol use: Yes    Alcohol/week: 7.0 standard drinks of alcohol    Types: 7 Glasses of wine per week    Comment: occassionally   Drug use: No   Sexual activity: Not on file   Other Topics Concern   Not on file  Social History Narrative   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    Social Determinants of Health   Financial Resource Strain: Low Risk  (07/17/2022)   Overall Financial Resource Strain (CARDIA)    Difficulty of Paying Living Expenses: Not hard at all  Food Insecurity: No Food Insecurity (11/14/2022)   Hunger Vital Sign    Worried About Running Out of Food in the Last Year: Never true    Ran Out of Food in the Last Year: Never true  Transportation Needs: No Transportation Needs (11/14/2022)   PRAPARE - Administrator, Civil Service (Medical): No    Lack of Transportation (Non-Medical): No  Physical Activity: Insufficiently Active (11/14/2022)   Exercise Vital Sign    Days of Exercise per Week: 1 day    Minutes of Exercise per Session: 20 min  Stress: No Stress Concern Present (11/14/2022)   [employer] of Occupational Health - Occupational Stress Questionnaire    Feeling of Stress : Not at all  Social Connections: Unknown (11/14/2022)   Social Connection and Isolation Panel [NHANES]    Frequency of Communication with Friends and Family: More than three times a week    Frequency of Social Gatherings with Friends and Family: Once a week    Attends Religious Services: Patient declined    Database administrator or Organizations: Yes    Attends Banker Meetings: More than 4 times per year    Marital Status: Married  Catering manager Violence: Unknown (02/22/2022)   Received from [employer], [facility]   HITS    Physically Hurt: Not on file    Insult or Talk Down To: Not on file    Threaten Physical Harm: Not on file    Scream or Curse: Not on file    Past Surgical History:  Procedure Laterality Date   APPENDECTOMY     AUGMENTATION  MAMMAPLASTY Bilateral    Patient doesn't remember date   BACK SURGERY     L4-L5   BREAST SURGERY     augmentation   CARDIAC CATHETERIZATION N/A 03/08/2016   Procedure: Left Heart Cath and Coronary Angiography;  Surgeon: Yates Decamp, MD;  Location[facility] INVASIVE CV LAB;  Service: Cardiovascular;  Laterality: N/A;   CARDIAC CATHETERIZATION N/A 03/08/2016   Procedure: Coronary Stent Intervention;  Surgeon: Yates Decamp, MD;  Location: [facility] INVASIVE CV LAB;  Service: Cardiovascular;  Laterality: N/A;  Resolute 3.5x15   CARDIAC CATHETERIZATION N/A 03/08/2016   Procedure: Intravascular Ultrasound/IVUS;  Surgeon: Vonna Kotyk  Jacinto Halim, MD;  Location: [facility] INVASIVE CV LAB;  Service: Cardiovascular;  Laterality: N/A;  RCA MID   CESAREAN SECTION     x2   COLONOSCOPY     CORONARY ANGIOGRAM  03/08/2016   CORONARY ANGIOPLASTY     DES LAD, DES D1 02/16/16 Cristy Friedlander, [place]), DES RCA 03/08/16 [facility])   LAMINECTOMY WITH POSTERIOR LATERAL ARTHRODESIS LEVEL 2 N/A 03/07/2017   Procedure: Lumbar Four-Five, Lumbar Five-Sacral One Posterior Lateral Fusion with Removal of pedicle screws;  Surgeon: Tia Alert, MD;  [facility];  Service: Neurosurgery;  Laterality: N/A;    Family History  Problem Relation Age of Onset   Cancer Mother        pancreatic   Congestive Heart Failure Father    Heart attack Brother        Had blockage, almost a HA   Heart disease Brother    Cancer Maternal Uncle        pancreatic   Cancer Maternal Grandmother        pancreatic   Breast cancer Neg Hx     No Known Allergies  Current Outpatient Medications on File Prior to Visit  Medication Sig Dispense Refill   aspirin 81 MG tablet Take 81 mg by mouth daily.      calcium-vitamin D (OSCAL WITH D) 250-125 MG-UNIT tablet Take 4 tablets by mouth daily. Pt is taking 2 tabs in the am and 2 tabs in the pm     celecoxib (CELEBREX) 200 MG capsule TAKE 1 CAPSULE BY MOUTH EVERY DAY 90 capsule 1   FLUoxetine (PROZAC) 40 MG capsule TAKE 2 CAPSULES (80 MG  TOTAL) BY MOUTH EVERY EVENING. 180 capsule 0   Melatonin 10 MG TABS Take 10 mg by mouth at bedtime.     metFORMIN (GLUCOPHAGE) 500 MG tablet TAKE 1/2 TABLET BY MOUTH DAILY WITH BREAKFAST 45 tablet 1   metoprolol succinate (TOPROL-XL) 25 MG 24 hr tablet TAKE 1 TABLET BY MOUTH EVERY DAY WITH OR IMMEDIATELY FOLLOWING A MEAL 90 tablet 3   niacin (NIASPAN) 1000 MG CR tablet TAKE 1 TABLET BY MOUTH TWICE A DAY 180 tablet 2   Probiotic Product (PROBIOTIC DAILY PO) Take 1 capsule by mouth daily.     ramipril (ALTACE) 2.5 MG capsule Take 1 capsule (2.5 mg total) by mouth daily. 90 capsule 3   rosuvastatin (CRESTOR) 20 MG tablet TAKE 1 TABLET BY MOUTH EVERY DAY 90 tablet 1   No current facility-administered medications on file prior to visit.    BP 130/70   Pulse 65   Temp 98 F (36.7 C) (Oral)   Ht 5' 3.75" (1.619 m)   Wt 156 lb (70.8 kg)   SpO2 96%   BMI 26.99 kg/m       Objective:   Physical Exam Vitals and nursing note reviewed.  Constitutional:      General: She is not in acute distress.    Appearance: Normal appearance. She is well-developed. She is not ill-appearing.  HENT:     Head: Normocephalic and atraumatic.     Right Ear: Tympanic membrane, ear canal and external ear normal. There is no impacted cerumen.     Left Ear: Tympanic membrane, ear canal and external ear normal. There is no impacted cerumen.     Nose: Nose normal. No congestion or rhinorrhea.     Mouth/Throat:     Mouth: Mucous membranes are moist.     Pharynx: Oropharynx is clear. No oropharyngeal exudate or posterior oropharyngeal  erythema.  Eyes:     General:        Right eye: No discharge.        Left eye: No discharge.     Extraocular Movements: Extraocular movements intact.     Conjunctiva/sclera: Conjunctivae normal.     Pupils: Pupils are equal, round, and reactive to light.  Neck:     Thyroid: No thyromegaly.     Vascular: No carotid bruit.     Trachea: No tracheal deviation.  Cardiovascular:      Rate and Rhythm: Normal rate and regular rhythm.     Pulses: Normal pulses.     Heart sounds: Normal heart sounds. No murmur heard.    No friction rub. No gallop.  Pulmonary:     Effort: Pulmonary effort is normal. No respiratory distress.     Breath sounds: Normal breath sounds. No stridor. No wheezing, rhonchi or rales.  Chest:     Chest wall: No tenderness.  Abdominal:     General: Abdomen is flat. Bowel sounds are normal. There is no distension.     Palpations: Abdomen is soft. There is no mass.     Tenderness: There is no abdominal tenderness. There is no right CVA tenderness, left CVA tenderness, guarding or rebound.     Hernia: No hernia is present.  Musculoskeletal:        General: No swelling, tenderness, deformity or signs of injury. Normal range of motion.     Cervical back: Normal range of motion and neck supple.     Right lower leg: No edema.     Left lower leg: No edema.  Lymphadenopathy:     Cervical: No cervical adenopathy.  Skin:    General: Skin is warm and dry.     Coloration: Skin is not jaundiced or pale.     Findings: No bruising, erythema, lesion or rash.  Neurological:     General: No focal deficit present.     Mental Status: She is alert and oriented to person, place, and time.     Cranial Nerves: No cranial nerve deficit.     Sensory: No sensory deficit.     Motor: No weakness.     Coordination: Coordination normal.     Gait: Gait normal.     Deep Tendon Reflexes: Reflexes normal.  Psychiatric:        Mood and Affect: Mood normal.        Behavior: Behavior normal.        Thought Content: Thought content normal.        Judgment: Judgment normal.       Assessment & Plan:  1. Routine general medical examination at a health care facility Today patient counseled on age appropriate routine health concerns for screening and prevention, each reviewed and up to date or declined. Immunizations reviewed and up to date or declined. Labs ordered and reviewed.  Risk factors for depression reviewed and negative. Hearing function and visual acuity are intact. ADLs screened and addressed as needed. Functional ability and level of safety reviewed and appropriate. Education, counseling and referrals performed based on assessed risks today. Patient provided with a copy of personalized plan for preventive services. - Continue to exercise and eat healthy  - Follow up in one year or sooner if needed  2. Coronary artery disease involving native coronary artery of native heart without angina pectoris - Per Cardiology  - CBC with Differential/Platelet; Future - Comprehensive metabolic panel; Future - Lipid panel; Future -  TSH; Future  3. Primary hypertension - Well controlled. No change in medication  - CBC with Differential/Platelet; Future - Comprehensive metabolic panel; Future - Lipid panel; Future - TSH; Future  4. Anxiety and depression - Continue with Prozac - CBC with Differential/Platelet; Future - Comprehensive metabolic panel; Future - Lipid panel; Future - TSH; Future  5. Pre-diabetes - Consider increase in metformin  - CBC with Differential/Platelet; Future - Comprehensive metabolic panel; Future - Lipid panel; Future - TSH; Future - Hemoglobin A1c; Future  6. Osteopenia of multiple sites - Continue with [place] and exercise  - CBC with Differential/Platelet; Future - Comprehensive metabolic panel; Future - Lipid panel; Future - TSH; Future - VITAMIN D 25 Hydroxy (Vit-D Deficiency, Fractures); Future  7. Chronic midline low back pain without sciatica - Continue with Celebrex   Shirline Frees, NP

## 2023-10-24 ENCOUNTER — Telehealth: Payer: Self-pay | Admitting: Cardiology

## 2023-10-24 ENCOUNTER — Other Ambulatory Visit: Payer: Self-pay | Admitting: Cardiology

## 2023-10-24 DIAGNOSIS — I2510 Atherosclerotic heart disease of native coronary artery without angina pectoris: Secondary | ICD-10-CM

## 2023-10-24 DIAGNOSIS — L600 Ingrowing nail: Secondary | ICD-10-CM | POA: Diagnosis not present

## 2023-10-24 MED ORDER — RAMIPRIL 2.5 MG PO CAPS: 2.5000 mg | ORAL_CAPSULE | Freq: Every day | ORAL | Status: DC

## 2023-10-24 NOTE — Telephone Encounter (Signed)
Refill faxed to the pharmacy

## 2023-10-24 NOTE — Telephone Encounter (Signed)
*  STAT* If patient is at the pharmacy, call can be transferred to refill team.   1. Which medications need to be refilled? (please list name of each medication and dose if known)   ramipril (ALTACE) 2.5 MG capsule     2. Would you like to learn more about the convenience, safety, & potential cost savings by using the [facility]? No   3. Are you open to using the [facility] (Type [facility].) No   4. Which pharmacy/location (including street and city if local pharmacy) is medication to be sent to?[id number] - [place], [place] - [address]     5. Do they need a 30 day or 90 day supply? 90 day

## 2023-10-29 ENCOUNTER — Encounter: Payer: Self-pay | Admitting: Cardiology

## 2023-10-29 DIAGNOSIS — I2510 Atherosclerotic heart disease of native coronary artery without angina pectoris: Secondary | ICD-10-CM

## 2023-10-29 MED ORDER — RAMIPRIL 2.5 MG PO CAPS: 2.5000 mg | ORAL_CAPSULE | Freq: Every day | ORAL | 2 refills | Status: DC

## 2023-12-03 DIAGNOSIS — L03032 Cellulitis of left toe: Secondary | ICD-10-CM | POA: Diagnosis not present

## 2023-12-03 DIAGNOSIS — L600 Ingrowing nail: Secondary | ICD-10-CM | POA: Diagnosis not present

## 2023-12-05 ENCOUNTER — Other Ambulatory Visit: Payer: Self-pay | Admitting: Adult Health

## 2023-12-08 ENCOUNTER — Other Ambulatory Visit: Payer: Self-pay | Admitting: Adult Health

## 2023-12-11 ENCOUNTER — Ambulatory Visit: Payer: Medicare PPO | Admitting: Adult Health

## 2023-12-11 VITALS — BP 100/60 | HR 58 | Temp 97.9°F | Ht 63.75 in | Wt 162.0 lb

## 2023-12-11 DIAGNOSIS — J0300 Acute streptococcal tonsillitis, unspecified: Secondary | ICD-10-CM

## 2023-12-11 MED ORDER — METHYLPREDNISOLONE 4 MG PO TBPK: ORAL_TABLET | ORAL | 0 refills | Status: DC

## 2023-12-11 MED ORDER — AMOXICILLIN 500 MG PO CAPS: 500.0000 mg | ORAL_CAPSULE | Freq: Two times a day (BID) | ORAL | 0 refills | Status: AC

## 2023-12-11 NOTE — Progress Notes (Unsigned)
Subjective:    Patient ID: Monique Miller, female    DOB: 1951/04/03, 73 y.o.   MRN: [id number]  HPI  73 year old female who  has a past medical history of Allergy, Anemia, Anxiety, Arthritis, Cancer (HCC), Constipation by delayed colonic transit, Coronary artery disease, Depression, Dyspnea, Grover's disease, Headache, migraines, NSTEMI (non-ST elevated myocardial infarction) (HCC) (02/2016), Pneumonia, Restless leg syndrome, and Thyroid disease.  She presents to the office today for an acute issue. She reports that over the last 5 days. Her symptoms started with sore throat and progressed over the last few days into body aches, dry cough, headache and body aches.   She denies SOB, fever, or ear pain.   Review of Systems See HPI   Past Medical History:  Diagnosis Date   Allergy    Anemia    "many years ago"   Anxiety    Arthritis    Cancer (HCC)    skin cancer (basal cell)   Constipation by delayed colonic transit    Coronary artery disease    Depression    see [facility] Engineer, production and Colen Darling   Dyspnea    occ   Grover's disease    Headache    Hx of migraines    NSTEMI (non-ST elevated myocardial infarction) (HCC) 02/2016   Pneumonia    walking pneumonia   Restless leg syndrome    Thyroid disease    hx of hypothyroid    Social History   Socioeconomic History   Marital status: Married    Spouse name: Not on file   Number of children: 2   Years of education: Not on file   Highest education level: Master's degree (e.g., MA, MS, MEng, MEd, MSW, MBA)  Occupational History   Not on file  Tobacco Use   Smoking status: Never   Smokeless tobacco: Never  Vaping Use   Vaping status: Never Used  Substance and Sexual Activity   Alcohol use: Yes    Alcohol/week: 7.0 standard drinks of alcohol    Types: 7 Glasses of wine per week    Comment: occassionally   Drug use: No   Sexual activity: Not on file  Other Topics Concern   Not on file  Social History Narrative    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    Social Drivers of Corporate investment banker Strain: Low Risk  (12/11/2023)   Overall Financial Resource Strain (CARDIA)    Difficulty of Paying Living Expenses: Not hard at all  Food Insecurity: No Food Insecurity (12/11/2023)   Hunger Vital Sign    Worried About Running Out of Food in the Last Year: Never true    Ran Out of Food in the Last Year: Never true  Transportation Needs: No Transportation Needs (12/11/2023)   PRAPARE - Administrator, Civil Service (Medical): No    Lack of Transportation (Non-Medical): No  Physical Activity: Sufficiently Active (12/11/2023)   Exercise Vital Sign    Days of Exercise per Week: 3 days    Minutes of Exercise per Session: 50 min  Stress: No Stress Concern Present (12/11/2023)   [employer] of Occupational Health - Occupational Stress Questionnaire  Feeling of Stress : Only a little  Social Connections: Moderately Integrated (12/11/2023)   Social Connection and Isolation Panel [NHANES]    Frequency of Communication with Friends and Family: More than three times a week    Frequency of Social Gatherings with Friends and Family: Twice a week    Attends Religious Services: Never    Database administrator or Organizations: Yes    Attends Engineer, structural: More than 4 times per year    Marital Status: Married  Catering manager Violence: Not At Risk (10/23/2023)   Received from [facility]   HITS    Over the last 12 months how often did your partner physically hurt you?: Never    Over the last 12 months how often did your partner insult you or talk down to you?: Never    Over the last 12 months how often did your partner threaten you with physical harm?: Never    Over the last 12 months how often did  your partner scream or curse at you?: Never    Past Surgical History:  Procedure Laterality Date   APPENDECTOMY     AUGMENTATION MAMMAPLASTY Bilateral    Patient doesn't remember date   BACK SURGERY     L4-L5   BREAST SURGERY     augmentation   CARDIAC CATHETERIZATION N/A 03/08/2016   Procedure: Left Heart Cath and Coronary Angiography;  Surgeon: Yates Decamp, MD;  Location: [facility] INVASIVE CV LAB;  Service: Cardiovascular;  Laterality: N/A;   CARDIAC CATHETERIZATION N/A 03/08/2016   Procedure: Coronary Stent Intervention;  Surgeon: Yates Decamp, MD;  Location: [facility] INVASIVE CV LAB;  Service: Cardiovascular;  Laterality: N/A;  Resolute 3.5x15   CARDIAC CATHETERIZATION N/A 03/08/2016   Procedure: Intravascular Ultrasound/IVUS;  Surgeon: Yates Decamp, MD;  Location: [facility] INVASIVE CV LAB;  Service: Cardiovascular;  Laterality: N/A;  RCA MID   CESAREAN SECTION     x2   COLONOSCOPY     CORONARY ANGIOGRAM  03/08/2016   CORONARY ANGIOPLASTY     DES LAD, DES D1 02/16/16 Cristy Friedlander, [place]), DES RCA 03/08/16 [facility])   LAMINECTOMY WITH POSTERIOR LATERAL ARTHRODESIS LEVEL 2 N/A 03/07/2017   Procedure: Lumbar Four-Five, Lumbar Five-Sacral One Posterior Lateral Fusion with Removal of pedicle screws;  Surgeon: Tia Alert, MD;  Location: [facility] OR;  Service: Neurosurgery;  Laterality: N/A;    Family History  Problem Relation Age of Onset   Cancer Mother        pancreatic   Congestive Heart Failure Father    Heart attack Brother        Had blockage, almost a HA   Heart disease Brother    Cancer Maternal Uncle        pancreatic   Cancer Maternal Grandmother        pancreatic   Breast cancer Neg Hx     No Known Allergies  Current Outpatient Medications on File Prior to Visit  Medication Sig Dispense Refill   aspirin 81 MG tablet Take 81 mg by mouth daily.      calcium-vitamin D (OSCAL WITH D) 250-125 MG-UNIT tablet Take 4 tablets by mouth daily. Pt is taking 2 tabs in the am and 2 tabs in the pm     celecoxib  (CELEBREX) 200 MG capsule TAKE 1 CAPSULE BY MOUTH EVERY DAY 90 capsule 1   FLUoxetine (PROZAC) 40 MG capsule Take 2 capsules (80 mg total) by mouth every evening. 180 capsule 1   Melatonin 10 MG  TABS Take 10 mg by mouth at bedtime.     metFORMIN (GLUCOPHAGE) 500 MG tablet TAKE 1/2 TABLET BY MOUTH DAILY WITH BREAKFAST 45 tablet 1   metoprolol succinate (TOPROL-XL) 25 MG 24 hr tablet TAKE 1 TABLET BY MOUTH EVERY DAY WITH OR IMMEDIATELY FOLLOWING A MEAL 90 tablet 3   niacin (NIASPAN) 1000 MG CR tablet TAKE 1 TABLET BY MOUTH TWICE A DAY 180 tablet 2   Probiotic Product (PROBIOTIC DAILY PO) Take 1 capsule by mouth daily.     ramipril (ALTACE) 2.5 MG capsule Take 1 capsule (2.5 mg total) by mouth daily. 90 capsule 2   rosuvastatin (CRESTOR) 20 MG tablet TAKE 1 TABLET BY MOUTH EVERY DAY 90 tablet 1   No current facility-administered medications on file prior to visit.    BP 100/60   Pulse (!) 58   Temp 97.9 F (36.6 C) (Oral)   Ht 5' 3.75" (1.619 m)   Wt 162 lb (73.5 kg)   SpO2 98%   BMI 28.03 kg/m       Objective:   Physical Exam Vitals and nursing note reviewed.  Constitutional:      Appearance: Normal appearance.  HENT:     Mouth/Throat:     Mouth: Mucous membranes are moist.     Pharynx: Posterior oropharyngeal erythema present.     Tonsils: Tonsillar exudate present. No tonsillar abscesses. 1+ on the right. 1+ on the left.  Cardiovascular:     Rate and Rhythm: Regular rhythm.     Pulses: Normal pulses.     Heart sounds: Normal heart sounds.  Pulmonary:     Effort: Pulmonary effort is normal.     Breath sounds: Normal breath sounds.  Musculoskeletal:        General: Normal range of motion.  Skin:    General: Skin is warm and dry.     Capillary Refill: Capillary refill takes less than 2 seconds.  Neurological:     General: No focal deficit present.     Mental Status: She is alert and oriented to person, place, and time.  Psychiatric:        Mood and Affect: Mood normal.         Behavior: Behavior normal.        Thought Content: Thought content normal.        Judgment: Judgment normal.        Assessment & Plan:  1. Acute non-recurrent streptococcal tonsillitis (Primary)  - POC COVID-19 BinaxNow- Negative - POCT rapid strep A- Positive - POCT Influenza A/B- Negative - amoxicillin (AMOXIL) 500 MG capsule; Take 1 capsule (500 mg total) by mouth 2 (two) times daily for 10 days.  Dispense: 20 capsule; Refill: 0 - methylPREDNISolone (MEDROL DOSEPAK) 4 MG TBPK tablet; Take as directed  Dispense: 21 tablet; Refill: 0 - Advised warm saltwater gargles and warm liquids.  - Follow up if not resolving in the next 3-4 days   Shirline Frees, NP

## 2023-12-12 LAB — POC COVID19 BINAXNOW: SARS Coronavirus 2 Ag: NEGATIVE

## 2023-12-12 LAB — POCT RAPID STREP A (OFFICE): Rapid Strep A Screen: POSITIVE — AB

## 2023-12-12 LAB — POCT INFLUENZA A/B
Influenza A, POC: NEGATIVE
Influenza B, POC: NEGATIVE

## 2023-12-13 ENCOUNTER — Encounter: Payer: Self-pay | Admitting: Adult Health

## 2023-12-13 ENCOUNTER — Telehealth: Payer: Self-pay

## 2023-12-13 ENCOUNTER — Other Ambulatory Visit: Payer: Self-pay | Admitting: Adult Health

## 2023-12-13 MED ORDER — BENZONATATE 200 MG PO CAPS
200.0000 mg | ORAL_CAPSULE | Freq: Three times a day (TID) | ORAL | 0 refills | Status: DC | PRN
Start: 2023-12-13 — End: 2024-03-02

## 2023-12-13 NOTE — Telephone Encounter (Signed)
Copied from CRM [id number]. Topic: General - Other >> Dec 13, 2023 10:11 AM Truddie Crumble wrote: Reason for CRM: patent called stating she sent a message through [facility] and want the provider to read it

## 2023-12-25 ENCOUNTER — Encounter: Payer: Self-pay | Admitting: Family Medicine

## 2023-12-26 ENCOUNTER — Telehealth: Payer: Self-pay | Admitting: Adult Health

## 2023-12-26 ENCOUNTER — Encounter (INDEPENDENT_AMBULATORY_CARE_PROVIDER_SITE_OTHER): Payer: Medicare PPO | Admitting: Family Medicine

## 2023-12-26 NOTE — Progress Notes (Signed)
error 

## 2023-12-26 NOTE — Telephone Encounter (Signed)
Copied from CRM [id number]. Topic: General - Other >> Dec 26, 2023  8:35 AM Theodis Sato wrote: Reason for CRM: **Message for Monique Miller** Patient is calling to inform you that she has to cancel her AWV with you today 2/13 because of her brother being in the hospital. Patient is requesting you to call her back.

## 2024-01-14 DIAGNOSIS — I2510 Atherosclerotic heart disease of native coronary artery without angina pectoris: Secondary | ICD-10-CM | POA: Diagnosis not present

## 2024-01-14 DIAGNOSIS — Z1211 Encounter for screening for malignant neoplasm of colon: Secondary | ICD-10-CM | POA: Diagnosis not present

## 2024-01-14 DIAGNOSIS — I509 Heart failure, unspecified: Secondary | ICD-10-CM | POA: Diagnosis not present

## 2024-01-14 DIAGNOSIS — I252 Old myocardial infarction: Secondary | ICD-10-CM | POA: Diagnosis not present

## 2024-01-14 LAB — HM COLONOSCOPY

## 2024-01-21 DIAGNOSIS — C4402 Squamous cell carcinoma of skin of lip: Secondary | ICD-10-CM | POA: Diagnosis not present

## 2024-01-21 DIAGNOSIS — D485 Neoplasm of uncertain behavior of skin: Secondary | ICD-10-CM | POA: Diagnosis not present

## 2024-01-28 DIAGNOSIS — C4402 Squamous cell carcinoma of skin of lip: Secondary | ICD-10-CM | POA: Diagnosis not present

## 2024-02-18 DIAGNOSIS — L814 Other melanin hyperpigmentation: Secondary | ICD-10-CM | POA: Diagnosis not present

## 2024-02-18 DIAGNOSIS — Z85828 Personal history of other malignant neoplasm of skin: Secondary | ICD-10-CM | POA: Diagnosis not present

## 2024-02-18 DIAGNOSIS — L570 Actinic keratosis: Secondary | ICD-10-CM | POA: Diagnosis not present

## 2024-02-18 DIAGNOSIS — D485 Neoplasm of uncertain behavior of skin: Secondary | ICD-10-CM | POA: Diagnosis not present

## 2024-02-18 DIAGNOSIS — L821 Other seborrheic keratosis: Secondary | ICD-10-CM | POA: Diagnosis not present

## 2024-02-18 DIAGNOSIS — D225 Melanocytic nevi of trunk: Secondary | ICD-10-CM | POA: Diagnosis not present

## 2024-02-18 DIAGNOSIS — L578 Other skin changes due to chronic exposure to nonionizing radiation: Secondary | ICD-10-CM | POA: Diagnosis not present

## 2024-02-21 ENCOUNTER — Other Ambulatory Visit: Payer: Self-pay | Admitting: Cardiology

## 2024-02-21 DIAGNOSIS — E7841 Elevated Lipoprotein(a): Secondary | ICD-10-CM

## 2024-02-21 DIAGNOSIS — I2510 Atherosclerotic heart disease of native coronary artery without angina pectoris: Secondary | ICD-10-CM

## 2024-02-26 ENCOUNTER — Ambulatory Visit: Payer: Self-pay

## 2024-02-26 NOTE — Telephone Encounter (Signed)
 Noted.

## 2024-02-26 NOTE — Telephone Encounter (Signed)
  Chief Complaint: fell-left rib pain Symptoms: pain  Disposition: [] ED /[] Urgent Care (no appt availability in office) / [x] Appointment(In office/virtual)/ []  [facility]/ [] Home Care/ [] Refused Recommended Disposition /[] [place] [facility]/ []  Follow-up with PCP Additional Notes: Pt fell last Friday 4/11 while walking to nail salon. Pt isn't sure how the fall occurred. Pt stated, "I landed pretty hard on left side. I didn't hit head. I had no cuts/bruises. I was able to get myself up and carry on with my day. Im just still sore under left breast. Hurts to lay on left side." No visible bruising/swelling. Just tender to touch and take deep breaths. Pain is rated mild. Pt is taking OTC Tylenol Extra Strength when needed. Pt is out of town in [place] and can't be seen until Monday, Pt requested female provider.  Pt has appt 4/21 @0830  with Dr Salomon Fick. Rn explained to seek care at an urgent care while out of town if symptoms worsen. RN gave advice and pt verbalized understanding.              Copied from CRM [id number]. Topic: Clinical - Red Word Triage >> Feb 26, 2024  8:43 AM Mackie Pai E wrote: [facility] that prompted transfer to Nurse Triage: Patient fell Friday 4/11 and is now having pain under her left breast. Patient stated the pain comes and goes but is just very uncomfortable. Reason for Disposition  MILD weakness (i.e., does not interfere with ability to work, go to school, normal activities)  (Exception: Mild weakness is a chronic symptom.)  Answer Assessment - Initial Assessment Questions 1. MECHANISM: "How did the fall happen?"     Walking and tripped 2. DOMESTIC VIOLENCE AND ELDER ABUSE SCREENING: "Did you fall because someone pushed you or tried to hurt you?" If Yes, ask: "Are you safe now?"     na 3. ONSET: "When did the fall happen?" (e.g., minutes, hours, or days ago)     4/11 4. LOCATION: "What part of the body hit the ground?" (e.g., back, buttocks,  head, hips, knees, hands, head, stomach)     Whole left side of body 5. INJURY: "Did you hurt (injure) yourself when you fell?" If Yes, ask: "What did you injure? Tell me more about this?" (e.g., body area; type of injury; pain severity)"     denies 6. PAIN: "Is there any pain?" If Yes, ask: "How bad is the pain?" (e.g., Scale 1-10; or mild,  moderate, severe)   - NONE (0): No pain   - MILD (1-3): Doesn't interfere with normal activities    - MODERATE (4-7): Interferes with normal activities or awakens from sleep    - SEVERE (8-10): Excruciating pain, unable to do any normal activities      mild 7. SIZE: For cuts, bruises, or swelling, ask: "How large is it?" (e.g., inches or centimeters)      denies  9. OTHER SYMPTOMS: "Do you have any other symptoms?" (e.g., dizziness, fever, weakness; new onset or worsening).      na 10. CAUSE: "What do you think caused the fall (or falling)?" (e.g., tripped, dizzy spell)       Not sure  Protocols used: Falls and [facility]

## 2024-03-02 ENCOUNTER — Ambulatory Visit: Admitting: Family Medicine

## 2024-03-02 ENCOUNTER — Encounter: Payer: Self-pay | Admitting: Family Medicine

## 2024-03-02 VITALS — BP 130/80 | HR 64 | Temp 98.6°F | Wt 160.6 lb

## 2024-03-02 DIAGNOSIS — W19XXXA Unspecified fall, initial encounter: Secondary | ICD-10-CM | POA: Diagnosis not present

## 2024-03-02 DIAGNOSIS — J302 Other seasonal allergic rhinitis: Secondary | ICD-10-CM

## 2024-03-02 DIAGNOSIS — R0789 Other chest pain: Secondary | ICD-10-CM | POA: Diagnosis not present

## 2024-03-02 DIAGNOSIS — S20212A Contusion of left front wall of thorax, initial encounter: Secondary | ICD-10-CM | POA: Diagnosis not present

## 2024-03-02 NOTE — Progress Notes (Signed)
 Established Patient Office Visit   Subjective  Patient ID: Monique Miller, female    DOB: 03-07-1951  Age: 73 y.o. MRN: [id number]  Chief Complaint  Patient presents with   Rib Injury    Left upper   Nasal Congestion    Patient is a 73 year old female followed by Alto Atta, NP and seen for acute concerns.  On 02/21/24 patient tripped on sidewalk landing on her left side.  Patient endorses soreness underneath left breast, worse with deep breathing.  Denies bruising, deformities.  Tried Tylenol .  Started feeling a little better last night.  Patient also endorses nasal congestion, cough, rhinorrhea, irritated throat x 1 week.  Cough worse at night.  Denies fever, chills, facial pain/pressure, ear pain/pressure, sick contacts.  Patient tried OTC allergy med last week for symptoms.    Patient Active Problem List   Diagnosis Date Noted   S/P lumbar spinal fusion 03/07/2017   Osteopenia 03/14/2016   Post PTCA 03/08/2016   CAD (coronary artery disease), native coronary artery 03/05/2016   Spinal stenosis of lumbar region at multiple levels 08/06/2014   Anxiety 10/02/2012   Depression 10/02/2012   Menopausal disorder 10/02/2012   Attention deficit disorder 10/02/2012   Past Medical History:  Diagnosis Date   Allergy    Anemia    "many years ago"   Anxiety    Arthritis    Cancer (HCC)    skin cancer (basal cell)   Constipation by delayed colonic transit    Coronary artery disease    Depression    see [employer], production and [employer]   Dyspnea    occ   Grover's disease    Headache    Hx of migraines    NSTEMI (non-ST elevated myocardial infarction) (HCC) 02/2016   Pneumonia    walking pneumonia   Restless leg syndrome    Thyroid  disease    hx of hypothyroid   Past Surgical History:  Procedure Laterality Date   APPENDECTOMY     AUGMENTATION MAMMAPLASTY Bilateral    Patient doesn't remember date   BACK SURGERY     L4-L5   BREAST SURGERY     augmentation   CARDIAC  CATHETERIZATION N/A 03/08/2016   Procedure: Left Heart Cath and Coronary Angiography;  Surgeon: Knox Perl, MD;  [facility] CV LAB;  Service: Cardiovascular;  Laterality: N/A;   CARDIAC CATHETERIZATION N/A 03/08/2016   Procedure: Coronary Stent Intervention;  Surgeon: Knox Perl, MD;  Location: [facility] INVASIVE CV LAB;  Service: Cardiovascular;  Laterality: N/A;  Resolute 3.5x15   CARDIAC CATHETERIZATION N/A 03/08/2016   Procedure: Intravascular Ultrasound/IVUS;  Surgeon: Knox Perl, MD;  Location: [facility] INVASIVE CV LAB;  Service: Cardiovascular;  Laterality: N/A;  RCA MID   CESAREAN SECTION     x2   COLONOSCOPY     CORONARY ANGIOGRAM  03/08/2016   CORONARY ANGIOPLASTY     DES LAD, DES D1 02/16/16 Roslyn Coombe, [place]), DES RCA 03/08/16 [facility])   LAMINECTOMY WITH POSTERIOR LATERAL ARTHRODESIS LEVEL 2 N/A 03/07/2017   Procedure: Lumbar Four-Five, Lumbar Five-Sacral One Posterior Lateral Fusion with Removal of pedicle screws;  Surgeon: Isadora Mar, MD;  Location: [facility] OR;  Service: Neurosurgery;  Laterality: N/A;   Social History   Tobacco Use   Smoking status: Never   Smokeless tobacco: Never  Vaping Use   Vaping status: Never Used  Substance Use Topics   Alcohol use: Yes    Alcohol/week: 7.0 standard drinks of alcohol    Types: 7  Glasses of wine per week    Comment: occassionally   Drug use: No   Family History  Problem Relation Age of Onset   Cancer Mother        pancreatic   Congestive Heart Failure Father    Heart attack Brother        Had blockage, almost a HA   Heart disease Brother    Cancer Maternal Uncle        pancreatic   Cancer Maternal Grandmother        pancreatic   Breast cancer Neg Hx    No Known Allergies    ROS Negative unless stated above    Objective:     BP 130/80 (BP Location: Left Arm, Patient Position: Sitting, Cuff Size: Normal)   Pulse 64   Temp 98.6 F (37 C) (Oral)   Wt 160 lb 9.6 oz (72.8 kg)   SpO2 99%   BMI 27.78 kg/m  BP Readings from  Last 3 Encounters:  03/02/24 130/80  12/11/23 100/60  10/22/23 130/70   Wt Readings from Last 3 Encounters:  03/02/24 160 lb 9.6 oz (72.8 kg)  12/11/23 162 lb (73.5 kg)  10/22/23 156 lb (70.8 kg)      Physical Exam Constitutional:      General: She is not in acute distress.    Appearance: Normal appearance.     Comments: Seated comfortably.  On RA.  Moves to exam table with ease.  Able to breathe in deeply without visible discomfort.  HENT:     Head: Normocephalic and atraumatic.     Right Ear: Tympanic membrane and ear canal normal.     Left Ear: Tympanic membrane and ear canal normal.     Ears:     Comments: Bilateral hearing aids in place.    Nose: Nose normal.     Left Turbinates: Enlarged and swollen.     Right Sinus: No maxillary sinus tenderness or frontal sinus tenderness.     Left Sinus: No maxillary sinus tenderness or frontal sinus tenderness.     Mouth/Throat:     Mouth: Mucous membranes are moist.     Pharynx: Postnasal drip present.  Cardiovascular:     Rate and Rhythm: Normal rate and regular rhythm.     Heart sounds: Normal heart sounds. No murmur heard.    No gallop.  Pulmonary:     Effort: Pulmonary effort is normal. No respiratory distress.     Breath sounds: Normal breath sounds. No wheezing, rhonchi or rales.  Chest:     Chest wall: Tenderness present. No deformity or crepitus.       Comments: TTP with mild edema underneath left breast midclavicular line.  Prominent lower rib cage bilaterally with mild swelling on left.  No deformities, bruising, erythema, or abrasions. Musculoskeletal:     Comments: TTP of L lower rib cage midclavicular line underneath L breast.  No increased pain with vibration from tuning fork on ribs.  Skin:    General: Skin is warm and dry.  Neurological:     Mental Status: She is alert and oriented to person, place, and time.  Psychiatric:        Behavior: Behavior is cooperative.     No results found for any visits on  03/02/24.    Assessment & Plan:  Chest wall pain  Contusion of rib on left side, initial encounter  Fall from standing, initial encounter  Seasonal allergic rhinitis, unspecified trigger  Acute left lower lip  cage pain status post fall likely due to rib contusion.  Fracture less likely though still on DDx.  Discussed supportive care including heat, topical analgesics, Tylenol  or NSAIDs.  Patient encouraged to take deep breaths intermittently to avoid atelectasis/pneumonia.  Given strict precautions.  Discussed trying a different OTC antihistamine as well as Flonase or nasal saline rinse for postnasal drip/allergic rhinitis.  Return if symptoms worsen or fail to improve.   Viola Greulich, MD

## 2024-03-06 ENCOUNTER — Other Ambulatory Visit: Payer: Self-pay

## 2024-03-06 DIAGNOSIS — E782 Mixed hyperlipidemia: Secondary | ICD-10-CM

## 2024-03-06 MED ORDER — ROSUVASTATIN CALCIUM 20 MG PO TABS: 20.0000 mg | ORAL_TABLET | Freq: Every day | ORAL | 0 refills | Status: DC

## 2024-03-07 ENCOUNTER — Other Ambulatory Visit: Payer: Self-pay | Admitting: Adult Health

## 2024-03-10 ENCOUNTER — Ambulatory Visit (INDEPENDENT_AMBULATORY_CARE_PROVIDER_SITE_OTHER): Payer: Medicare PPO | Admitting: Family Medicine

## 2024-03-10 DIAGNOSIS — Z0000 Encounter for general adult medical examination without abnormal findings: Secondary | ICD-10-CM

## 2024-03-10 NOTE — Patient Instructions (Signed)
 I really enjoyed getting to talk with you today! I am available on Tuesdays and Thursdays for virtual visits if you have any questions or concerns, or if I can be of any further assistance.   CHECKLIST FROM ANNUAL WELLNESS VISIT:  -Follow up (please call to schedule if not scheduled after visit):   -yearly for annual wellness visit with primary care office  Here is a list of your preventive care/health maintenance measures and the plan for each if any are due:  PLAN For any measures below that may be due:   Health Maintenance  Topic Date Due   COVID-19 Vaccine (8 - 2024-25 season) 02/26/2024   INFLUENZA VACCINE  06/12/2024   MAMMOGRAM  07/28/2024   Medicare Annual Wellness (AWV)  03/10/2025   Pneumonia Vaccine 25+ Years old (3 of 3 - PCV20 or PCV21) 10/18/2026   DTaP/Tdap/Td (2 - Td or Tdap) 01/01/2029   Colonoscopy  01/13/2034   DEXA SCAN  Completed   Hepatitis C Screening  Completed   Zoster Vaccines- Shingrix  Completed   HPV VACCINES  Aged Out   Meningococcal B Vaccine  Aged Out    -See a dentist at least yearly  -Get your eyes checked and then per your eye specialist's recommendations  -Other issues addressed today:   -I have included below further information regarding a healthy whole foods based diet, physical activity guidelines for adults, stress management and opportunities for social connections. I hope you find this information useful.   -----------------------------------------------------------------------------------------------------------------------------------------------------------------------------------------------------------------------------------------------------------    NUTRITION: -eat real food: lots of colorful vegetables (half the plate) and fruits -5-7 servings of vegetables and fruits per day (fresh or steamed is best), exp. 2 servings of vegetables with lunch and dinner and 2 servings of fruit per day. Berries and greens such as kale and  collards are great choices.  -consume on a regular basis:  fresh fruits, fresh veggies, fish, nuts, seeds, healthy oils (such as olive oil, avocado oil), whole grains (make sure for bread/pasta/crackers/etc., that the first ingredient on label contains the word "whole"), legumes. -can eat small amounts of dairy and lean meat (no larger than the palm of your hand), but avoid processed meats such as ham, bacon, lunch meat, etc. -drink water -try to avoid fast food and pre-packaged foods, processed meat, ultra processed foods/beverages (donuts, candy, etc.) -most experts advise limiting sodium to < 2300mg  per day, should limit further is any chronic conditions such as high blood pressure, heart disease, diabetes, etc. The American Heart Association advised that < 1500mg  is is ideal -try to avoid foods/beverages that contain any ingredients with names you do not recognize  -try to avoid foods/beverages  with added sugar or sweeteners/sweets  -try to avoid sweet drinks (including diet drinks): soda, juice, Gatorade, sweet tea, power drinks, diet drinks -try to avoid white rice, white bread, pasta (unless whole grain)  EXERCISE GUIDELINES FOR ADULTS: -if you wish to increase your physical activity, do so gradually and with the approval of your doctor -STOP and seek medical care immediately if you have any chest pain, chest discomfort or trouble breathing when starting or increasing exercise  -move and stretch your body, legs, feet and arms when sitting for long periods -Physical activity guidelines for optimal health in adults: -get at least 150 minutes per week of moderate exercise (can talk, but not sing); this is about 20-30 minutes of sustained activity 5-7 days per week or two 10-15 minute episodes of sustained activity 5-7 days per week -do some  muscle building/resistance training/strength training at least 2 days per week  -balance exercises 3+ days per week:   Stand somewhere where you have  something sturdy to hold onto if you lose balance    1) lift up on toes, then back down, start with 5x per day and work up to 20x   2) stand and lift one leg straight out to the side so that foot is a few inches of the floor, start with 5x each side and work up to 20x each side   3) stand on one foot, start with 5 seconds each side and work up to 20 seconds on each side  If you need ideas or help with getting more active:  -Silver sneakers https://tools.silversneakers.com  -Walk with a Doc: http://www.duncan-williams.com/  -try to include resistance (weight lifting/strength building) and balance exercises twice per week: or the following link for ideas: http://castillo-powell.com/  BuyDucts.dk  STRESS MANAGEMENT: -can try meditating, or just sitting quietly with deep breathing while intentionally relaxing all parts of your body for 5 minutes daily -if you need further help with stress, anxiety or depression please follow up with your primary doctor or contact the wonderful folks at [facility]: [phone]  SOCIAL CONNECTIONS: -options in [place] if you wish to engage in more social and exercise related activities:  -Silver sneakers https://tools.silversneakers.com  -Walk with a Doc: http://www.duncan-williams.com/  -Check out the [facility] Active Adults 50+ section on the [place] [employer] (hiking clubs, book clubs, cards and games, chess, exercise classes, aquatic classes and much more) - see the website for details: https://www.[place].gov/departments/parks-recreation/active-adults50  -YouTube has lots of exercise videos for different ages and abilities as well  -[facility] (a variety of indoor and outdoor inperson activities for adults). ([phone])454-2402. [address]  -Virtual Online Classes (a variety of topics): see seniorplanet.org or call  [phone]  -consider volunteering at a school, hospice center, church, senior center or elsewhere

## 2024-03-10 NOTE — Progress Notes (Addendum)
 PATIENT CHECK-IN and HEALTH RISK ASSESSMENT QUESTIONNAIRE:  -completed by phone/video for upcoming Medicare Preventive Visit  Pre-Visit Check-in: 1)Vitals (height, wt, BP, etc) - record in vitals section for visit on day of visit Request home vitals (wt, BP, etc.) and enter into vitals, THEN update Vital Signs SmartPhrase below at the top of the HPI. See below.  2)Review and Update Medications, Allergies PMH, Surgeries, Social history in Epic 3)Hospitalizations in the last year with date/reason? n  4)Review and Update Care Team (patient's specialists) in Epic 5) Complete PHQ9 in Epic  6) Complete Fall Screening in Epic 7)Review all Health Maintenance Due and order under PCP if not done.  Medicare Wellness Patient Questionnaire:  Answer theses question about your habits: How often do you have a drink containing alcohol? 4x/wk How many drinks containing alcohol do you have on a typical day when you are drinking?1 How often do you have six or more drinks on one occasion?never Have you ever smoked?n How many packs a day do/did you smoke? na Do you use smokeless tobacco?n Do you use an illicit drugs?n On average, how many days per week do you engage in moderate to strenuous exercise (like a brisk walk)?3 days for 50 minutes Typical breakfast: coffee and cream Typical lunch: sandwich, apple Typical dinner: protein, veggie, carb Typical snacks:doesn't eat much deserts  Beverages:  water, one diet coke a day  Answer theses question about your everyday activities: Can you perform most household chores?n Are you deaf or have significant trouble hearing?y, has hearing aids, AIMs hearing Do you feel that you have a problem with memory?some - but seems normal and comes to her eventually Do you feel safe at home? yes Last dentist visit?yes twice a year 8. Do you have any difficulty performing your everyday activities?n Are you having any difficulty walking, taking medications on your own, and  or difficulty managing daily home needs?n Do you have difficulty walking or climbing stairs?n Do you have difficulty dressing or bathing?n Do you have difficulty doing errands alone such as visiting a doctor's office or shopping?n Do you currently have any difficulty preparing food and eating?n Do you currently have any difficulty using the toilet?n Do you have any difficulty managing your finances?n Do you have any difficulties with housekeeping of managing your housekeeping?n   Do you have Advanced Directives in place (Living Will, Healthcare Power or Attorney)? yes   Last eye Exam and location? Wears  contacts, does annual eye exam, [facility]   Do you currently use prescribed or non-prescribed narcotic or opioid pain medications? no  Do you have a history or close family history of breast, ovarian, tubal or peritoneal cancer or a family member with BRCA (breast cancer susceptibility 1 and 2) gene mutations? Pancreatic cancer    ----------------------------------------------------------------------------------------------------------------------------------------------------------------------------------------------------------------------  Because this visit was a virtual/telehealth visit, some criteria may be missing or patient reported. Any vitals not documented were not able to be obtained and vitals that have been documented are patient reported.    MEDICARE ANNUAL PREVENTIVE VISIT WITH PROVIDER: (Welcome to Medicare, initial annual wellness or annual wellness exam)  Virtual Visit via Video Note  I connected with [facility] on 03/10/24 by a video enabled telemedicine application and verified that I am speaking with the correct person using two identifiers.  Location patient: home Location provider:work or home office Persons participating in the virtual visit: patient, provider  Concerns and/or follow up today: no new concerns. Had a fall several weeks  ago.  Bruised some ribs. Improving.    See HM section in Epic for other details of completed HM.    ROS: negative for report of fevers, unintentional weight loss, vision changes, vision loss,chest pain, sob, hemoptysis, melena, hematochezia, hematuria, falls, bleeding or bruising, thoughts of suicide or self harm, memory loss  Patient-completed extensive health risk assessment - reviewed and discussed with the patient: See Health Risk Assessment completed with patient prior to the visit either above or in recent phone note. This was reviewed in detailed with the patient today and appropriate recommendations, orders and referrals were placed as needed per Summary below and patient instructions.   Review of Medical History: -PMH, PSH, Family History and current specialty and care providers reviewed and updated and listed below   Patient Care Team: Alto Atta, NP as PCP - General (Family Medicine) Knox Perl, MD as Consulting Physician (Cardiology) Alix Aquas, MD as Referring Physician (Orthopedic Surgery)   Past Medical History:  Diagnosis Date   Allergy    Anemia    "many years ago"   Anxiety    Arthritis    Cancer (HCC)    skin cancer (basal cell)   Constipation by delayed colonic transit    Coronary artery disease    Depression    see Meridith Larey Plenty and Bearl Botts   Dyspnea    occ   Grover's disease    Headache    Hx of migraines    NSTEMI (non-ST elevated myocardial infarction) (HCC) 02/2016   Pneumonia    walking pneumonia   Restless leg syndrome    Thyroid  disease    hx of hypothyroid    Past Surgical History:  Procedure Laterality Date   APPENDECTOMY     AUGMENTATION MAMMAPLASTY Bilateral    Patient doesn't remember date   BACK SURGERY     L4-L5   BREAST SURGERY     augmentation   CARDIAC CATHETERIZATION N/A 03/08/2016   Procedure: Left Heart Cath and Coronary Angiography;  Surgeon: Knox Perl, MD;  Location: [facility] INVASIVE CV LAB;  Service:  Cardiovascular;  Laterality: N/A;   CARDIAC CATHETERIZATION N/A 03/08/2016   Procedure: Coronary Stent Intervention;  Surgeon: Knox Perl, MD;  Location: [facility] INVASIVE CV LAB;  Service: Cardiovascular;  Laterality: N/A;  Resolute 3.5x15   CARDIAC CATHETERIZATION N/A 03/08/2016   Procedure: Intravascular Ultrasound/IVUS;  Surgeon: Knox Perl, MD;  Location: [facility] INVASIVE CV LAB;  Service: Cardiovascular;  Laterality: N/A;  RCA MID   CESAREAN SECTION     x2   COLONOSCOPY     CORONARY ANGIOGRAM  03/08/2016   CORONARY ANGIOPLASTY     DES LAD, DES D1 02/16/16 [facility], [place]), DES RCA 03/08/16 [facility])   LAMINECTOMY WITH POSTERIOR LATERAL ARTHRODESIS LEVEL 2 N/A 03/07/2017   Procedure: Lumbar Four-Five, Lumbar Five-Sacral One Posterior Lateral Fusion with Removal of pedicle screws;  Surgeon: Isadora Mar, MD;  Location[facility] OR;  Service: Neurosurgery;  Laterality: N/A;    Social History   Socioeconomic History   Marital status: Married    Spouse name: Not on file   Number of children: 2   Years of education: Not on file   Highest education level: Master's degree (e.g., MA, MS, MEng, MEd, MSW, MBA)  Occupational History   Not on file  Tobacco Use   Smoking status: Never   Smokeless tobacco: Never  Vaping Use   Vaping status: Never Used  Substance and Sexual Activity   Alcohol use: Yes    Alcohol/week: 7.0 standard drinks  of alcohol    Types: 7 Glasses of wine per week    Comment: occassionally   Drug use: No   Sexual activity: Not on file  Other Topics Concern   Not on file  Social History Narrative   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    Social Drivers of Corporate investment banker Strain: Low Risk  (12/11/2023)   Overall Financial Resource Strain (CARDIA)    Difficulty of Paying Living Expenses: Not hard  at all  Food Insecurity: No Food Insecurity (12/11/2023)   Hunger Vital Sign    Worried About Running Out of Food in the Last Year: Never true    Ran Out of Food in the Last Year: Never true  Transportation Needs: No Transportation Needs (12/11/2023)   PRAPARE - Administrator, Civil Service (Medical): No    Lack of Transportation (Non-Medical): No  Physical Activity: Sufficiently Active (12/11/2023)   Exercise Vital Sign    Days of Exercise per Week: 3 days    Minutes of Exercise per Session: 50 min  Stress: No Stress Concern Present (12/11/2023)   [employer] of Occupational Health - Occupational Stress Questionnaire    Feeling of Stress : Only a little  Social Connections: Moderately Integrated (12/11/2023)   Social Connection and Isolation Panel [NHANES]    Frequency of Communication with Friends and Family: More than three times a week    Frequency of Social Gatherings with Friends and Family: Twice a week    Attends Religious Services: Never    Database administrator or Organizations: Yes    Attends Engineer, structural: More than 4 times per year    Marital Status: Married  Catering manager Violence: Not At Risk (10/23/2023)   Received from [facility]   HITS    Over the last 12 months how often did your partner physically hurt you?: Never    Over the last 12 months how often did your partner insult you or talk down to you?: Never    Over the last 12 months how often did your partner threaten you with physical harm?: Never    Over the last 12 months how often did your partner scream or curse at you?: Never    Family History  Problem Relation Age of Onset   Cancer Mother        pancreatic   Congestive Heart Failure Father    Heart attack Brother        Had blockage, almost a HA   Heart disease Brother    Cancer Maternal Uncle        pancreatic   Cancer Maternal Grandmother        pancreatic   Breast cancer Neg Hx     Current Outpatient  Medications on File Prior to Visit  Medication Sig Dispense Refill   aspirin  81 MG tablet Take 81 mg by mouth daily.      calcium -vitamin D  (OSCAL WITH D) 250-125 MG-UNIT tablet Take 4 tablets by mouth daily. Pt is taking 2 tabs in the am and 2 tabs in the pm     celecoxib  (CELEBREX ) 200 MG capsule TAKE 1 CAPSULE BY MOUTH EVERY DAY  90 capsule 1   FLUoxetine  (PROZAC ) 40 MG capsule TAKE 2 CAPSULES (80 MG TOTAL) BY MOUTH EVERY EVENING. 180 capsule 1   Melatonin 10 MG TABS Take 10 mg by mouth at bedtime.     metFORMIN  (GLUCOPHAGE ) 500 MG tablet TAKE 1/2 TABLET BY MOUTH DAILY WITH BREAKFAST 45 tablet 1   metoprolol  succinate (TOPROL -XL) 25 MG 24 hr tablet TAKE 1 TABLET BY MOUTH EVERY DAY WITH OR IMMEDIATELY FOLLOWING A MEAL 90 tablet 3   niacin  (NIASPAN ) 1000 MG CR tablet TAKE 1 TABLET BY MOUTH TWICE A DAY 180 tablet 0   Probiotic Product (PROBIOTIC DAILY PO) Take 1 capsule by mouth daily.     ramipril  (ALTACE ) 2.5 MG capsule Take 1 capsule (2.5 mg total) by mouth daily. 90 capsule 2   rosuvastatin  (CRESTOR ) 20 MG tablet Take 1 tablet (20 mg total) by mouth daily. 90 tablet 0   No current facility-administered medications on file prior to visit.    No Known Allergies     Physical Exam Vitals requested from patient and listed below if patient had equipment and was able to obtain at home for this virtual visit: There were no vitals filed for this visit. Estimated body mass index is 27.78 kg/m as calculated from the following:   Height as of 12/11/23: 5' 3.75" (1.619 m).   Weight as of 03/02/24: 160 lb 9.6 oz (72.8 kg).  EKG (optional): deferred due to virtual visit  GENERAL: alert, oriented, no acute distress detected, full vision exam deferred due to pandemic and/or virtual encounter   HEENT: atraumatic, conjunttiva clear, no obvious abnormalities on inspection of external nose and ears  NECK: normal movements of the head and neck  LUNGS: on inspection no signs of respiratory distress,  breathing rate appears normal, no obvious gross SOB, gasping or wheezing  CV: no obvious cyanosis  MS: moves all visible extremities without noticeable abnormality  PSYCH/NEURO: pleasant and cooperative, no obvious depression or anxiety, speech and thought processing grossly intact, Cognitive function grossly intact  Flowsheet Row Office Visit from 04/05/2023 in [facility] [place]  PHQ-9 Total Score 0           03/10/2024   12:30 PM 03/02/2024    8:22 AM 04/05/2023    8:34 AM 12/26/2022    8:12 AM 10/18/2022   12:52 PM  Depression screen PHQ 2/9  Decreased Interest 0 0 0 1 0  Down, Depressed, Hopeless 0 0 0 0 0  PHQ - 2 Score 0 0 0 1 0  Altered sleeping   0 1 2  Tired, decreased energy   0 3 0  Change in appetite   0 0 0  Feeling bad or failure about yourself    0 0 0  Trouble concentrating   0 0 0  Moving slowly or fidgety/restless   0 0 0  Suicidal thoughts   0 0 0  PHQ-9 Score   0 5 2  Difficult doing work/chores   Not difficult at all Somewhat difficult Not difficult at all       04/05/2023    8:34 AM 12/25/2023    3:38 PM 03/02/2024    8:22 AM 03/09/2024    8:21 AM 03/10/2024   12:23 PM  Fall Risk  Falls in the past year? 0 1 1 1 1   Was there an injury with Fall? 0 1 1 1 1   Fall Risk Category Calculator 0 3  2 3  2   Patient  at Risk for Falls Due to No Fall Risks      Fall risk Follow up Falls evaluation completed  Falls evaluation completed  Falls evaluation completed;Education provided     Patient-reported     SUMMARY AND PLAN:  Encounter for Medicare annual wellness exam  Discussed applicable health maintenance/preventive health measures and advised and referred or ordered per patient preferences: -patient had colonscopy with Dr. Exie Holler[facility] on 01/14/24 - she pulled up while we were talking, reports was told was normal and would not need another. Sent request to their office for copy of report to  be sent.  -advised on covid vaccine recs/ risks and advised can get at the pharmacy if decides to do.  Reviewed bone density results from last check, bone building exercise and adequate vit D/Ca. She declined repeat bone density today. Advise she let us  know if she changes her mind.  Health Maintenance  Topic Date Due   COVID-19 Vaccine (8 - 2024-25 season) 02/26/2024   INFLUENZA VACCINE  06/12/2024   MAMMOGRAM  07/28/2024   Medicare Annual Wellness (AWV)  03/10/2025   Pneumonia Vaccine 60+ Years old (3 of 3 - PCV20 or PCV21) 10/18/2026   DTaP/Tdap/Td (2 - Td or Tdap) 01/01/2029   Colonoscopy  01/13/2034   DEXA SCAN  Completed   Hepatitis C Screening  Completed   Zoster Vaccines- Shingrix  Completed   HPV VACCINES  Aged Out   Meningococcal B Vaccine  Aged Out      Education and counseling on the following was provided based on the above review of health and a plan/checklist for the patient, along with additional information discussed, was provided for the patient in the patient instructions :   -Provided counseling and plan for increased risk of falling if applicable per above screening. Reviewed and demonstrated safe balance exercises that can be done at home to improve balance and discussed exercise guidelines for adults with include balance exercises at least 3 days per week.  -Advised and counseled on a healthy lifestyle - including the importance of a healthy diet, regular physical activity, social connections and stress management. -Reviewed patient's current diet. Advised and counseled on a whole foods based healthy diet, particular emphasis was given for EB recs for the management of prediabetes and CV disease. A summary of a healthy diet was provided in the Patient Instructions.  -reviewed patient's current physical activity level and discussed exercise guidelines for adults. Discussed community resources and ideas for safe exercise at home to assist in meeting exercise guideline  recommendations in a safe and healthy way. She says she plans to start adding in some balance exercises and strength building exercises.  -Advise yearly dental visits at minimum and regular eye exams   Follow up: see patient instructions     Patient Instructions  I really enjoyed getting to talk with you today! I am available on Tuesdays and Thursdays for virtual visits if you have any questions or concerns, or if I can be of any further assistance.   CHECKLIST FROM ANNUAL WELLNESS VISIT:  -Follow up (please call to schedule if not scheduled after visit):   -yearly for annual wellness visit with primary care office  Here is a list of your preventive care/health maintenance measures and the plan for each if any are due:  PLAN For any measures below that may be due:   Health Maintenance  Topic Date Due   COVID-19 Vaccine (8 - 2024-25 season) 02/26/2024  INFLUENZA VACCINE  06/12/2024   MAMMOGRAM  07/28/2024   Medicare Annual Wellness (AWV)  03/10/2025   Pneumonia Vaccine 69+ Years old (3 of 3 - PCV20 or PCV21) 10/18/2026   DTaP/Tdap/Td (2 - Td or Tdap) 01/01/2029   Colonoscopy  01/13/2034   DEXA SCAN  Completed   Hepatitis C Screening  Completed   Zoster Vaccines- Shingrix  Completed   HPV VACCINES  Aged Out   Meningococcal B Vaccine  Aged Out    -See a dentist at least yearly  -Get your eyes checked and then per your eye specialist's recommendations  -Other issues addressed today:   -I have included below further information regarding a healthy whole foods based diet, physical activity guidelines for adults, stress management and opportunities for social connections. I hope you find this information useful.    -----------------------------------------------------------------------------------------------------------------------------------------------------------------------------------------------------------------------------------------------------------    NUTRITION: -eat real food: lots of colorful vegetables (half the plate) and fruits -5-7 servings of vegetables and fruits per day (fresh or steamed is best), exp. 2 servings of vegetables with lunch and dinner and 2 servings of fruit per day. Berries and greens such as kale and collards are great choices.  -consume on a regular basis:  fresh fruits, fresh veggies, fish, nuts, seeds, healthy oils (such as olive oil, avocado oil), whole grains (make sure for bread/pasta/crackers/etc., that the first ingredient on label contains the word "whole"), legumes. -can eat small amounts of dairy and lean meat (no larger than the palm of your hand), but avoid processed meats such as ham, bacon, lunch meat, etc. -drink water -try to avoid fast food and pre-packaged foods, processed meat, ultra processed foods/beverages (donuts, candy, etc.) -most experts advise limiting sodium to < 2300mg  per day, should limit further is any chronic conditions such as high blood pressure, heart disease, diabetes, etc. The American Heart Association advised that < 1500mg  is is ideal -try to avoid foods/beverages that contain any ingredients with names you do not recognize  -try to avoid foods/beverages  with added sugar or sweeteners/sweets  -try to avoid sweet drinks (including diet drinks): soda, juice, Gatorade, sweet tea, power drinks, diet drinks -try to avoid white rice, white bread, pasta (unless whole grain)  EXERCISE GUIDELINES FOR ADULTS: -if you wish to increase your physical activity, do so gradually and with the approval of your doctor -STOP and seek medical care immediately if you have any chest pain, chest discomfort or trouble breathing when starting or  increasing exercise  -move and stretch your body, legs, feet and arms when sitting for long periods -Physical activity guidelines for optimal health in adults: -get at least 150 minutes per week of moderate exercise (can talk, but not sing); this is about 20-30 minutes of sustained activity 5-7 days per week or two 10-15 minute episodes of sustained activity 5-7 days per week -do some muscle building/resistance training/strength training at least 2 days per week  -balance exercises 3+ days per week:   Stand somewhere where you have something sturdy to hold onto if you lose balance    1) lift up on toes, then back down, start with 5x per day and work up to 20x   2) stand and lift one leg straight out to the side so that foot is a few inches of the floor, start with 5x each side and work up to 20x each side   3) stand on one foot, start with 5 seconds each side and work up to 20 seconds on each side  If  you need ideas or help with getting more active:  -Silver sneakers https://tools.silversneakers.com  -Walk with a Doc: http://www.duncan-williams.com/  -try to include resistance (weight lifting/strength building) and balance exercises twice per week: or the following link for ideas: http://castillo-powell.com/  BuyDucts.dk  STRESS MANAGEMENT: -can try meditating, or just sitting quietly with deep breathing while intentionally relaxing all parts of your body for 5 minutes daily -if you need further help with stress, anxiety or depression please follow up with your primary doctor or contact the wonderful folks at [facility]: [phone]  SOCIAL CONNECTIONS: -options in [place] if you wish to engage in more social and exercise related activities:  -Silver sneakers https://tools.silversneakers.com  -Walk with a Doc: http://www.duncan-williams.com/  -Check out the [facility] Active Adults 50+  section on the [place] of [employer] (hiking clubs, book clubs, cards and games, chess, exercise classes, aquatic classes and much more) - see the website for details: https://www.[place].gov/departments/parks-recreation/active-adults50  -YouTube has lots of exercise videos for different ages and abilities as well  -[facility] (a variety of indoor and outdoor inperson activities for adults). [phone]. [address]  -Virtual Online Classes (a variety of topics): see seniorplanet.org or call 8176628637  -consider volunteering at a school, hospice center, church, senior center or elsewhere            Maurie Southern, DO

## 2024-03-19 ENCOUNTER — Ambulatory Visit: Payer: Medicare PPO | Admitting: Cardiology

## 2024-04-19 NOTE — Progress Notes (Signed)
 " Cardiology Office Note:  .   Date:  04/20/2024  ID:  Monique Miller, DOB 02-20-1951, MRN [id number] PCP: Merna Huxley, NP  [facility] Providers Cardiologist:  None   History of Present Illness: .   Monique Miller is a 73 y.o. Caucasian female patient with NSTEMI SP PCI to LAD and D1 in [place], [place]  for occluded diagonal and mid LAD in 2016, IVUS guided PCI to the RCA on  03/08/2016.   Past medical history significant for mixed hyperlipidemia with elevated LPA,  LDL within normal limits presently tolerating high-dose, high intensity statin along with niacin , prediabetes. She has family history of premature coronary disease with brother having had MI at age 33.   She has had a low risk nuclear stress test on 08/31/2020 with no reversible ischemic defect and echocardiogram at that time revealed normal EF at 60%.  Discussed the use of AI scribe software for clinical note transcription with the patient, who gave verbal consent to proceed.  History of Present Illness Monique Miller is a 73 year old female with coronary artery disease who presents for routine follow-up.She has a history of coronary artery disease with previous myocardial infarction and stent placement in the right coronary artery, LAD, and diagonal branch of the LAD. She remains asymptomatic and maintains regular exercise, including walking and weight training.  Her medications include rosuvastatin  20 mg daily, metoprolol , and a baby aspirin  daily. She also takes niacin  for mixed hypercholesterolemia. Her LDL cholesterol is 63 mg/dL as of December 7975.  There is a family history of heart disease, with her older brother having undergone a triple bypass. She is on metformin  for blood sugar management, with a hemoglobin A1c of 6.2%.  Labs   Lab Results  Component Value Date   CHOL 138 10/22/2023   HDL 59.90 10/22/2023   LDLCALC 63 10/22/2023   LDLDIRECT 115.5 11/09/2011   TRIG 75.0 10/22/2023   CHOLHDL 2  10/22/2023   Lab Results  Component Value Date   NA 138 10/22/2023   K 4.2 10/22/2023   CO2 28 10/22/2023   GLUCOSE 108 (H) 10/22/2023   BUN 21 10/22/2023   CREATININE 0.97 10/22/2023   CALCIUM  9.2 10/22/2023   GFR 59.38 (L) 10/22/2023   GFRNONAA 63 08/17/2020      Latest Ref Rng & Units 10/22/2023    8:37 AM 10/18/2022    1:21 PM 10/20/2021    9:05 AM  BMP  Glucose 70 - 99 mg/dL 891  98  94   BUN 6 - 23 mg/dL 21  16  19    Creatinine 0.40 - 1.20 mg/dL 9.02  9.12  9.08   Sodium 135 - 145 mEq/L 138  136  137   Potassium 3.5 - 5.1 mEq/L 4.2  4.2  4.3   Chloride 96 - 112 mEq/L 102  101  103   CO2 19 - 32 mEq/L 28  28  25    Calcium  8.4 - 10.5 mg/dL 9.2  9.1  9.1       Latest Ref Rng & Units 10/22/2023    8:37 AM 10/18/2022    1:21 PM 10/20/2021    9:05 AM  CBC  WBC 4.0 - 10.5 K/uL 4.7  5.7  4.5   Hemoglobin 12.0 - 15.0 g/dL 86.3  86.1  86.0   Hematocrit 36.0 - 46.0 % 41.1  40.6  41.2   Platelets 150.0 - 400.0 K/uL 179.0  196.0  174.0    Lab Results  Component  Value Date   HGBA1C 6.2 10/22/2023    Lab Results  Component Value Date   TSH 2.59 10/22/2023    ROS  Review of Systems  Cardiovascular:  Negative for chest pain, dyspnea on exertion and leg swelling.    Physical Exam:   VS:  BP 122/60   Pulse (!) 56   Ht 5' 4 (1.626 m)   Wt 160 lb (72.6 kg)   SpO2 93%   BMI 27.46 kg/m    Wt Readings from Last 3 Encounters:  04/20/24 160 lb (72.6 kg)  03/02/24 160 lb 9.6 oz (72.8 kg)  12/11/23 162 lb (73.5 kg)    Physical Exam Neck:     Vascular: No carotid bruit or JVD.  Cardiovascular:     Rate and Rhythm: Normal rate and regular rhythm.     Pulses: Intact distal pulses.     Heart sounds: Normal heart sounds. No murmur heard.    No gallop.  Pulmonary:     Effort: Pulmonary effort is normal.     Breath sounds: Normal breath sounds.  Abdominal:     General: Bowel sounds are normal.     Palpations: Abdomen is soft.  Musculoskeletal:     Right lower leg: No  edema.     Left lower leg: No edema.    Studies Reviewed: SABRA    Coronary angiogram 03/08/2016: PCI to Prox RCA with 3.5x1 mm Resolute. Acute MI 02/16/2016 S/P PCI to LAD and D1 with the 2.75 x 23 mm Xience DES and 2.5 x 15 mm Xience DES respectively.   EKG:    EKG Interpretation Date/Time:  Monday April 20 2024 10:40:03 EDT Ventricular Rate:  56 PR Interval:  172 QRS Duration:  80 QT Interval:  444 QTC Calculation: 428 R Axis:   68  Text Interpretation: EKG 04/20/2024: Sinus bradycardia at rate of 56 bpm, normal EKG.  No change from 04/01/2017. Confirmed by Sebastain Fishbaugh, Jagadeesh ([id number]) on 04/20/2024 10:43:26 AM    Medications and allergies    No Known Allergies   Current Outpatient Medications:    aspirin  81 MG tablet, Take 81 mg by mouth daily. , Disp: , Rfl:    calcium -vitamin D  (OSCAL WITH D) 250-125 MG-UNIT tablet, Take 4 tablets by mouth daily. Pt is taking 2 tabs in the am and 2 tabs in the pm, Disp: , Rfl:    celecoxib  (CELEBREX ) 200 MG capsule, TAKE 1 CAPSULE BY MOUTH EVERY DAY, Disp: 90 capsule, Rfl: 1   FLUoxetine  (PROZAC ) 40 MG capsule, TAKE 2 CAPSULES (80 MG TOTAL) BY MOUTH EVERY EVENING., Disp: 180 capsule, Rfl: 1   Melatonin 10 MG TABS, Take 10 mg by mouth at bedtime., Disp: , Rfl:    metFORMIN  (GLUCOPHAGE ) 500 MG tablet, TAKE 1/2 TABLET BY MOUTH DAILY WITH BREAKFAST, Disp: 45 tablet, Rfl: 1   metoprolol  succinate (TOPROL -XL) 25 MG 24 hr tablet, TAKE 1 TABLET BY MOUTH EVERY DAY WITH OR IMMEDIATELY FOLLOWING A MEAL, Disp: 90 tablet, Rfl: 3   niacin  (NIASPAN ) 1000 MG CR tablet, TAKE 1 TABLET BY MOUTH TWICE A DAY (Patient taking differently: Take 1,000 mg by mouth daily.), Disp: 180 tablet, Rfl: 0   Probiotic Product (PROBIOTIC DAILY PO), Take 1 capsule by mouth daily., Disp: , Rfl:    ramipril  (ALTACE ) 2.5 MG capsule, Take 1 capsule (2.5 mg total) by mouth daily., Disp: 90 capsule, Rfl: 2   rosuvastatin  (CRESTOR ) 20 MG tablet, Take 1 tablet (20 mg total) by mouth daily., Disp: 90  tablet, Rfl: 0   No orders of the defined types were placed in this encounter.    There are no discontinued medications.   ASSESSMENT AND PLAN: .      ICD-10-CM   1. Coronary artery disease involving native coronary artery of native heart without angina pectoris  I25.10 EKG 12-Lead    2. Mixed hyperlipidemia  E78.2 EKG 12-Lead    3. Elevated lipoprotein(a) 08/06/2019: 120.7  E78.41       Assessment and Plan Assessment & Plan Coronary Artery Disease (CAD) with stents Coronary artery disease with three stents in the right coronary artery, LAD, and diagonal branch of the LAD post-myocardial infarction. Asymptomatic, with regular exercise and a healthy diet. Normal EKG and stress test in 2021. Cholesterol levels are well-controlled with medication. She is on aspirin  and metoprolol  to prevent further cardiac events. Current medications are crucial to prevent invasive procedures like bypass surgery. Aggressive cholesterol management and anticoagulation have significantly reduced her risk of requiring bypass surgery. - Continue aspirin  81 mg daily. - Continue metoprolol  25 mg daily. - Continue regular exercise and healthy diet. - Follow up in one year.  Mixed Hyperlipidemia Mixed hyperlipidemia with familial elevated Lp(a). LDL is well-controlled at 63 mg/dL, below the target of 70 mg/dL. On rosuvastatin  and niacin  for cholesterol management. Aggressive cholesterol management to prevent cardiovascular events. Niacin  specifically addresses elevated Lp(a) which is only minimally elevated and patient presently 73 years of age and remains active.  - Continue rosuvastatin  20 mg daily. - Continue niacin  as prescribed.  Prediabetes Prediabetes managed with metformin , current hemoglobin A1c at 6.2%. Without metformin , A1c might rise to 6.5%, indicating diabetes. Takes metformin  every other day due to difficulty splitting the tablet, which is acceptable. - Continue metformin  as currently taken. -  Monitor hemoglobin A1c regularly. - Return visit in 1 year   Signed,  Gordy Bergamo, MD, [facility] 04/20/2024, 10:52 AM [facility] [address] [place], [place] [address] Phone: [phone]. Fax:  [phone]  "

## 2024-04-20 ENCOUNTER — Ambulatory Visit: Attending: Cardiology | Admitting: Cardiology

## 2024-04-20 ENCOUNTER — Encounter: Payer: Self-pay | Admitting: Cardiology

## 2024-04-20 VITALS — BP 122/60 | HR 56 | Ht 64.0 in | Wt 160.0 lb

## 2024-04-20 DIAGNOSIS — E7841 Elevated Lipoprotein(a): Secondary | ICD-10-CM | POA: Diagnosis not present

## 2024-04-20 DIAGNOSIS — I2510 Atherosclerotic heart disease of native coronary artery without angina pectoris: Secondary | ICD-10-CM

## 2024-04-20 DIAGNOSIS — E782 Mixed hyperlipidemia: Secondary | ICD-10-CM

## 2024-04-20 NOTE — Patient Instructions (Signed)
 Medication Instructions:  The current medical regimen is effective;  continue present plan and medications.  *If you need a refill on your cardiac medications before your next appointment, please call your pharmacy*  Follow-Up: At [facility], you and your health needs are our priority.  As part of our continuing mission to provide you with exceptional heart care, our providers are all part of one team.  This team includes your primary Cardiologist (physician) and Advanced Practice Providers or APPs (Physician Assistants and Nurse Practitioners) who all work together to provide you with the care you need, when you need it.  Your next appointment:   1 year(s)  Provider:   Knox Perl, MD    We recommend signing up for the patient portal called "MyChart".  Sign up information is provided on this After Visit Summary.  MyChart is used to connect with patients for Virtual Visits (Telemedicine).  Patients are able to view lab/test results, encounter notes, upcoming appointments, etc.  Non-urgent messages can be sent to your provider as well.   To learn more about what you can do with MyChart, go to [email or web address].

## 2024-05-12 ENCOUNTER — Ambulatory Visit: Admitting: Cardiology

## 2024-05-29 ENCOUNTER — Other Ambulatory Visit: Payer: Self-pay | Admitting: Cardiology

## 2024-05-29 DIAGNOSIS — I2510 Atherosclerotic heart disease of native coronary artery without angina pectoris: Secondary | ICD-10-CM

## 2024-05-29 DIAGNOSIS — E7841 Elevated Lipoprotein(a): Secondary | ICD-10-CM

## 2024-05-30 ENCOUNTER — Other Ambulatory Visit: Payer: Self-pay | Admitting: Cardiology

## 2024-05-30 DIAGNOSIS — E782 Mixed hyperlipidemia: Secondary | ICD-10-CM

## 2024-06-03 ENCOUNTER — Other Ambulatory Visit: Payer: Self-pay | Admitting: Cardiology

## 2024-06-03 ENCOUNTER — Other Ambulatory Visit: Payer: Self-pay | Admitting: Adult Health

## 2024-06-03 DIAGNOSIS — I2510 Atherosclerotic heart disease of native coronary artery without angina pectoris: Secondary | ICD-10-CM

## 2024-06-15 ENCOUNTER — Other Ambulatory Visit: Payer: Self-pay | Admitting: Adult Health

## 2024-06-15 DIAGNOSIS — Z1231 Encounter for screening mammogram for malignant neoplasm of breast: Secondary | ICD-10-CM

## 2024-07-07 ENCOUNTER — Other Ambulatory Visit: Payer: Self-pay | Admitting: Cardiology

## 2024-07-07 DIAGNOSIS — I2510 Atherosclerotic heart disease of native coronary artery without angina pectoris: Secondary | ICD-10-CM

## 2024-07-28 ENCOUNTER — Other Ambulatory Visit: Payer: Self-pay | Admitting: Medical Genetics

## 2024-08-04 ENCOUNTER — Ambulatory Visit
Admission: RE | Admit: 2024-08-04 | Discharge: 2024-08-04 | Disposition: A | Discharge status: Home / Self Care | Source: Ambulatory Visit | Attending: Adult Health | Admitting: Adult Health

## 2024-08-04 DIAGNOSIS — Z1231 Encounter for screening mammogram for malignant neoplasm of breast: Secondary | ICD-10-CM | POA: Diagnosis not present

## 2024-08-24 ENCOUNTER — Ambulatory Visit: Payer: Self-pay

## 2024-08-24 NOTE — Telephone Encounter (Signed)
 FYI Only or Action Required?: FYI only for provider.  Patient was last seen in primary care on 03/10/2024 by Luke Chiquita SAUNDERS, DO.  Called Nurse Triage reporting Shoulder Pain.  Symptoms began several weeks ago.  Interventions attempted: Nothing.  Symptoms are: unchanged.  Triage Disposition: See PCP When Office is Open (Within 3 Days)  Patient/caregiver understands and will follow disposition?: Yes  **Appt. Scheduled for 10/14**       Copied from CRM #[id number]. Topic: Clinical - Red Word Triage >> Aug 24, 2024 11:48 AM Robinson H wrote: [facility] that prompted transfer to Nurse Triage: Exercising with weights and might have a right shoulder injury happened several weeks ago having discomfort Reason for Disposition  [1] MODERATE pain (e.g., interferes with normal activities) AND [2] present > 3 days  Answer Assessment - Initial Assessment Questions 1. ONSET: When did the pain start?     X 3 weeks ago   2. LOCATION: Where is the pain located?     Right shoulder   3. PAIN: How bad is the pain? (Scale 1-10; or mild, moderate, severe)     Movement makes the pain worse 6/10    4. WORK OR EXERCISE: Has there been any recent work or exercise that involved this part of the body?     Exercising   5. CAUSE: What do you think is causing the shoulder pain?     Exercising with trainer a few weeks ago, lifting weights   6. OTHER SYMPTOMS: Do you have any other symptoms? (e.g., neck pain, swelling, rash, fever, numbness, weakness)  Neck pain right side  For home care she is taking no medication at this time. Appt. Scheduled for 10/14  Protocols used: Shoulder Pain-A-AH

## 2024-08-25 ENCOUNTER — Ambulatory Visit (INDEPENDENT_AMBULATORY_CARE_PROVIDER_SITE_OTHER)
Admission: RE | Admit: 2024-08-25 | Discharge: 2024-08-25 | Disposition: A | Discharge status: Home / Self Care | Source: Ambulatory Visit | Attending: Adult Health | Admitting: Adult Health

## 2024-08-25 ENCOUNTER — Ambulatory Visit: Admitting: Adult Health

## 2024-08-25 VITALS — BP 130/76 | HR 67 | Temp 98.1°F | Ht 64.0 in | Wt 156.4 lb

## 2024-08-25 DIAGNOSIS — M47812 Spondylosis without myelopathy or radiculopathy, cervical region: Secondary | ICD-10-CM | POA: Diagnosis not present

## 2024-08-25 DIAGNOSIS — M25511 Pain in right shoulder: Secondary | ICD-10-CM

## 2024-08-25 DIAGNOSIS — M542 Cervicalgia: Secondary | ICD-10-CM | POA: Diagnosis not present

## 2024-08-25 DIAGNOSIS — T148XXA Other injury of unspecified body region, initial encounter: Secondary | ICD-10-CM

## 2024-08-25 DIAGNOSIS — M50323 Other cervical disc degeneration at C6-C7 level: Secondary | ICD-10-CM | POA: Diagnosis not present

## 2024-08-25 MED ORDER — NAPROXEN 500 MG PO TABS: 500.0000 mg | ORAL_TABLET | Freq: Two times a day (BID) | ORAL | 0 refills | Status: DC

## 2024-08-25 MED ORDER — CYCLOBENZAPRINE HCL 10 MG PO TABS: 10.0000 mg | ORAL_TABLET | Freq: Every day | ORAL | 0 refills | Status: DC

## 2024-08-25 NOTE — Patient Instructions (Addendum)
 Get xrays done at [facility] [address] [place]   I have sent in Flexeril  and Naprosyn for you  help

## 2024-08-25 NOTE — Progress Notes (Signed)
 Subjective:    Patient ID: Monique Miller, female    DOB: 11/25/1950, 73 y.o.   MRN: [id number]  Shoulder Pain    73 year old female who  has a past medical history of Allergy, Anemia, Anxiety, Arthritis, Cancer (HCC), Constipation by delayed colonic transit, Coronary artery disease, Depression, Dyspnea, Grover's disease, Headache, migraines, NSTEMI (non-ST elevated myocardial infarction) (HCC) (02/2016), Pneumonia, Restless leg syndrome, and Thyroid  disease.  Discussed the use of AI scribe software for clinical note transcription with the patient, who gave verbal consent to proceed.  History of Present Illness   Monique Miller is a 73 year old female who presents with right shoulder pain after starting an exercise regimen.  She has experienced right shoulder pain for three weeks, initiated during upper body exercises at the gym. The pain radiates to her neck and worsens with movements like reaching behind her back or extending her arm. She finds the pain uncomfortable and struggles with tasks such as removing an exercise bra, requiring assistance. She has not taken any analgesics. No imaging or diagnostic studies have been conducted        Review of Systems See HPI   Past Medical History:  Diagnosis Date   Allergy    Anemia    many years ago   Anxiety    Arthritis    Cancer (HCC)    skin cancer (basal cell)   Constipation by delayed colonic transit    Coronary artery disease    Depression    see [facility] Engineer, production and [facility]   Dyspnea    occ   Grover's disease    Headache    Hx of migraines    NSTEMI (non-ST elevated myocardial infarction) (HCC) 02/2016   Pneumonia    walking pneumonia   Restless leg syndrome    Thyroid  disease    hx of hypothyroid    Social History   Socioeconomic History   Marital status: Married    Spouse name: Not on file   Number of children: 2   Years of education: Not on file   Highest education level: Master's degree (e.g., MA, MS, MEng,  MEd, MSW, MBA)  Occupational History   Not on file  Tobacco Use   Smoking status: Never   Smokeless tobacco: Never  Vaping Use   Vaping status: Never Used  Substance and Sexual Activity   Alcohol use: Yes    Alcohol/week: 7.0 standard drinks of alcohol    Types: 7 Glasses of wine per week    Comment: occassionally   Drug use: No   Sexual activity: Not on file  Other Topics Concern   Not on file  Social History Narrative   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    Social Drivers of Corporate investment banker Strain: Low Risk  (08/24/2024)   Overall Financial Resource Strain (CARDIA)    Difficulty of Paying Living Expenses: Not hard at all  Food Insecurity: No Food Insecurity (08/24/2024)   Hunger Vital Sign    Worried About Running Out of Food in the Last Year: Never true    Ran Out of Food in the Last Year: Never true  Transportation Needs: No Transportation Needs (08/24/2024)  PRAPARE - Administrator, Civil Service (Medical): No    Lack of Transportation (Non-Medical): No  Physical Activity: Sufficiently Active (08/24/2024)   Exercise Vital Sign    Days of Exercise per Week: 3 days    Minutes of Exercise per Session: 50 min  Stress: Stress Concern Present (08/24/2024)   [employer] of Occupational Health - Occupational Stress Questionnaire    Feeling of Stress: Rather much  Social Connections: Socially Integrated (08/24/2024)   Social Connection and Isolation Panel    Frequency of Communication with Friends and Family: More than three times a week    Frequency of Social Gatherings with Friends and Family: Once a week    Attends Religious Services: 1 to 4 times per year    Active Member of [employer] or Organizations: Yes    Attends Engineer, structural: More than 4  times per year    Marital Status: Married  Catering manager Violence: Not At Risk (10/23/2023)   Received from [facility]   HITS    Over the last 12 months how often did your partner physically hurt you?: Never    Over the last 12 months how often did your partner insult you or talk down to you?: Never    Over the last 12 months how often did your partner threaten you with physical harm?: Never    Over the last 12 months how often did your partner scream or curse at you?: Never    Past Surgical History:  Procedure Laterality Date   APPENDECTOMY     AUGMENTATION MAMMAPLASTY Bilateral    Patient doesn't remember date   BACK SURGERY     L4-L5   BREAST SURGERY     augmentation   CARDIAC CATHETERIZATION N/A 03/08/2016   Procedure: Left Heart Cath and Coronary Angiography;  Surgeon: Gordy Bergamo, MD;  Location: [facility] INVASIVE CV LAB;  Service: Cardiovascular;  Laterality: N/A;   CARDIAC CATHETERIZATION N/A 03/08/2016   Procedure: Coronary Stent Intervention;  Surgeon: Gordy Bergamo, MD;  Location: [facility] INVASIVE CV LAB;  Service: Cardiovascular;  Laterality: N/A;  Resolute 3.5x15   CARDIAC CATHETERIZATION N/A 03/08/2016   Procedure: Intravascular Ultrasound/IVUS;  Surgeon: Gordy Bergamo, MD;  Location: [facility] INVASIVE CV LAB;  Service: Cardiovascular;  Laterality: N/A;  RCA MID   CESAREAN SECTION     x2   COLONOSCOPY     CORONARY ANGIOGRAM  03/08/2016   CORONARY ANGIOPLASTY     DES LAD, DES D1 02/16/16 [place], [place]), DES RCA 03/08/16 [facility])   LAMINECTOMY WITH POSTERIOR LATERAL ARTHRODESIS LEVEL 2 N/A 03/07/2017   Procedure: Lumbar Four-Five, Lumbar Five-Sacral One Posterior Lateral Fusion with Removal of pedicle screws;  Surgeon: Alm GORMAN Molt, MD;  Location: [facility] OR;  Service: Neurosurgery;  Laterality: N/A;    Family History  Problem Relation Age of Onset   Cancer Mother        pancreatic   Congestive Heart Failure Father    Heart attack Brother        Had blockage, almost a HA   Heart disease  Brother    Cancer Maternal Uncle        pancreatic   Cancer Maternal Grandmother        pancreatic   Breast cancer Neg Hx     No Known Allergies  Current Outpatient Medications on File Prior to Visit  Medication Sig Dispense Refill   aspirin  81 MG tablet Take 81 mg by mouth daily.  buPROPion (WELLBUTRIN XL) 150 MG 24 hr tablet Take 150 mg by mouth daily.     calcium -vitamin D  (OSCAL WITH D) 250-125 MG-UNIT tablet Take 4 tablets by mouth daily. Pt is taking 2 tabs in the am and 2 tabs in the pm     celecoxib  (CELEBREX ) 200 MG capsule TAKE 1 CAPSULE BY MOUTH EVERY DAY 90 capsule 1   FLUoxetine  (PROZAC ) 40 MG capsule TAKE 2 CAPSULES (80 MG TOTAL) BY MOUTH EVERY EVENING. 180 capsule 1   Melatonin 10 MG TABS Take 10 mg by mouth at bedtime.     metFORMIN  (GLUCOPHAGE ) 500 MG tablet TAKE 1/2 TABLET BY MOUTH DAILY WITH BREAKFAST 45 tablet 1   metoprolol  succinate (TOPROL -XL) 25 MG 24 hr tablet TAKE 1 TABLET BY MOUTH EVERY DAY WITH OR IMMEDIATELY FOLLOWING A MEAL 90 tablet 3   niacin  (NIASPAN ) 1000 MG CR tablet TAKE 1 TABLET BY MOUTH TWICE A DAY 180 tablet 1   Probiotic Product (PROBIOTIC DAILY PO) Take 1 capsule by mouth daily.     ramipril  (ALTACE ) 2.5 MG capsule TAKE 1 CAPSULE BY MOUTH EVERY DAY 90 capsule 3   rosuvastatin  (CRESTOR ) 20 MG tablet TAKE 1 TABLET BY MOUTH EVERY DAY 90 tablet 3   No current facility-administered medications on file prior to visit.    BP 130/76   Pulse 67   Temp 98.1 F (36.7 C) (Oral)   Ht 5' 4 (1.626 m)   Wt 156 lb 6.4 oz (70.9 kg)   SpO2 97%   BMI 26.85 kg/m       Objective:   Physical Exam Vitals and nursing note reviewed.  Constitutional:      Appearance: Normal appearance.  Musculoskeletal:     Right shoulder: Tenderness and bony tenderness present. No crepitus. Decreased range of motion. Normal strength.       Back:     Comments: Pain when raising arm above 90 degrees and with empty can test. She was not able to perform back scratch  test   Skin:    General: Skin is warm and dry.  Neurological:     General: No focal deficit present.     Mental Status: She is alert and oriented to person, place, and time.     Cranial Nerves: Cranial nerves 2-12 are intact.     Sensory: Sensation is intact.     Motor: Motor function is intact. No weakness.  Psychiatric:        Mood and Affect: Mood normal.        Behavior: Behavior normal.        Thought Content: Thought content normal.        Judgment: Judgment normal.       Assessment & Plan:  1. Acute pain of right shoulder (Primary) - There is concern for rotator cuff injury but muscle strain may be contributing. Will get xray of right shoulder and cervical spine. Likely need advanced imaging and referral to Orthopedics. Send in Naprosyn and Flexeril  to help with discomfort.  - DG Shoulder Right; Future - naproxen (NAPROSYN) 500 MG tablet; Take 1 tablet (500 mg total) by mouth 2 (two) times daily with a meal.  Dispense: 30 tablet; Refill: 0 - cyclobenzaprine  (FLEXERIL ) 10 MG tablet; Take 1 tablet (10 mg total) by mouth at bedtime.  Dispense: 15 tablet; Refill: 0  2. Cervical spine pain  - DG Cervical Spine Complete; Future  3. Muscle strain  - naproxen (NAPROSYN) 500 MG tablet; Take 1 tablet (500  mg total) by mouth 2 (two) times daily with a meal.  Dispense: 30 tablet; Refill: 0 - cyclobenzaprine  (FLEXERIL ) 10 MG tablet; Take 1 tablet (10 mg total) by mouth at bedtime.  Dispense: 15 tablet; Refill: 0  Darleene Shape, NP  I personally spent a total of 31 minutes in the care of the patient today including preparing to see the patient, getting/reviewing separately obtained history, performing a medically appropriate exam/evaluation, placing orders, documenting clinical information in the EHR, and listening .

## 2024-08-28 ENCOUNTER — Other Ambulatory Visit: Payer: Self-pay | Admitting: Adult Health

## 2024-08-28 ENCOUNTER — Ambulatory Visit: Payer: Self-pay | Admitting: Adult Health

## 2024-08-28 DIAGNOSIS — M25511 Pain in right shoulder: Secondary | ICD-10-CM

## 2024-08-28 NOTE — Telephone Encounter (Signed)
 Copied from CRM [id number]. Topic: Clinical - Lab/Test Results >> Aug 28, 2024  8:17 AM Avram MATSU wrote: Reason for CRM: pt is aware of her test results and would like a MRI, please advise [phone]

## 2024-08-30 ENCOUNTER — Other Ambulatory Visit: Payer: Self-pay | Admitting: Adult Health

## 2024-09-03 ENCOUNTER — Encounter: Payer: Self-pay | Admitting: Adult Health

## 2024-09-04 ENCOUNTER — Other Ambulatory Visit: Payer: Self-pay | Admitting: Adult Health

## 2024-09-04 DIAGNOSIS — Z1231 Encounter for screening mammogram for malignant neoplasm of breast: Secondary | ICD-10-CM

## 2024-09-04 DIAGNOSIS — I10 Essential (primary) hypertension: Secondary | ICD-10-CM | POA: Diagnosis not present

## 2024-09-04 DIAGNOSIS — F325 Major depressive disorder, single episode, in full remission: Secondary | ICD-10-CM | POA: Diagnosis not present

## 2024-09-04 DIAGNOSIS — F419 Anxiety disorder, unspecified: Secondary | ICD-10-CM | POA: Diagnosis not present

## 2024-09-04 DIAGNOSIS — M1990 Unspecified osteoarthritis, unspecified site: Secondary | ICD-10-CM | POA: Diagnosis not present

## 2024-09-04 DIAGNOSIS — M545 Low back pain, unspecified: Secondary | ICD-10-CM | POA: Diagnosis not present

## 2024-09-04 DIAGNOSIS — E119 Type 2 diabetes mellitus without complications: Secondary | ICD-10-CM | POA: Diagnosis not present

## 2024-09-04 DIAGNOSIS — E785 Hyperlipidemia, unspecified: Secondary | ICD-10-CM | POA: Diagnosis not present

## 2024-09-04 DIAGNOSIS — I252 Old myocardial infarction: Secondary | ICD-10-CM | POA: Diagnosis not present

## 2024-09-04 DIAGNOSIS — M4800 Spinal stenosis, site unspecified: Secondary | ICD-10-CM | POA: Diagnosis not present

## 2024-09-07 ENCOUNTER — Other Ambulatory Visit

## 2024-09-08 ENCOUNTER — Encounter: Payer: Self-pay | Admitting: Adult Health

## 2024-09-08 ENCOUNTER — Other Ambulatory Visit

## 2024-09-08 DIAGNOSIS — M25511 Pain in right shoulder: Secondary | ICD-10-CM

## 2024-09-08 DIAGNOSIS — T148XXA Other injury of unspecified body region, initial encounter: Secondary | ICD-10-CM

## 2024-09-08 MED ORDER — NAPROXEN 500 MG PO TABS: 500.0000 mg | ORAL_TABLET | Freq: Two times a day (BID) | ORAL | 0 refills | Status: DC

## 2024-09-08 MED ORDER — CYCLOBENZAPRINE HCL 10 MG PO TABS: 10.0000 mg | ORAL_TABLET | Freq: Every day | ORAL | 0 refills | Status: DC

## 2024-09-08 NOTE — Telephone Encounter (Signed)
**Note De-identified  [place] Obfuscation** Please advise 

## 2024-09-22 ENCOUNTER — Ambulatory Visit
Admission: RE | Admit: 2024-09-22 | Discharge: 2024-09-22 | Disposition: A | Discharge status: Home / Self Care | Source: Ambulatory Visit | Attending: Adult Health | Admitting: Adult Health

## 2024-09-22 ENCOUNTER — Ambulatory Visit: Payer: Self-pay | Admitting: Adult Health

## 2024-09-22 DIAGNOSIS — M67813 Other specified disorders of tendon, right shoulder: Secondary | ICD-10-CM | POA: Diagnosis not present

## 2024-09-22 DIAGNOSIS — M25511 Pain in right shoulder: Secondary | ICD-10-CM

## 2024-09-23 ENCOUNTER — Encounter: Payer: Self-pay | Admitting: Adult Health

## 2024-09-23 DIAGNOSIS — M069 Rheumatoid arthritis, unspecified: Secondary | ICD-10-CM

## 2024-09-28 ENCOUNTER — Other Ambulatory Visit: Payer: Self-pay

## 2024-09-28 ENCOUNTER — Ambulatory Visit: Admitting: Sports Medicine

## 2024-09-28 ENCOUNTER — Encounter: Payer: Self-pay | Admitting: Sports Medicine

## 2024-09-28 DIAGNOSIS — G8929 Other chronic pain: Secondary | ICD-10-CM | POA: Diagnosis not present

## 2024-09-28 DIAGNOSIS — M25511 Pain in right shoulder: Secondary | ICD-10-CM | POA: Diagnosis not present

## 2024-09-28 DIAGNOSIS — M19011 Primary osteoarthritis, right shoulder: Secondary | ICD-10-CM

## 2024-09-28 DIAGNOSIS — M25411 Effusion, right shoulder: Secondary | ICD-10-CM

## 2024-09-28 DIAGNOSIS — M12811 Other specific arthropathies, not elsewhere classified, right shoulder: Secondary | ICD-10-CM

## 2024-09-28 MED ORDER — LIDOCAINE HCL 1 % IJ SOLN
2.0000 mL | INTRAMUSCULAR | Status: AC | PRN
  Administered 2024-09-28: 2 mL

## 2024-09-28 MED ORDER — METHYLPREDNISOLONE ACETATE 40 MG/ML IJ SUSP
80.0000 mg | INTRAMUSCULAR | Status: AC | PRN
  Administered 2024-09-28: 80 mg via INTRA_ARTICULAR

## 2024-09-28 MED ORDER — BUPIVACAINE HCL 0.25 % IJ SOLN
2.0000 mL | INTRAMUSCULAR | Status: AC | PRN
  Administered 2024-09-28: 2 mL via INTRA_ARTICULAR

## 2024-09-28 NOTE — Progress Notes (Signed)
 Monique Miller - 73 y.o. female MRN [id number]  Date of birth: May 17, 1951  Office Visit Note: Visit Date: 09/28/2024 PCP: Merna Huxley, NP Referred by: Merna Huxley, NP  Subjective: Chief Complaint  Patient presents with   Right Shoulder - Pain   HPI: Monique Miller is a pleasant 73 y.o. female who presents today for chronic right shoulder pain x 6 weeks.  Monique Miller had an incident at the gym lifting weights about 6 weeks ago when she was doing repetitive and overhead activity and remembers feeling a pop and pain in the shoulder.  Since this time she has had the hold from lifting and other physical activity given her pain.  She feels more pain over the lateral aspect of the shoulder, sometimes in the posterior aspect that does radiate down the deltoid.  There is no numbness or tingling however.  She does have pain and limitation with certain rotation and reaching of the shoulder.  She saw her PCP who obtained x-rays as well as a shoulder MRI and is here to review these further.  She has been using naproxen 500 mg twice daily but would rather not take this long-term.  She also trialed cyclobenzaprine  10 mg at nighttime but did not notice a big difference.  She has not yet been engaged in formalized physical therapy.  Lab Results  Component Value Date   HGBA1C 6.2 10/22/2023   Pertinent ROS were reviewed with the patient and found to be negative unless otherwise specified above in HPI.   Assessment & Plan: Visit Diagnoses:  1. Chronic right shoulder pain   2. Primary osteoarthritis, right shoulder   3. Rotator cuff arthropathy of right shoulder   4. Shoulder joint effusion, right    Plan: Impression is chronic right shoulder pain with onset of injury after lifting weights with overhead activity.  She does have some rotator cuff tendinosis and possibly a degree of bursal interstitial tearing but certainly no high-grade tearing.  She does however have a small effusion within the glenohumeral  joint, given her mechanism of injury and her examination today, I believe this is more so an exacerbation of her degenerative labral tearing, with some reciprocal rotator cuff arthropathy.  We discussed all treatment options including oral medication, physical therapy, injection therapy.  To help with her pain as well as reduce her shoulder effusion, we did proceed with ultrasound-guided glenohumeral joint injection, patient tolerated well.  Advised on postinjection protocol.  May continue ice/heat, Tylenol  and her Naprosyn for the next few days.  As her pain improves I would like her to come off of these.  She may discontinue cyclobenzaprine  altogether.  I would like to get her started in formalized physical therapy, referral sent to the [facility] location which is closer to her.  I will see her back in about 6 weeks to reevaluate.  Follow-up: Return in about 6 weeks (around 11/09/2024) for R-shoulder.   Meds & Orders: No orders of the defined types were placed in this encounter.   Orders Placed This Encounter  Procedures   US  Guided Needle Placement - No Linked Charges   Ambulatory referral to Physical Therapy     Procedures: Large Joint Inj: R glenohumeral on 09/28/2024 4:06 PM Indications: pain Details: 22 G 3.5 in needle, ultrasound-guided posterior approach Medications: 2 mL lidocaine  1 %; 2 mL bupivacaine  0.25 %; 80 mg methylPREDNISolone  acetate 40 MG/ML Outcome: tolerated well, no immediate complications  US -guided glenohumeral joint injection, Right shoulder After discussion on risks/benefits/indications, informed verbal  consent was obtained. A timeout was then performed. The patient was positioned lying lateral recumbent on examination table. The patient's shoulder was prepped with betadine and multiple alcohol swabs and utilizing ultrasound guidance, the patient's glenohumeral joint was identified on ultrasound. Using ultrasound guidance a 22-gauge, 3.5 inch needle with a mixture of  2:2:2 cc's lidocaine :bupivicaine:depomedrol was directed from a lateral to medial direction via in-plane technique into the glenohumeral joint with visualization of appropriate spread of injectate into the joint. Patient tolerated the procedure well without immediate complications.      Procedure, treatment alternatives, risks and benefits explained, specific risks discussed. Consent was given by the patient. Immediately prior to procedure a time out was called to verify the correct patient, procedure, equipment, support staff and site/side marked as required. Patient was prepped and draped in the usual sterile fashion.          Clinical History: No specialty comments available.  She reports that she has never smoked. She has never used smokeless tobacco.  Recent Labs    10/22/23 0837  HGBA1C 6.2    Objective:    Physical Exam  Gen: Well-appearing, in no acute distress; non-toxic CV: Well-perfused. Warm.  Resp: Breathing unlabored on room air; no wheezing. Psych: Fluid speech in conversation; appropriate affect; normal thought process  Ortho Exam - Right shoulder: There is a trace to mild glenohumeral joint effusion.  There is full flexion, there is pain and about 10 to 15 degrees less of abduction on the right side secondary to pain.  There is pain and mild weakness with resisted abduction.  There is preserved internal and external strength testing about the rotator cuff.  There is pain and irritation of the shoulder joint with flexion at the 90/90 position. + Hawkins impingement testing.   Imaging:  *I did independently review and interpret right shoulder x-ray from 08/25/2024 as well as right shoulder MRI from 09/22/2024 today during office visit.  X-ray showed no acute fracture, there is a downsloping acromion noted.  MRI, nonarthrogram study does reveal mild AC and glenohumeral joint arthritis with cartilage loss as well as irregularity, likely degenerative tearing of the  labrum.  There is insertional supraspinatus and crossover infraspinatus tendinosis with possible interstitial tearing but certainly no high-grade tearing or tendon retraction.  MR Shoulder Right Wo Contrast MR SHOULDER WITHOUT IV CONTRAST RIGHT  COMPARISON: None.  CLINICAL HISTORY: Chronic right shoulder pain. Labral tear is suspected.  PULSE SEQUENCES: Ax PD FS, Sag T2 FS, Cor T1 & COR T2 FS  FINDINGS:  Bones: There is mild AC joint arthrosis with slight hypertrophy mild reactive edema. Mild glenohumeral arthrosis is present. There is a small glenohumeral joint effusion.  Rotator cuff: There is mild insertional tendinosis of the supraspinatus and infraspinatus tendons. There is no significant partial or full-thickness tear. Subscapularis and teres minor tendons are intact.  Labrum and biceps tendon: Biceps tendon is unremarkable. There is mild blunting of the superior labrum and posterior superior labrum which is likely degenerative. No discrete labral tear is appreciated.  IMPRESSION: Mild-to-moderate AC joint arthrosis and mild glenohumeral arthrosis.  Mild insertional tendinosis of the supraspinatus and infraspinatus tendons without significant rotator cuff pathology.  Mild blunting and irregularity of the superior and posterior labrum without discrete labral tear.  Electronically signed by: Norleen Satchel MD 09/22/2024 11:16 AM EST RP Workstation: [id number]  Narrative & Impression  CLINICAL DATA:  Right shoulder pain for 3 weeks, no known injury   EXAM: RIGHT SHOULDER - 2+ VIEW  COMPARISON:  None Available.   FINDINGS: There is no evidence of fracture or dislocation. There is no evidence of arthropathy or other focal bone abnormality. Soft tissues are unremarkable.   IMPRESSION: No fracture or dislocation of the right shoulder. Joint spaces are preserved.     Electronically Signed   By: Marolyn JONETTA Jaksch M.D.   On: 08/25/2024 14:51   Past  Medical/Family/Surgical/Social History: Medications & Allergies reviewed per EMR, new medications updated. Patient Active Problem List   Diagnosis Date Noted   S/P lumbar spinal fusion 03/07/2017   Osteopenia 03/14/2016   Post PTCA 03/08/2016   CAD (coronary artery disease), native coronary artery 03/05/2016   Spinal stenosis of lumbar region at multiple levels 08/06/2014   Anxiety 10/02/2012   Depression 10/02/2012   Menopausal disorder 10/02/2012   Attention deficit disorder 10/02/2012   Past Medical History:  Diagnosis Date   Allergy    Anemia    many years ago   Anxiety    Arthritis    Cancer (HCC)    skin cancer (basal cell)   Constipation by delayed colonic transit    Coronary artery disease    Depression    see [facility] [employer] and Olam Hind   Dyspnea    occ   Grover's disease    Headache    Hx of migraines    NSTEMI (non-ST elevated myocardial infarction) (HCC) 02/2016   Pneumonia    walking pneumonia   Restless leg syndrome    Thyroid  disease    hx of hypothyroid   Family History  Problem Relation Age of Onset   Cancer Mother        pancreatic   Congestive Heart Failure Father    Heart attack Brother        Had blockage, almost a HA   Heart disease Brother    Cancer Maternal Uncle        pancreatic   Cancer Maternal Grandmother        pancreatic   Breast cancer Neg Hx    Past Surgical History:  Procedure Laterality Date   APPENDECTOMY     AUGMENTATION MAMMAPLASTY Bilateral    Patient doesn't remember date   BACK SURGERY     L4-L5   BREAST SURGERY     augmentation   CARDIAC CATHETERIZATION N/A 03/08/2016   Procedure: Left Heart Cath and Coronary Angiography;  Surgeon: Gordy Bergamo, MD;  Location: [facility] INVASIVE CV LAB;  Service: Cardiovascular;  Laterality: N/A;   CARDIAC CATHETERIZATION N/A 03/08/2016   Procedure: Coronary Stent Intervention;  Surgeon: Gordy Bergamo, MD;  Location: [facility] INVASIVE CV LAB;  Service: Cardiovascular;  Laterality: N/A;   Resolute 3.5x15   CARDIAC CATHETERIZATION N/A 03/08/2016   Procedure: Intravascular Ultrasound/IVUS;  Surgeon: Gordy Bergamo, MD;  Location: [facility] INVASIVE CV LAB;  Service: Cardiovascular;  Laterality: N/A;  RCA MID   CESAREAN SECTION     x2   COLONOSCOPY     CORONARY ANGIOGRAM  03/08/2016   CORONARY ANGIOPLASTY     DES LAD, DES D1 02/16/16 Dorisann, [place]), DES RCA 03/08/16 [facility])   LAMINECTOMY WITH POSTERIOR LATERAL ARTHRODESIS LEVEL 2 N/A 03/07/2017   Procedure: Lumbar Four-Five, Lumbar Five-Sacral One Posterior Lateral Fusion with Removal of pedicle screws;  Surgeon: Alm GORMAN Molt, MD;  Location[facility] [place];  Service: Neurosurgery;  Laterality: N/A;   Social History   Occupational History   Not on file  Tobacco Use   Smoking status: Never   Smokeless tobacco: Never  Vaping Use  Vaping status: Never Used  Substance and Sexual Activity   Alcohol use: Yes    Alcohol/week: 7.0 standard drinks of alcohol    Types: 7 Glasses of wine per week    Comment: occassionally   Drug use: No   Sexual activity: Not on file

## 2024-09-28 NOTE — Progress Notes (Signed)
 Patient says that she was lifting weights about 6 weeks ago when she initially felt a pop and began having shoulder pain. She points over her deltoid and down the arm when describing her pain. She is able to reach overhead, although feels limited due to pain; her pain is much worse and her motion is much more limited with rotation of the shoulder. She denies any pain, numbness, or tingling that goes down the arm to the hand. She denies any popping or clicking in the time since her initial onset of pain. She was taking pain medication, although she has stopped taking it.

## 2024-09-30 ENCOUNTER — Encounter: Payer: Self-pay | Admitting: Physical Therapy

## 2024-09-30 ENCOUNTER — Ambulatory Visit: Attending: Sports Medicine | Admitting: Physical Therapy

## 2024-09-30 ENCOUNTER — Other Ambulatory Visit: Payer: Self-pay

## 2024-09-30 DIAGNOSIS — M12811 Other specific arthropathies, not elsewhere classified, right shoulder: Secondary | ICD-10-CM | POA: Diagnosis not present

## 2024-09-30 DIAGNOSIS — M25511 Pain in right shoulder: Secondary | ICD-10-CM | POA: Diagnosis not present

## 2024-09-30 DIAGNOSIS — M19011 Primary osteoarthritis, right shoulder: Secondary | ICD-10-CM | POA: Insufficient documentation

## 2024-09-30 DIAGNOSIS — G8929 Other chronic pain: Secondary | ICD-10-CM | POA: Diagnosis not present

## 2024-09-30 DIAGNOSIS — M6281 Muscle weakness (generalized): Secondary | ICD-10-CM | POA: Diagnosis not present

## 2024-09-30 NOTE — Therapy (Signed)
 OUTPATIENT PHYSICAL THERAPY SHOULDER EVALUATION   Patient Name: Monique Miller MRN: [id number] DOB:03-28-1951, 73 y.o., female Today's Date: 09/30/2024  END OF SESSION:  PT End of Session - 09/30/24 1408     Visit Number 1    Number of Visits 16    Date for Recertification  11/25/24    Authorization Type [employer]    Progress Note Due on Visit 10    PT Start Time 1145    PT Stop Time 1220    PT Time Calculation (min) 35 min    Activity Tolerance Patient tolerated treatment well    Behavior During Therapy WFL for tasks assessed/performed          Past Medical History:  Diagnosis Date   Allergy    Anemia    many years ago   Anxiety    Arthritis    Cancer (HCC)    skin cancer (basal cell)   Constipation by delayed colonic transit    Coronary artery disease    Depression    see [facility] [employer] and [facility]lam [facility]   Dyspnea    occ   Grover's disease    Headache    Hx of migraines    NSTEMI (non-ST elevated myocardial infarction) (HCC) 02/2016   Pneumonia    walking pneumonia   Restless leg syndrome    Thyroid  disease    hx of hypothyroid   Past Surgical History:  Procedure Laterality Date   APPENDECTOMY     AUGMENTATION MAMMAPLASTY Bilateral    Patient doesn't remember date   BACK SURGERY     L4-L5   BREAST SURGERY     augmentation   CARDIAC CATHETERIZATION N/A 03/08/2016   Procedure: Left Heart Cath and Coronary Angiography;  Surgeon: Gordy Bergamo, MD;  Location: [facility] INVASIVE CV LAB;  Service: Cardiovascular;  Laterality: N/A;   CARDIAC CATHETERIZATION N/A 03/08/2016   Procedure: Coronary Stent Intervention;  Surgeon: Gordy Bergamo, MD;  Location: [facility] INVASIVE CV LAB;  Service: Cardiovascular;  Laterality: N/A;  Resolute 3.5x15   CARDIAC CATHETERIZATION N/A 03/08/2016   Procedure: Intravascular Ultrasound/IVUS;  Surgeon: Gordy Bergamo, MD;  Location: [facility] INVASIVE CV LAB;  Service: Cardiovascular;  Laterality: N/A;  RCA MID   CESAREAN SECTION     x2   COLONOSCOPY      CORONARY ANGIOGRAM  03/08/2016   CORONARY ANGIOPLASTY     DES LAD, DES D1 02/16/16 Dorisann, [place]), DES RCA 03/08/16 [facility])   LAMINECTOMY WITH POSTERIOR LATERAL ARTHRODESIS LEVEL 2 N/A 03/07/2017   Procedure: Lumbar Four-Five, Lumbar Five-Sacral One Posterior Lateral Fusion with Removal of pedicle screws;  Surgeon: Alm GORMAN Molt, MD;  Location: [facility] OR;  Service: Neurosurgery;  Laterality: N/A;   Patient Active Problem List   Diagnosis Date Noted   S/P lumbar spinal fusion 03/07/2017   Osteopenia 03/14/2016   Post PTCA 03/08/2016   CAD (coronary artery disease), native coronary artery 03/05/2016   Spinal stenosis of lumbar region at multiple levels 08/06/2014   Anxiety 10/02/2012   Depression 10/02/2012   Menopausal disorder 10/02/2012   Attention deficit disorder 10/02/2012    PCP: Merna Huxley  REFERRING PROVIDER: Burnetta Brunet  REFERRING DIAG: chronic Rt shoulder pain  THERAPY DIAG:  Muscle weakness (generalized)  Chronic right shoulder pain  Rationale for Evaluation and Treatment: Rehabilitation  ONSET DATE: 08/2024  SUBJECTIVE:  SUBJECTIVE STATEMENT: Pt was lifting wieghts overhead at the gym and she felt a pop in her Rt shoulder. She had pain and saw MD who gave her an injection which she states helped a little. She states she has increased pain with reaching behind her back and when lifting objects. Occasionally her shoulder hurts at night. She has not returned to weight lifting at the gym. Meds do help a little with pain. Hand dominance: Right  PERTINENT HISTORY: MI 2016  PAIN:  Are you having pain? Yes: NPRS scale: 2/10 currently, up to 7/10 at worst Pain location: Rt shoulder Pain description: sore Aggravating factors: reach behind back, lift Relieving factors: meds,  injection  PRECAUTIONS: None  RED FLAGS: None   WEIGHT BEARING RESTRICTIONS: No  FALLS:  Has patient fallen in last 6 months? No    OCCUPATION: Retired - enjoys going to [employer], reading, travel  PLOF: Independent  PATIENT GOALS:decrease pain  NEXT MD VISIT:   OBJECTIVE:  Note: Objective measures were completed at Evaluation unless otherwise noted.  DIAGNOSTIC FINDINGS:  MRI 09/22/24: Mild-to-moderate AC joint arthrosis and mild glenohumeral arthrosis.   Mild insertional tendinosis of the supraspinatus and infraspinatus tendons without significant rotator cuff pathology.   Mild blunting and irregularity of the superior and posterior labrum without discrete labral tear.  PATIENT SURVEYS:  Quick Dash: 52.3    COGNITION: Overall cognitive status: Within functional limits for tasks assessed      POSTURE: Rounded shoulders  UPPER EXTREMITY ROM:   Active ROM Right eval Left eval  Shoulder flexion 121 WFL  Shoulder extension    Shoulder abduction 85 pain WFL  Shoulder adduction    Shoulder internal rotation To Rt glute - pain   Shoulder external rotation 55 WFL  Elbow flexion    Elbow extension    Wrist flexion    Wrist extension    Wrist ulnar deviation    Wrist radial deviation    Wrist pronation    Wrist supination    (Blank rows = not tested)  UPPER EXTREMITY MMT:  MMT Right eval Left eval  Shoulder flexion 3+ 3+  Shoulder extension    Shoulder abduction 3 pain 3+  Shoulder adduction    Shoulder internal rotation 3+ 3+  Shoulder external rotation 3+ 3+  Middle trapezius    Lower trapezius    Elbow flexion 4- 4  Elbow extension 4- 4  Wrist flexion    Wrist extension    Wrist ulnar deviation    Wrist radial deviation    Wrist pronation    Wrist supination    Grip strength (lbs)    (Blank rows = not tested)   JOINT MOBILITY TESTING:  GH jt mobs WFL all directions  PALPATION:  TTP Rt superior and lateral shoulder  TREATMENT DATE: 09/30/24 See HEP Pt educated on PT POC and goals, HEP, rationale for treatment, relevant anatomy   PATIENT EDUCATION: Education details: PT POC and goals, HEP Person educated: Patient Education method: Explanation, Demonstration, and Handouts Education comprehension: verbalized understanding and returned demonstration  HOME EXERCISE PROGRAM: Access Code: [email or web address] URL: [email or web address] Date: 09/30/2024 Prepared by: Darice Conine  Exercises - Standing Shoulder Internal Rotation Stretch with Towel  - 1 x daily - 7 x weekly - 1 sets - 10 reps - 10 seconds hold - Standing Shoulder Abduction AAROM with Dowel  - 1 x daily - 7 x weekly - 1 sets - 10 reps - 10 seconds hold - Standing Shoulder External Rotation AAROM with Dowel  - 1 x daily - 7 x weekly - 1 sets - 10 reps - 10 seconds hold - Isometric Shoulder Abduction at Wall  - 1 x daily - 7 x weekly - 1 sets - 10 reps - 3 seconds hold  ASSESSMENT:  CLINICAL IMPRESSION: Patient is a 73 y.o. female who was seen today for physical therapy evaluation and treatment for chronic Rt shoulder pain. Pt presents with decreased AROM and strength, increased pain and decreased functional mobility. Pt will benefit from skilled PT to address deficits and improve functional mobility with decreased pain.   OBJECTIVE IMPAIRMENTS: decreased activity tolerance, decreased ROM, decreased strength, impaired UE functional use, and pain.   ACTIVITY LIMITATIONS: carrying, lifting, and reach over head  PARTICIPATION LIMITATIONS: meal prep, cleaning, and community activity  PERSONAL FACTORS: Time since onset of injury/illness/exacerbation are also affecting patient's functional outcome.   REHAB POTENTIAL: Good  CLINICAL DECISION MAKING: Evolving/moderate complexity  EVALUATION COMPLEXITY:  Moderate   GOALS: Goals reviewed with patient? Yes  SHORT TERM GOALS: Target date: 10/28/2024    Pt will be independent with initial HEP Baseline: Goal status: INITIAL  2.  Pt will improve Rt shoulder abduction AROM to >= 115 to improve functional mobility Baseline:  Goal status: INITIAL   LONG TERM GOALS: Target date: 11/25/2024    Pt will be independent with advanced HEP including return to lifting activities at the gym Baseline:  Goal status: INITIAL  2.  Quick Hollis will improve to <= 46 to demo improved functional mobility Baseline:  Goal status: INITIAL  3.  Pt will improve Rt shoulder internal rotation to be able to fasten bra without increased pain Baseline:  Goal status: INITIAL  4.  Pt will tolerate performing light household chores with pain <= 2/10 Baseline: 5/10 Goal status: INITIAL  5.  Pt will improve UE strength to 4+/5 to improve functional activity tolerance Baseline:  Goal status: INITIAL    PLAN:  PT FREQUENCY: 1-2x/week  PT DURATION: 8 weeks  PLANNED INTERVENTIONS: 97164- PT Re-evaluation, 97110-Therapeutic exercises, 97530- Therapeutic activity, [id number]- Neuromuscular re-education, 97535- Self Care, [id number]- Manual therapy, [id number]- Electrical stimulation (unattended), [phone]- Ionotophoresis 4mg /ml Dexamethasone , 79439 (1-2 muscles), 20561 (3+ muscles)- Dry Needling, Patient/Family education, Taping, Cryotherapy, and Moist heat  PLAN FOR NEXT SESSION: assess response to HEP, UE strength as tolerated, AAROM or AROM   Yona Stansbury, PT 09/30/2024, 2:09 PM  Referring diagnosis? Chronic Rt shoulder pain Treatment diagnosis? (if different than referring diagnosis) muscle weakness, chronic Rt shoulder pain What was this (referring dx) caused by? []  Surgery []  Fall [x]  Ongoing issue []  Arthritis []  Other: ____________  Laterality: [x]  Rt []  Lt []  Both  Check all possible CPT codes:  *CHOOSE 10 OR LESS*    See Planned Interventions  listed in the  Plan section of the Evaluation.

## 2024-10-06 ENCOUNTER — Ambulatory Visit: Payer: Self-pay | Admitting: Rehabilitative and Restorative Service Providers"

## 2024-10-13 ENCOUNTER — Encounter: Payer: Self-pay | Admitting: Physical Therapy

## 2024-10-13 ENCOUNTER — Ambulatory Visit: Payer: Self-pay | Attending: Adult Health | Admitting: Physical Therapy

## 2024-10-13 ENCOUNTER — Other Ambulatory Visit (HOSPITAL_COMMUNITY)

## 2024-10-13 DIAGNOSIS — G8929 Other chronic pain: Secondary | ICD-10-CM | POA: Diagnosis present

## 2024-10-13 DIAGNOSIS — M25511 Pain in right shoulder: Secondary | ICD-10-CM | POA: Diagnosis present

## 2024-10-13 DIAGNOSIS — M6281 Muscle weakness (generalized): Secondary | ICD-10-CM | POA: Insufficient documentation

## 2024-10-13 NOTE — Therapy (Signed)
 OUTPATIENT PHYSICAL THERAPY SHOULDER TREATMENT AND DISCHARGE   Patient Name: Monique Miller MRN: [id number] DOB:1951-05-22, 73 y.o., female Today's Date: 10/13/2024  END OF SESSION:  PT End of Session - 10/13/24 1129     Visit Number 2    Number of Visits 16    Date for Recertification  11/25/24    Authorization Type [employer]    Progress Note Due on Visit 10    PT Start Time 1105    PT Stop Time 1128    PT Time Calculation (min) 23 min    Activity Tolerance Patient tolerated treatment well    Behavior During Therapy WFL for tasks assessed/performed           Past Medical History:  Diagnosis Date   Allergy    Anemia    many years ago   Anxiety    Arthritis    Cancer (HCC)    skin cancer (basal cell)   Constipation by delayed colonic transit    Coronary artery disease    Depression    see [facility] and [facility]   Dyspnea    occ   Grover's disease    Headache    Hx of migraines    NSTEMI (non-ST elevated myocardial infarction) (HCC) 02/2016   Pneumonia    walking pneumonia   Restless leg syndrome    Thyroid  disease    hx of hypothyroid   Past Surgical History:  Procedure Laterality Date   APPENDECTOMY     AUGMENTATION MAMMAPLASTY Bilateral    Patient doesn't remember date   BACK SURGERY     L4-L5   BREAST SURGERY     augmentation   CARDIAC CATHETERIZATION N/A 03/08/2016   Procedure: Left Heart Cath and Coronary Angiography;  Surgeon: Gordy Bergamo, MD;  Location: [facility] INVASIVE CV LAB;  Service: Cardiovascular;  Laterality: N/A;   CARDIAC CATHETERIZATION N/A 03/08/2016   Procedure: Coronary Stent Intervention;  Surgeon: Gordy Bergamo, MD;  Location: [facility] INVASIVE CV LAB;  Service: Cardiovascular;  Laterality: N/A;  Resolute 3.5x15   CARDIAC CATHETERIZATION N/A 03/08/2016   Procedure: Intravascular Ultrasound/IVUS;  Surgeon: Gordy Bergamo, MD;  Location: [facility] INVASIVE CV LAB;  Service: Cardiovascular;  Laterality: N/A;  RCA MID   CESAREAN SECTION     x2    COLONOSCOPY     CORONARY ANGIOGRAM  03/08/2016   CORONARY ANGIOPLASTY     DES LAD, DES D1 02/16/16 [facility] [place]), DES RCA 03/08/16 [facility])   LAMINECTOMY WITH POSTERIOR LATERAL ARTHRODESIS LEVEL 2 N/A 03/07/2017   Procedure: Lumbar Four-Five, Lumbar Five-Sacral One Posterior Lateral Fusion with Removal of pedicle screws;  Surgeon: Alm GORMAN Molt, MD;  Location: [facility] OR;  Service: Neurosurgery;  Laterality: N/A;   Patient Active Problem List   Diagnosis Date Noted   S/P lumbar spinal fusion 03/07/2017   Osteopenia 03/14/2016   Post PTCA 03/08/2016   CAD (coronary artery disease), native coronary artery 03/05/2016   Spinal stenosis of lumbar region at multiple levels 08/06/2014   Anxiety 10/02/2012   Depression 10/02/2012   Menopausal disorder 10/02/2012   Attention deficit disorder 10/02/2012    PCP: Merna Huxley  REFERRING PROVIDER: Burnetta Brunet  REFERRING DIAG: chronic Rt shoulder pain  THERAPY DIAG:  Muscle weakness (generalized)  Chronic right shoulder pain  Rationale for Evaluation and Treatment: Rehabilitation  ONSET DATE: 08/2024  SUBJECTIVE:  SUBJECTIVE STATEMENT: Pt states she went on a vacation and was not able to take her meds so her shoulder was more sore. When she came home she resumed meds and states she has felt a lot better. Pt states I don't want to do PT, I'm not a person who does physical therapy Pt states she feels ready to d/c Hand dominance: Right  PERTINENT HISTORY: MI 2016  Pt was lifting wieghts overhead at the gym and she felt a pop in her Rt shoulder. She had pain and saw MD who gave her an injection which she states helped a little. She states she has increased pain with reaching behind her back and when lifting objects. Occasionally her shoulder hurts at night. She  has not returned to weight lifting at the gym. Meds do help a little with pain.  PAIN:  Are you having pain? Yes: NPRS scale: 0/10 currently, up to 1/10 at worst Pain location: Rt shoulder Pain description: sore Aggravating factors: reach behind back, lift Relieving factors: meds, injection  PRECAUTIONS: None  RED FLAGS: None   WEIGHT BEARING RESTRICTIONS: No  FALLS:  Has patient fallen in last 6 months? No    OCCUPATION: Retired - enjoys going to [employer], reading, travel  PLOF: Independent  PATIENT GOALS:decrease pain  NEXT MD VISIT:   OBJECTIVE:  Note: Objective measures were completed at Evaluation unless otherwise noted.  DIAGNOSTIC FINDINGS:  MRI 09/22/24: Mild-to-moderate AC joint arthrosis and mild glenohumeral arthrosis.   Mild insertional tendinosis of the supraspinatus and infraspinatus tendons without significant rotator cuff pathology.   Mild blunting and irregularity of the superior and posterior labrum without discrete labral tear.  PATIENT SURVEYS:  Quick Dash: 52.3    COGNITION: Overall cognitive status: Within functional limits for tasks assessed      POSTURE: Rounded shoulders  UPPER EXTREMITY ROM:   Active ROM Right eval Left eval Right 10/13/24  Shoulder flexion 121 WFL 160  Shoulder extension     Shoulder abduction 85 pain WFL 155  Shoulder adduction     Shoulder internal rotation To Rt glute - pain    Shoulder external rotation 55 WFL 70  Elbow flexion     Elbow extension     Wrist flexion     Wrist extension     Wrist ulnar deviation     Wrist radial deviation     Wrist pronation     Wrist supination     (Blank rows = not tested)  UPPER EXTREMITY MMT:  MMT Right eval Left eval Rt/Lt 10/13/24  Shoulder flexion 3+ 3+ 4/4  Shoulder extension     Shoulder abduction 3 pain 3+ 4/4 - discomfort on Rt  Shoulder adduction     Shoulder internal rotation 3+ 3+ 4-/4-  Shoulder external rotation 3+ 3+ 4-/4-  Middle  trapezius     Lower trapezius     Elbow flexion 4- 4 4+/4+  Elbow extension 4- 4 4+/4+  Wrist flexion     Wrist extension     Wrist ulnar deviation     Wrist radial deviation     Wrist pronation     Wrist supination     Grip strength (lbs)     (Blank rows = not tested)   JOINT MOBILITY TESTING:  GH jt mobs WFL all directions  PALPATION:  TTP Rt superior and lateral shoulder  [facility] Adult PT Treatment:                                                DATE: 10/13/24 Therapeutic Exercise: ROM and MMT (see above) Shoulder press Rt UE x 5 3#,5#,7# - no pain  Therapeutic Activity: Quick Dash = 9.09 Discussion of recommendations for d/c, ramp up to gym program   TREATMENT DATE: 09/30/24 See HEP Pt educated on PT POC and goals, HEP, rationale for treatment, relevant anatomy   PATIENT EDUCATION: Education details: PT POC and goals, HEP Person educated: Patient Education method: Explanation, Demonstration, and Handouts Education comprehension: verbalized understanding and returned demonstration  HOME EXERCISE PROGRAM: Access Code: [email or web address] URL: [email or web address] Date: 09/30/2024 Prepared by: Darice Conine  Exercises - Standing Shoulder Internal Rotation Stretch with Towel  - 1 x daily - 7 x weekly - 1 sets - 10 reps - 10 seconds hold - Standing Shoulder Abduction AAROM with Dowel  - 1 x daily - 7 x weekly - 1 sets - 10 reps - 10 seconds hold - Standing Shoulder External Rotation AAROM with Dowel  - 1 x daily - 7 x weekly - 1 sets - 10 reps - 10 seconds hold - Isometric Shoulder Abduction at Wall  - 1 x daily - 7 x weekly - 1 sets - 10 reps - 3 seconds hold  ASSESSMENT:  CLINICAL IMPRESSION: Session focused on reassessing goals and educating pt on recommendations for d/c. She has no pain and has met goals and feels ready to d/c at this  time  OBJECTIVE IMPAIRMENTS: decreased activity tolerance, decreased ROM, decreased strength, impaired UE functional use, and pain.    GOALS: Goals reviewed with patient? Yes  SHORT TERM GOALS: Target date: 10/28/2024    Pt will be independent with initial HEP Baseline: Goal status: IN PROGRESS  2.  Pt will improve Rt shoulder abduction AROM to >= 115 to improve functional mobility Baseline:  Goal status: MET   LONG TERM GOALS: Target date: 11/25/2024    Pt will be independent with advanced HEP including return to lifting activities at the gym Baseline:  Goal status: IN PROGRESS  2.  Quick Hollis will improve to <= 46 to demo improved functional mobility Baseline:  Goal status: MET  3.  Pt will improve Rt shoulder internal rotation to be able to fasten bra without increased pain Baseline:  Goal status: MET  4.  Pt will tolerate performing light household chores with pain <= 2/10 Baseline: 5/10 Goal status: MET  5.  Pt will improve UE strength to 4+/5 to improve functional activity tolerance Baseline:  Goal status: IN PROGRESS    PLAN:  PT FREQUENCY: 1-2x/week  PT DURATION: 8 weeks  PLANNED INTERVENTIONS: 02835- PT Re-evaluation, 97110-Therapeutic exercises, 97530- Therapeutic activity, 97112- Neuromuscular re-education, 97535- Self Care, 02859- Manual therapy, [id number]- Electrical stimulation (unattended), [phone]- Ionotophoresis 4mg /ml Dexamethasone , 79439 (1-2 muscles), 20561 (3+ muscles)- Dry Needling, Patient/Family education, Taping, Cryotherapy, and Moist heat  PLAN FOR NEXT SESSION: d/c   Ibeth Fahmy, PT 10/13/2024, 11:30 AM  PHYSICAL THERAPY DISCHARGE SUMMARY  Visits from Start of Care: 2  Current functional level related to goals / functional outcomes: Decreased pain, improved ROM and strength   Remaining deficits: See above   Education / Equipment: HEP   Patient agrees to discharge. Patient goals were partially met. Patient is being  discharged  due to being pleased with the current functional level.

## 2024-10-20 ENCOUNTER — Ambulatory Visit: Payer: Self-pay | Admitting: Physical Therapy

## 2024-10-20 ENCOUNTER — Ambulatory Visit: Admitting: Adult Health

## 2024-11-16 ENCOUNTER — Ambulatory Visit: Admitting: Sports Medicine

## 2024-11-25 ENCOUNTER — Encounter: Payer: Self-pay | Admitting: Adult Health

## 2024-11-25 ENCOUNTER — Ambulatory Visit: Admitting: Adult Health

## 2024-11-25 VITALS — BP 118/62 | HR 66 | Temp 98.2°F | Ht 64.0 in | Wt 158.0 lb

## 2024-11-25 DIAGNOSIS — R7303 Prediabetes: Secondary | ICD-10-CM

## 2024-11-25 DIAGNOSIS — Z0000 Encounter for general adult medical examination without abnormal findings: Secondary | ICD-10-CM

## 2024-11-25 DIAGNOSIS — R4189 Other symptoms and signs involving cognitive functions and awareness: Secondary | ICD-10-CM | POA: Diagnosis not present

## 2024-11-25 DIAGNOSIS — I25118 Atherosclerotic heart disease of native coronary artery with other forms of angina pectoris: Secondary | ICD-10-CM

## 2024-11-25 DIAGNOSIS — G8929 Other chronic pain: Secondary | ICD-10-CM

## 2024-11-25 DIAGNOSIS — I10 Essential (primary) hypertension: Secondary | ICD-10-CM | POA: Diagnosis not present

## 2024-11-25 DIAGNOSIS — F3342 Major depressive disorder, recurrent, in full remission: Secondary | ICD-10-CM | POA: Diagnosis not present

## 2024-11-25 DIAGNOSIS — M8589 Other specified disorders of bone density and structure, multiple sites: Secondary | ICD-10-CM

## 2024-11-25 DIAGNOSIS — M545 Low back pain, unspecified: Secondary | ICD-10-CM

## 2024-11-25 DIAGNOSIS — R2681 Unsteadiness on feet: Secondary | ICD-10-CM | POA: Diagnosis not present

## 2024-11-25 DIAGNOSIS — F419 Anxiety disorder, unspecified: Secondary | ICD-10-CM

## 2024-11-25 LAB — URINALYSIS, ROUTINE W REFLEX MICROSCOPIC
Bilirubin Urine: NEGATIVE
Hgb urine dipstick: NEGATIVE
Ketones, ur: NEGATIVE
Nitrite: POSITIVE — AB
RBC / HPF: NONE SEEN
Specific Gravity, Urine: 1.015 (ref 1.000–1.030)
Total Protein, Urine: NEGATIVE
Urine Glucose: NEGATIVE
Urobilinogen, UA: 0.2 (ref 0.0–1.0)
pH: 7 (ref 5.0–8.0)

## 2024-11-25 LAB — LIPID PANEL
Cholesterol: 137 mg/dL (ref 28–200)
HDL: 60.6 mg/dL
LDL Cholesterol: 68 mg/dL (ref 10–99)
NonHDL: 76.66
Total CHOL/HDL Ratio: 2
Triglycerides: 44 mg/dL (ref 10.0–149.0)
VLDL: 8.8 mg/dL (ref 0.0–40.0)

## 2024-11-25 LAB — HEMOGLOBIN A1C: Hgb A1c MFr Bld: 6 % (ref 4.6–6.5)

## 2024-11-25 LAB — COMPREHENSIVE METABOLIC PANEL WITH GFR
ALT: 21 U/L (ref 3–35)
AST: 29 U/L (ref 5–37)
Albumin: 4.2 g/dL (ref 3.5–5.2)
Alkaline Phosphatase: 49 U/L (ref 39–117)
BUN: 18 mg/dL (ref 6–23)
CO2: 30 meq/L (ref 19–32)
Calcium: 9.1 mg/dL (ref 8.4–10.5)
Chloride: 103 meq/L (ref 96–112)
Creatinine, Ser: 1.07 mg/dL (ref 0.40–1.20)
GFR: 52.38 mL/min — ABNORMAL LOW
Glucose, Bld: 87 mg/dL (ref 70–99)
Potassium: 4.7 meq/L (ref 3.5–5.1)
Sodium: 138 meq/L (ref 135–145)
Total Bilirubin: 0.6 mg/dL (ref 0.2–1.2)
Total Protein: 6.8 g/dL (ref 6.0–8.3)

## 2024-11-25 LAB — VITAMIN B12: Vitamin B-12: 321 pg/mL (ref 211–911)

## 2024-11-25 LAB — TSH: TSH: 2.85 u[IU]/mL (ref 0.35–5.50)

## 2024-11-25 LAB — VITAMIN D 25 HYDROXY (VIT D DEFICIENCY, FRACTURES): VITD: 41.85 ng/mL (ref 30.00–100.00)

## 2024-11-25 NOTE — Patient Instructions (Signed)
 It was great seeing you today   We will follow up with you regarding your lab work   Please let me know if you need anything   I am going to refer you to PT   I have also referred you to [facility] for mental health

## 2024-11-25 NOTE — Progress Notes (Signed)
 "  Subjective:    Patient ID: Monique Miller, female    DOB: Nov 02, 1951, 74 y.o.   MRN: [id number]  HPI Patient presents for yearly preventative medicine examination. She is a pleasant 74 year old female who  has a past medical history of Allergy, Anemia, Anxiety, Arthritis, Cancer (HCC), Constipation by delayed colonic transit, Coronary artery disease, Depression, Dyspnea, Grover's disease, Headache, migraines, NSTEMI (non-ST elevated myocardial infarction) (HCC) (02/2016), Pneumonia, Restless leg syndrome, and Thyroid  disease.  CAD-has a history of heart cath and coronary artery angioplasty in 2017.  She is currently prescribed Crestor  20 mg daily, aspirin  81 mg, metoprolol  25 mg ER.  She is managed by cardiology Lab Results  Component Value Date   CHOL 138 10/22/2023   HDL 59.90 10/22/2023   LDLCALC 63 10/22/2023   LDLDIRECT 115.5 11/09/2011   TRIG 75.0 10/22/2023   CHOLHDL 2 10/22/2023   Hypertension -managed with metoprolol  25 mg ER and ramipril  2.5 mg daily.  She denies dizziness, lightheadedness, chest pain, or shortness of breath BP Readings from Last 3 Encounters:  11/25/24 118/62  08/25/24 130/76  04/20/24 122/60   Anxiety/Depression -managed with Prozac  80 mg daily and Wellbutrin 300 mg daily.  She does feel like she is feeling better, her psychiatrist is online and she would like to have someone local as she does not feel like she has a personal interaction with someone online.   Prediabetes -managed with metformin  250 mg daily. Lab Results  Component Value Date   HGBA1C 6.2 10/22/2023   HGBA1C 6.3 10/18/2022   HGBA1C 6.2 10/20/2021   Osteopenia -history of osteopenia.  Her last bone density screen was in 2023 with a T-score of -2.3.  She does take Os-Cal on a daily basis  Chronic Low back pain - takes Celebrex  200 mg PRN   Gait Instability -  She has fallen 3 times in the last few months. She reports that one time she was walking outside and  it felt like my body stopped  but I kept going and fell forward hitting my head.  Another time I was going up a stair at my daughters house and tripped over the stair. At time she will get dizzy and lightheaded and feels as though this plays a roll in her gait instability. Some days her gait is really good and other times it is not. Her last fall was about a month ago.   Cognitive Impairment - she feels as though her memory is  terrible. It has been this way for awhile but she feels as though it is getting worse and family members have started to notice. She feels as though it is more her short term memory than long term memory. She is not getting lost while driving or forgetting where she places items. She does not forget family members names but cannot remember names of people she has recently met. She feels as though she has a hard time communicating and find words to express her needs or wants.    All immunizations and health maintenance protocols were reviewed with the patient and needed orders were placed. She is up to date on vaccinations   Appropriate screening laboratory values were ordered for the patient including screening of hyperlipidemia, renal function and hepatic function.  Medication reconciliation,  past medical history, social history, problem list and allergies were reviewed in detail with the patient  Goals were established with regard to weight loss, exercise, and  diet in compliance with medications  She  is up to date on routine colon cancer screening and mammograms. She is due for bone density screening     Review of Systems  Constitutional: Negative.   HENT: Negative.    Eyes: Negative.   Respiratory: Negative.    Cardiovascular: Negative.   Gastrointestinal: Negative.   Endocrine: Negative.   Genitourinary: Negative.   Musculoskeletal:  Positive for arthralgias, back pain and gait problem.  Skin: Negative.   Allergic/Immunologic: Negative.   Neurological:  Positive for dizziness, speech  difficulty and light-headedness. Negative for tremors, syncope, weakness, numbness and headaches.  Hematological: Negative.   Psychiatric/Behavioral: Negative.     Past Medical History:  Diagnosis Date   Allergy    Anemia    many years ago   Anxiety    Arthritis    Cancer (HCC)    skin cancer (basal cell)   Constipation by delayed colonic transit    Coronary artery disease    Depression    see [facility] and [facility]   Dyspnea    occ   Grover's disease    Headache    Hx of migraines    NSTEMI (non-ST elevated myocardial infarction) (HCC) 02/2016   Pneumonia    walking pneumonia   Restless leg syndrome    Thyroid  disease    hx of hypothyroid    Social History   Socioeconomic History   Marital status: Married    Spouse name: Not on file   Number of children: 2   Years of education: Not on file   Highest education level: Master's degree (e.g., MA, MS, MEng, MEd, MSW, MBA)  Occupational History   Not on file  Tobacco Use   Smoking status: Never   Smokeless tobacco: Never  Vaping Use   Vaping status: Never Used  Substance and Sexual Activity   Alcohol use: Yes    Alcohol/week: 7.0 standard drinks of alcohol    Types: 7 Glasses of wine per week    Comment: occassionally   Drug use: No   Sexual activity: Not on file  Other Topics Concern   Not on file  Social History Narrative   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    Social Drivers of Health   Tobacco Use: Low Risk (11/25/2024)   Patient History    Smoking Tobacco Use: Never    Smokeless Tobacco Use: Never    Passive Exposure: Not on file  Financial Resource Strain: Low Risk (08/24/2024)   Overall Financial Resource Strain (CARDIA)    Difficulty of Paying Living Expenses: Not hard at all  Food Insecurity: No Food  Insecurity (08/24/2024)   Epic    Worried About Programme Researcher, Broadcasting/film/video in the Last Year: Never true    Ran Out of Food in the Last Year: Never true  Transportation Needs: No Transportation Needs (08/24/2024)   Epic    Lack of Transportation (Medical): No    Lack of Transportation (Non-Medical): No  Physical Activity: Sufficiently Active (08/24/2024)   Exercise Vital Sign    Days of Exercise per Week: 3 days    Minutes of Exercise per Session: 50 min  Stress: Stress Concern Present (08/24/2024)   [employer] of Occupational Health - Occupational  Stress Questionnaire    Feeling of Stress: Rather much  Social Connections: Socially Integrated (08/24/2024)   Social Connection and Isolation Panel    Frequency of Communication with Friends and Family: More than three times a week    Frequency of Social Gatherings with Friends and Family: Once a week    Attends Religious Services: 1 to 4 times per year    Active Member of Clubs or Organizations: Yes    Attends Banker Meetings: More than 4 times per year    Marital Status: Married  Catering Manager Violence: Not At Risk (10/23/2023)   Received from [facility]   HITS    Over the last 12 months how often did your partner physically hurt you?: Never    Over the last 12 months how often did your partner insult you or talk down to you?: Never    Over the last 12 months how often did your partner threaten you with physical harm?: Never    Over the last 12 months how often did your partner scream or curse at you?: Never  Depression (PHQ2-9): Medium Risk (11/25/2024)   Depression (PHQ2-9)    PHQ-2 Score: 6  Alcohol Screen: Low Risk (08/24/2024)   Alcohol Screen    Last Alcohol Screening Score (AUDIT): 4  Housing: Unknown (08/24/2024)   Epic    Unable to Pay for Housing in the Last Year: No    Number of Times Moved in the Last Year: Not on file    Homeless in the Last Year: No  Utilities: Not At Risk (10/23/2023)   Received  from [facility] Utilities    Threatened with loss of utilities: No  Health Literacy: Not on file    Past Surgical History:  Procedure Laterality Date   APPENDECTOMY     AUGMENTATION MAMMAPLASTY Bilateral    Patient doesn't remember date   BACK SURGERY     L4-L5   BREAST SURGERY     augmentation   CARDIAC CATHETERIZATION N/A 03/08/2016   Procedure: Left Heart Cath and Coronary Angiography;  Surgeon: Gordy Bergamo, MD;  Location: [facility] INVASIVE CV LAB;  Service: Cardiovascular;  Laterality: N/A;   CARDIAC CATHETERIZATION N/A 03/08/2016   Procedure: Coronary Stent Intervention;  Surgeon: Gordy Bergamo, MD;  Location: [facility] INVASIVE CV LAB;  Service: Cardiovascular;  Laterality: N/A;  Resolute 3.5x15   CARDIAC CATHETERIZATION N/A 03/08/2016   Procedure: Intravascular Ultrasound/IVUS;  Surgeon: Gordy Bergamo, MD;  Location: [facility] INVASIVE CV LAB;  Service: Cardiovascular;  Laterality: N/A;  RCA MID   CESAREAN SECTION     x2   COLONOSCOPY     CORONARY ANGIOGRAM  03/08/2016   CORONARY ANGIOPLASTY     DES LAD, DES D1 02/16/16 [place] [place]), DES RCA 03/08/16 [facility])   LAMINECTOMY WITH POSTERIOR LATERAL ARTHRODESIS LEVEL 2 N/A 03/07/2017   Procedure: Lumbar Four-Five, Lumbar Five-Sacral One Posterior Lateral Fusion with Removal of pedicle screws;  Surgeon: Alm GORMAN Molt, MD;  Location: [facility] OR;  Service: Neurosurgery;  Laterality: N/A;    Family History  Problem Relation Age of Onset   Cancer Mother        pancreatic   Congestive Heart Failure Father    Heart attack Brother        Had blockage, almost a HA   Heart disease Brother    Cancer Maternal Uncle        pancreatic   Cancer Maternal Grandmother        pancreatic  Breast cancer Neg Hx     Allergies[1]  Medications Ordered Prior to Encounter[2]  BP 118/62   Pulse 66   Temp 98.2 F (36.8 C) (Oral)   Ht 5' 4 (1.626 m)   Wt 158 lb (71.7 kg)   SpO2 99%   BMI 27.12 kg/m        Objective:   Physical Exam Vitals and nursing note  reviewed.  Constitutional:      General: She is not in acute distress.    Appearance: Normal appearance. She is not ill-appearing.  HENT:     Head: Normocephalic and atraumatic.     Right Ear: Tympanic membrane, ear canal and external ear normal. There is no impacted cerumen.     Left Ear: Tympanic membrane, ear canal and external ear normal. There is no impacted cerumen.     Nose: Nose normal. No congestion or rhinorrhea.     Mouth/Throat:     Mouth: Mucous membranes are moist.     Pharynx: Oropharynx is clear.  Eyes:     Extraocular Movements: Extraocular movements intact.     Right eye: Nystagmus (horizontal) present.     Left eye: Nystagmus (Horizontal) present.     Conjunctiva/sclera: Conjunctivae normal.     Pupils: Pupils are equal, round, and reactive to light.  Neck:     Vascular: No carotid bruit.  Cardiovascular:     Rate and Rhythm: Normal rate and regular rhythm.     Pulses: Normal pulses.     Heart sounds: No murmur heard.    No friction rub. No gallop.  Pulmonary:     Effort: Pulmonary effort is normal.     Breath sounds: Normal breath sounds.  Abdominal:     General: Abdomen is flat. Bowel sounds are normal. There is no distension.     Palpations: Abdomen is soft. There is no mass.     Tenderness: There is no abdominal tenderness. There is no guarding or rebound.     Hernia: No hernia is present.  Musculoskeletal:        General: Normal range of motion.     Cervical back: Normal range of motion and neck supple.  Lymphadenopathy:     Cervical: No cervical adenopathy.  Skin:    General: Skin is warm and dry.     Capillary Refill: Capillary refill takes less than 2 seconds.  Neurological:     General: No focal deficit present.     Mental Status: She is alert and oriented to person, place, and time.     Cranial Nerves: Cranial nerves 2-12 are intact.     Sensory: Sensation is intact.     Motor: Motor function is intact.     Coordination: Coordination is  intact.     Gait: Gait is intact.  Psychiatric:        Mood and Affect: Mood normal.        Behavior: Behavior normal.        Thought Content: Thought content normal.        Judgment: Judgment normal.       Assessment & Plan:  1. Routine general medical examination at a health care facility (Primary) Today patient counseled on age appropriate routine health concerns for screening and prevention, each reviewed and up to date or declined. Immunizations reviewed and up to date or declined. Labs ordered and reviewed. Risk factors for depression reviewed and negative. Hearing function and visual acuity are intact. ADLs screened and addressed as  needed. Functional ability and level of safety reviewed and appropriate. Education, counseling and referrals performed based on assessed risks today. Patient provided with a copy of personalized plan for preventive services.   2. Coronary artery disease of native artery of native heart with stable angina pectoris - Per cardiology  - CBC with Differential/Platelet; Future - Comprehensive metabolic panel with GFR; Future - Lipid panel; Future - TSH; Future - TSH - Lipid panel - Comprehensive metabolic panel with GFR  3. Primary hypertension - well controlled. No change in medication  - CBC with Differential/Platelet; Future - Comprehensive metabolic panel with GFR; Future - Lipid panel; Future - TSH; Future - TSH - Lipid panel - Comprehensive metabolic panel with GFR  4. Anxiety  - Ambulatory referral to Psychiatry  5. Recurrent major depressive disorder, in full remission  - Ambulatory referral to Psychiatry  6. Prediabetes - Consider increasing Metformin   - Hemoglobin A1c; Future - Hemoglobin A1c  7. Osteopenia of multiple sites - She would like to hold off on bone density screening this year   8. Chronic midline low back pain without sciatica - Continue Celebre   9. Gait instability - Will order MRI of the brain and refer to  PT. Possible vertigo involvement  - MR Brain Wo Contrast; Future - Ambulatory referral to Physical Therapy  10. Cognitive impairment - MMSE was reassuring but will order lab work and MRI of the brain to r/o other causes. Consider referral to Neuropsych     11/25/2024    8:30 AM  MMSE - Mini Mental State Exam  Orientation to time 5  Orientation to Place 5  Registration 3  Attention/ Calculation 5  Recall 3  Language- name 2 objects 2  Language- repeat 1  Language- follow 3 step command 3  Language- read & follow direction 1  Write a sentence 1  Copy design 1  Total score 30    - Vitamin B12; Future - Urinalysis; Future - VITAMIN D  25 Hydroxy (Vit-D Deficiency, Fractures); Future - VITAMIN D  25 Hydroxy (Vit-D Deficiency, Fractures) - Vitamin B12 - Urinalysis - MR Brain Wo Contrast; Future  Darleene Shape, NP     [1] No Known Allergies [2]  Current Outpatient Medications on File Prior to Visit  Medication Sig Dispense Refill   aspirin  81 MG tablet Take 81 mg by mouth daily.      buPROPion (WELLBUTRIN XL) 300 MG 24 hr tablet Take 300 mg by mouth every morning.     calcium -vitamin D  (OSCAL WITH D) 250-125 MG-UNIT tablet Take 4 tablets by mouth daily. Pt is taking 2 tabs in the am and 2 tabs in the pm     celecoxib  (CELEBREX ) 200 MG capsule TAKE 1 CAPSULE BY MOUTH EVERY DAY 90 capsule 1   FLUoxetine  (PROZAC ) 40 MG capsule TAKE 2 CAPSULES (80 MG TOTAL) BY MOUTH EVERY EVENING. 180 capsule 1   Melatonin 10 MG TABS Take 10 mg by mouth at bedtime.     metFORMIN  (GLUCOPHAGE ) 500 MG tablet TAKE 1/2 TABLET BY MOUTH DAILY WITH BREAKFAST 45 tablet 1   niacin  (NIASPAN ) 1000 MG CR tablet TAKE 1 TABLET BY MOUTH TWICE A DAY 180 tablet 1   Probiotic Product (PROBIOTIC DAILY PO) Take 1 capsule by mouth daily.     ramipril  (ALTACE ) 2.5 MG capsule TAKE 1 CAPSULE BY MOUTH EVERY DAY 90 capsule 3   rosuvastatin  (CRESTOR ) 20 MG tablet TAKE 1 TABLET BY MOUTH EVERY DAY 90 tablet 3   metoprolol   succinate (TOPROL -XL) 25 MG 24 hr tablet TAKE 1 TABLET BY MOUTH EVERY DAY WITH OR IMMEDIATELY FOLLOWING A MEAL (Patient not taking: Reported on 11/25/2024) 90 tablet 3   No current facility-administered medications on file prior to visit.   "

## 2024-11-26 ENCOUNTER — Ambulatory Visit: Payer: Self-pay | Admitting: Adult Health

## 2024-11-26 ENCOUNTER — Other Ambulatory Visit: Payer: Self-pay | Admitting: Adult Health

## 2024-11-26 MED ORDER — CIPROFLOXACIN HCL 500 MG PO TABS: 500.0000 mg | ORAL_TABLET | Freq: Two times a day (BID) | ORAL | 0 refills | Status: AC

## 2024-11-27 ENCOUNTER — Other Ambulatory Visit: Payer: Self-pay | Admitting: Adult Health

## 2024-11-27 ENCOUNTER — Other Ambulatory Visit: Payer: Self-pay | Admitting: Cardiology

## 2024-11-27 DIAGNOSIS — I2510 Atherosclerotic heart disease of native coronary artery without angina pectoris: Secondary | ICD-10-CM

## 2024-11-27 DIAGNOSIS — E7841 Elevated Lipoprotein(a): Secondary | ICD-10-CM

## 2024-11-30 NOTE — Telephone Encounter (Signed)
 Lipid done on 11/25/24

## 2024-12-01 ENCOUNTER — Encounter: Payer: Self-pay | Admitting: Adult Health

## 2024-12-01 ENCOUNTER — Ambulatory Visit: Admitting: Rehabilitative and Restorative Service Providers"

## 2024-12-09 ENCOUNTER — Other Ambulatory Visit: Payer: Self-pay | Admitting: Adult Health

## 2024-12-09 ENCOUNTER — Other Ambulatory Visit

## 2024-12-09 DIAGNOSIS — F3342 Major depressive disorder, recurrent, in full remission: Secondary | ICD-10-CM

## 2024-12-18 ENCOUNTER — Other Ambulatory Visit

## 2024-12-22 ENCOUNTER — Other Ambulatory Visit

## 2025-08-05 ENCOUNTER — Ambulatory Visit

## 2025-08-10 ENCOUNTER — Ambulatory Visit
